# Patient Record
Sex: Female | Born: 1980 | Race: Black or African American | Hispanic: No | Smoking: Never smoker
Health system: Southern US, Community
[De-identification: ages and names within clinical notes are randomized; demographics above are authoritative.]

## PROBLEM LIST (undated history)

## (undated) ENCOUNTER — Inpatient Hospital Stay (HOSPITAL_COMMUNITY): Payer: Self-pay

## (undated) DIAGNOSIS — T4145XA Adverse effect of unspecified anesthetic, initial encounter: Secondary | ICD-10-CM

## (undated) DIAGNOSIS — T8859XA Other complications of anesthesia, initial encounter: Secondary | ICD-10-CM

## (undated) DIAGNOSIS — IMO0002 Reserved for concepts with insufficient information to code with codable children: Secondary | ICD-10-CM

## (undated) DIAGNOSIS — M1990 Unspecified osteoarthritis, unspecified site: Secondary | ICD-10-CM

## (undated) DIAGNOSIS — E282 Polycystic ovarian syndrome: Secondary | ICD-10-CM

## (undated) DIAGNOSIS — K219 Gastro-esophageal reflux disease without esophagitis: Secondary | ICD-10-CM

## (undated) DIAGNOSIS — L309 Dermatitis, unspecified: Secondary | ICD-10-CM

## (undated) DIAGNOSIS — I10 Essential (primary) hypertension: Secondary | ICD-10-CM

## (undated) DIAGNOSIS — D649 Anemia, unspecified: Secondary | ICD-10-CM

## (undated) DIAGNOSIS — R87619 Unspecified abnormal cytological findings in specimens from cervix uteri: Secondary | ICD-10-CM

## (undated) DIAGNOSIS — L80 Vitiligo: Secondary | ICD-10-CM

## (undated) HISTORY — PX: ADENOIDECTOMY: SUR15

## (undated) HISTORY — DX: Other complications of anesthesia, initial encounter: T88.59XA

## (undated) HISTORY — DX: Vitiligo: L80

## (undated) HISTORY — DX: Unspecified abnormal cytological findings in specimens from cervix uteri: R87.619

## (undated) HISTORY — DX: Gastro-esophageal reflux disease without esophagitis: K21.9

## (undated) HISTORY — DX: Adverse effect of unspecified anesthetic, initial encounter: T41.45XA

## (undated) HISTORY — PX: GASTRIC BYPASS: SHX52

## (undated) HISTORY — DX: Reserved for concepts with insufficient information to code with codable children: IMO0002

## (undated) HISTORY — PX: ABDOMINAL HYSTERECTOMY: SHX81

## (undated) HISTORY — PX: DILATION AND CURETTAGE OF UTERUS: SHX78

---
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
## 2005-09-15 ENCOUNTER — Emergency Department: Payer: Self-pay | Admitting: Internal Medicine

## 2006-02-01 ENCOUNTER — Emergency Department: Payer: Self-pay | Admitting: Emergency Medicine

## 2009-01-14 ENCOUNTER — Emergency Department: Payer: Self-pay | Admitting: Emergency Medicine

## 2012-01-19 ENCOUNTER — Emergency Department (HOSPITAL_COMMUNITY)
Admission: EM | Admit: 2012-01-19 | Discharge: 2012-01-19 | Disposition: A | Discharge status: Home / Self Care | Payer: Self-pay | Attending: Emergency Medicine | Admitting: Emergency Medicine

## 2012-01-19 ENCOUNTER — Encounter (HOSPITAL_COMMUNITY): Payer: Self-pay | Admitting: *Deleted

## 2012-01-19 DIAGNOSIS — R1012 Left upper quadrant pain: Secondary | ICD-10-CM | POA: Insufficient documentation

## 2012-01-19 DIAGNOSIS — K219 Gastro-esophageal reflux disease without esophagitis: Secondary | ICD-10-CM | POA: Insufficient documentation

## 2012-01-19 DIAGNOSIS — R1013 Epigastric pain: Secondary | ICD-10-CM | POA: Insufficient documentation

## 2012-01-19 DIAGNOSIS — R112 Nausea with vomiting, unspecified: Secondary | ICD-10-CM | POA: Insufficient documentation

## 2012-01-19 DIAGNOSIS — R109 Unspecified abdominal pain: Secondary | ICD-10-CM

## 2012-01-19 LAB — URINALYSIS, ROUTINE W REFLEX MICROSCOPIC
Glucose, UA: NEGATIVE mg/dL
Hgb urine dipstick: NEGATIVE
Leukocytes, UA: NEGATIVE
Specific Gravity, Urine: 1.016 (ref 1.005–1.030)
pH: 6 (ref 5.0–8.0)

## 2012-01-19 MED ORDER — SUCRALFATE 1 G PO TABS
1.0000 g | ORAL_TABLET | ORAL | Status: AC
  Administered 2012-01-19: 1 g via ORAL
  Filled 2012-01-19: qty 1

## 2012-01-19 MED ORDER — FAMOTIDINE 40 MG PO TABS: 40.0000 mg | ORAL_TABLET | Freq: Once | ORAL | Status: DC

## 2012-01-19 MED ORDER — FAMOTIDINE 20 MG PO TABS
40.0000 mg | ORAL_TABLET | Freq: Once | ORAL | Status: AC
  Administered 2012-01-19: 40 mg via ORAL
  Filled 2012-01-19: qty 2

## 2012-01-19 MED ORDER — GI COCKTAIL ~~LOC~~
30.0000 mL | Freq: Once | ORAL | Status: AC
  Administered 2012-01-19: 30 mL via ORAL
  Filled 2012-01-19: qty 30

## 2012-01-19 MED ORDER — SUCRALFATE 1 G PO TABS: 1.0000 g | ORAL_TABLET | ORAL | Status: DC

## 2012-01-19 NOTE — Discharge Instructions (Signed)
Diet for GERD or PUD  Nutrition therapy can help ease the discomfort of gastroesophageal reflux disease (GERD) and peptic ulcer disease (PUD).   HOME CARE INSTRUCTIONS    Eat your meals slowly, in a relaxed setting.   Eat 5 to 6 small meals per day.   If a food causes distress, stop eating it for a period of time.  FOODS TO AVOID   Coffee, regular or decaffeinated.   Cola beverages, regular or low calorie.   Tea, regular or decaffeinated.   Pepper.   Cocoa.   High fat foods, including meats.   Butter, margarine, hydrogenated oil (trans fats).   Peppermint or spearmint (if you have GERD).   Fruits and vegetables if not tolerated.   Alcohol.   Nicotine (smoking or chewing). This is one of the most potent stimulants to acid production in the gastrointestinal tract.   Any food that seems to aggravate your condition.  If you have questions regarding your diet, ask your caregiver or a registered dietitian.  TIPS   Lying flat may make symptoms worse. Keep the head of your bed raised 6 to 9 inches (15 to 23 cm) by using a foam wedge or blocks under the legs of the bed.   Do not lay down until 3 hours after eating a meal.   Daily physical activity may help reduce symptoms.  MAKE SURE YOU:    Understand these instructions.   Will watch your condition.   Will get help right away if you are not doing well or get worse.  Document Released: 10/11/2005 Document Revised: 09/30/2011 Document Reviewed: 08/27/2011  ExitCare Patient Information 2012 [employer].    Gastroesophageal Reflux Disease, Adult  Gastroesophageal reflux disease (GERD) happens when acid from your stomach flows up into the esophagus. When acid comes in contact with the esophagus, the acid causes soreness (inflammation) in the esophagus. Over time, GERD may create small holes (ulcers) in the lining of the esophagus.  CAUSES    Increased body weight. This puts pressure on the stomach, making acid rise from the stomach into the  esophagus.   Smoking. This increases acid production in the stomach.   Drinking alcohol. This causes decreased pressure in the lower esophageal sphincter (valve or ring of muscle between the esophagus and stomach), allowing acid from the stomach into the esophagus.   Late evening meals and a full stomach. This increases pressure and acid production in the stomach.   A malformed lower esophageal sphincter.  Sometimes, no cause is found.  SYMPTOMS    Burning pain in the lower part of the mid-chest behind the breastbone and in the mid-stomach area. This may occur twice a week or more often.   Trouble swallowing.   Sore throat.   Dry cough.   Asthma-like symptoms including chest tightness, shortness of breath, or wheezing.  DIAGNOSIS   Your caregiver may be able to diagnose GERD based on your symptoms. In some cases, X-rays and other tests may be done to check for complications or to check the condition of your stomach and esophagus.  TREATMENT   Your caregiver may recommend over-the-counter or prescription medicines to help decrease acid production. Ask your caregiver before starting or adding any new medicines.   HOME CARE INSTRUCTIONS    Change the factors that you can control. Ask your caregiver for guidance concerning weight loss, quitting smoking, and alcohol consumption.   Avoid foods and drinks that make your symptoms worse, such as:   Caffeine or   alcoholic drinks.   Chocolate.   Peppermint or mint flavorings.   Garlic and onions.   Spicy foods.   Citrus fruits, such as oranges, lemons, or limes.   Tomato-based foods such as sauce, chili, salsa, and pizza.   Fried and fatty foods.   Avoid lying down for the 3 hours prior to your bedtime or prior to taking a nap.   Eat small, frequent meals instead of large meals.   Wear loose-fitting clothing. Do not wear anything tight around your waist that causes pressure on your stomach.   Raise the head of your bed 6 to 8 inches with wood blocks to  help you sleep. Extra pillows will not help.   Only take over-the-counter or prescription medicines for pain, discomfort, or fever as directed by your caregiver.   Do not take aspirin, ibuprofen, or other nonsteroidal anti-inflammatory drugs (NSAIDs).  SEEK IMMEDIATE MEDICAL CARE IF:    You have pain in your arms, neck, jaw, teeth, or back.   Your pain increases or changes in intensity or duration.   You develop nausea, vomiting, or sweating (diaphoresis).   You develop shortness of breath, or you faint.   Your vomit is green, yellow, black, or looks like coffee grounds or blood.   Your stool is red, bloody, or black.  These symptoms could be signs of other problems, such as heart disease, gastric bleeding, or esophageal bleeding.  MAKE SURE YOU:    Understand these instructions.   Will watch your condition.   Will get help right away if you are not doing well or get worse.  Document Released: 07/21/2005 Document Revised: 09/30/2011 Document Reviewed: 04/30/2011  Exit[employer] Patient Information 2012 [employer].

## 2012-01-19 NOTE — ED Notes (Signed)
Pt is here with mid upper abd pain that started one month ago and then had diarrhea last week.  tp sts then last night had pain with nausea.  LMP- 5 days ago.

## 2012-01-19 NOTE — ED Provider Notes (Signed)
History     CSN: [id number]  Arrival date & time 01/19/12  1205   First MD Initiated Contact with Patient 01/19/12 1537      Chief Complaint  Patient presents with  . Abdominal Pain    (Consider location/radiation/quality/duration/timing/severity/associated sxs/prior treatment) Patient is a 31 y.o. female presenting with abdominal pain. The history is provided by the patient.  Abdominal Pain The primary symptoms of the illness include abdominal pain.   with recurrent epigastric pain radiating to her left upper quadrant. Pain is better with food. No associated fever or chills. Some nausea without vomiting. No vaginal bleeding or discharge. No urinary symptoms. No medications used prior to arrival.  History reviewed. No pertinent past medical history.  Past Surgical History  Procedure Date  . Tonsillectomy     No family history on file.  History  Substance Use Topics  . Smoking status: Never Smoker   . Smokeless tobacco: Not on file  . Alcohol Use: No    OB History    Grav Para Term Preterm Abortions TAB SAB Ect Mult Living                  Review of Systems  Gastrointestinal: Positive for abdominal pain.  All other systems reviewed and are negative.    Allergies  Review of patient's allergies indicates no known allergies.  Home Medications   Current Outpatient Rx  Name Route Sig Dispense Refill  . BIOTIN PO Oral Take 1 tablet by mouth daily.    . MSM PO Oral Take 15 mLs by mouth daily. Mix 1 tablespoon with water.    Marland Kitchen OVER THE COUNTER MEDICATION Oral Take 2 tablets by mouth daily. Vegetarian Multi Vitamin.      BP 144/94  Pulse 78  Temp(Src) 98.1 F (36.7 C) (Oral)  Resp 16  SpO2 98%  Physical Exam  Nursing note and vitals reviewed. Constitutional: She is oriented to person, place, and time. She appears well-developed and well-nourished.  Non-toxic appearance. No distress.  HENT:  Head: Normocephalic and atraumatic.  Eyes: Conjunctivae, EOM and  lids are normal. Pupils are equal, round, and reactive to light.  Neck: Normal range of motion. Neck supple. No tracheal deviation present. No mass present.  Cardiovascular: Normal rate, regular rhythm and normal heart sounds.  Exam reveals no gallop.   No murmur heard. Pulmonary/Chest: Effort normal and breath sounds normal. No stridor. No respiratory distress. She has no decreased breath sounds. She has no wheezes. She has no rhonchi. She has no rales.  Abdominal: Soft. Normal appearance and bowel sounds are normal. She exhibits no distension. There is tenderness in the epigastric area. There is no rigidity, no rebound, no guarding and no CVA tenderness.  Musculoskeletal: Normal range of motion. She exhibits no edema and no tenderness.  Neurological: She is alert and oriented to person, place, and time. She has normal strength. No cranial nerve deficit or sensory deficit. GCS eye subscore is 4. GCS verbal subscore is 5. GCS motor subscore is 6.  Skin: Skin is warm and dry. No abrasion and no rash noted.  Psychiatric: She has a normal mood and affect. Her speech is normal and behavior is normal.    ED Course  Procedures (including critical care time)   Labs Reviewed  URINALYSIS, ROUTINE W REFLEX MICROSCOPIC   No results found.   No diagnosis found.    MDM  Patient given GI cocktail now and she feels better. Patient to be placed on a PPI  and Carafate        Toy Baker, MD 01/19/12 1815

## 2012-01-20 LAB — POCT PREGNANCY, URINE: Preg Test, Ur: NEGATIVE

## 2012-02-11 ENCOUNTER — Ambulatory Visit (INDEPENDENT_AMBULATORY_CARE_PROVIDER_SITE_OTHER): Payer: Self-pay | Admitting: Obstetrics and Gynecology

## 2012-02-11 ENCOUNTER — Encounter: Payer: Self-pay | Admitting: Obstetrics and Gynecology

## 2012-02-11 VITALS — BP 148/97 | HR 56 | Temp 98.5°F | Ht 68.5 in | Wt 317.1 lb

## 2012-02-11 DIAGNOSIS — N979 Female infertility, unspecified: Secondary | ICD-10-CM

## 2012-02-11 DIAGNOSIS — N915 Oligomenorrhea, unspecified: Secondary | ICD-10-CM

## 2012-02-11 DIAGNOSIS — I10 Essential (primary) hypertension: Secondary | ICD-10-CM | POA: Insufficient documentation

## 2012-02-11 DIAGNOSIS — E669 Obesity, unspecified: Secondary | ICD-10-CM

## 2012-02-11 MED ORDER — METFORMIN HCL 500 MG PO TABS: 500.0000 mg | ORAL_TABLET | ORAL | Status: DC

## 2012-02-11 NOTE — Patient Instructions (Signed)
Polycystic Ovarian Syndrome Polycystic ovarian syndrome is a condition with a number of problems. One problem is with the ovaries. The ovaries are organs located in the female pelvis, on each side of the uterus. Usually, during the menstrual cycle, an egg is released from 1 ovary every month. This is called ovulation. When the egg is fertilized, it goes into the womb (uterus), which allows for the growth of a baby. The egg travels from the ovary through the fallopian tube to the uterus. The ovaries also make the hormones estrogen and progesterone. These hormones help the development of a woman's breasts, body shape, and body hair. They also regulate the menstrual cycle and pregnancy. Sometimes, cysts form in the ovaries. A cyst is a fluid-filled sac. On the ovary, different types of cysts can form. The most common type of ovarian cyst is called a functional or ovulation cyst. It is normal, and often forms during the normal menstrual cycle. Each month, a woman's ovaries grow tiny cysts that hold the eggs. When an egg is fully grown, the sac breaks open. This releases the egg. Then, the sac which released the egg from the ovary dissolves. In one type of functional cyst, called a follicle cyst, the sac does not break open to release the egg. It may actually continue to grow. This type of cyst usually disappears within 1 to 3 months.  One type of cyst problem with the ovaries is called Polycystic Ovarian Syndrome (PCOS). In this condition, many follicle cysts form, but do not rupture and produce an egg. This health problem can affect the following:  Menstrual cycle.   Heart.   Obesity.   Cancer of the uterus.   Fertility.   Blood vessels.   Hair growth (face and body) or baldness.   Hormones.   Appearance.   High blood pressure.   Stroke.   Insulin production.   Inflammation of the liver.   Elevated blood cholesterol and triglycerides.  CAUSES   No one knows the exact cause of PCOS.    Women with PCOS often have a mother or sister with PCOS. There is not yet enough proof to say this is inherited.   Many women with PCOS have a weight problem.   Researchers are looking at the relationship between PCOS and the body's ability to make insulin. Insulin is a hormone that regulates the change of sugar, starches, and other food into energy for the body's use, or for storage. Some women with PCOS make too much insulin. It is possible that the ovaries react by making too many female hormones, called androgens. This can lead to acne, excessive hair growth, weight gain, and ovulation problems.   Too much production of luteinizing hormone (LH) from the pituitary gland in the brain stimulates the ovary to produce too much female hormone (androgen).  SYMPTOMS   Infrequent or no menstrual periods, and/or irregular bleeding.   Inability to get pregnant (infertility), because of not ovulating.   Increased growth of hair on the face, chest, stomach, back, thumbs, thighs, or toes.   Acne, oily skin, or dandruff.   Pelvic pain.   Weight gain or obesity, usually carrying extra weight around the waist.   Type 2 diabetes (this is the diabetes that usually does not need insulin).   High cholesterol.   High blood pressure.   Female-pattern baldness or thinning hair.   Patches of thickened and dark brown or black skin on the neck, arms, breasts, or thighs.   Skin tags,   or tiny excess flaps of skin, in the armpits or neck area.   Sleep apnea (excessive snoring and breathing stops at times while asleep).   Deepening of the voice.   Gestational diabetes when pregnant.   Increased risk of miscarriage with pregnancy.  DIAGNOSIS  There is no single test to diagnose PCOS.   Your caregiver will:   Take a medical history.   Perform a pelvic exam.   Perform an ultrasound.   Check your female and female hormone levels.   Measure glucose or sugar levels in the blood.   Do other blood  tests.   If you are producing too many female hormones, your caregiver will make sure it is from PCOS. At the physical exam, your caregiver will want to evaluate the areas of increased hair growth. Try to allow natural hair growth for a few days before the visit.   During a pelvic exam, the ovaries may be enlarged or swollen by the increased number of small cysts. This can be seen more easily by vaginal ultrasound or screening, to examine the ovaries and lining of the uterus (endometrium) for cysts. The uterine lining may become thicker, if there has not been a regular period.  TREATMENT  Because there is no cure for PCOS, it needs to be managed to prevent problems. Treatments are based on your symptoms. Treatment is also based on whether you want to have a baby or whether you need contraception.  Treatment may include:  Progesterone hormone, to start a menstrual period.   Birth control pills, to make you have regular menstrual periods.   Medicines to make you ovulate, if you want to get pregnant.   Medicines to control your insulin.   Medicine to control your blood pressure.   Medicine and diet, to control your high cholesterol and triglycerides in your blood.   Surgery, making small holes in the ovary, to decrease the amount of female hormone production. This is done through a long, lighted tube (laparoscope), placed into the pelvis through a tiny incision in the lower abdomen.  Your caregiver will go over some of the choices with you. WOMEN WITH PCOS HAVE THESE CHARACTERISTICS:  High levels of female hormones called androgens.   An irregular or no menstrual cycle.   May have many small cysts in their ovaries.  PCOS is the most common hormonal reproductive problem in women of childbearing age. WHY DO WOMEN WITH PCOS HAVE TROUBLE WITH THEIR MENSTRUAL CYCLE? Each month, about 20 eggs start to mature in the ovaries. As one egg grows and matures, the follicle breaks open to release the egg,  so it can travel through the fallopian tube for fertilization. When the single egg leaves the follicle, ovulation takes place. In women with PCOS, the ovary does not make all of the hormones it needs for any of the eggs to fully mature. They may start to grow and accumulate fluid, but no one egg becomes large enough. Instead, some may remain as cysts. Since no egg matures or is released, ovulation does not occur and the hormone progesterone is not made. Without progesterone, a woman's menstrual cycle is irregular or absent. Also, the cysts produce female hormones, which continue to prevent ovulation.  Document Released: 02/04/2005 Document Revised: 09/30/2011 Document Reviewed: 08/29/2009 [employer] Patient Information 2012 [employer]. 

## 2012-02-11 NOTE — Progress Notes (Signed)
Brought "my prescription for good health " bp at [facility] 135/81

## 2012-02-11 NOTE — Progress Notes (Addendum)
Patient ID: Vanessa Braun, female     DOB: 1981-04-13, 31 y.o.   MRN: [id number] Vanessa Braun [id number]  Chief Complaint  Patient presents with  . Follow-up    possible PCOS seen at [facility]        SUBJECTIVE  HPI: This is the initial visit for patient she was seen 01/07/2012 at [facility] for annual GYN visit with Pap and advised to come here for evaluation for possible PCOS. States she had a fasting blood glucose of 87 at that visit. Menstrual history 13x 30x5-7; followed by menstrual irregularity described as heavy flow every 3-5 months for several years. She was on Depo-Provera for about a year ending in 2009. Was then amenorrheic for 5 months. Since then cycle intervals have been approximately 50 days. LMP and 02/01/2012 seven-day flow. And her husband have been attempting to get pregnant for the past 3 years. Has no other children and has had no semen analysis. She has not been diagnosed before as hypertensive however she has been told her blood pressures are "a little high." A t [facility] visit 01/19/2012  BP 144/94. She states she plucks some chin hairs but does not have a problem with hirsutism. He is interested in losing weight and plans to increase exercise.  Past Medical History  Diagnosis Date  . GERD (gastroesophageal reflux disease)   . Abnormal Pap smear    Past Surgical History  Procedure Date  . Tonsillectomy and adenoidectomy 1999   History   Social History  . Marital Status: Married    Spouse Name: N/A    Number of Children: N/A  . Years of Education: N/A   Occupational History  . Not on file.   Social History Main Topics  . Smoking status: Never Smoker   . Smokeless tobacco: Never Used  . Alcohol Use: No  . Drug Use: No  . Sexually Active: Yes    Birth Control/ Protection: None   Other Topics Concern  . Not on file   Social History Narrative  . No narrative on file   Current Outpatient Prescriptions on File Prior to Visit  Medication Sig Dispense Refill  .  BIOTIN PO Take 1 tablet by mouth daily.      . famotidine (PEPCID) 40 MG tablet Take 1 tablet (40 mg total) by mouth once.  30 tablet  0  . Methylsulfonylmethane (MSM PO) Take 15 mLs by mouth daily. Mix 1 tablespoon with water.      Marland Kitchen OVER THE COUNTER MEDICATION Take 2 tablets by mouth daily. Vegetarian Multi Vitamin.      . metFORMIN (GLUCOPHAGE) 500 MG tablet Take 1 tablet (500 mg total) by mouth 1 day or 1 dose.  30 tablet  3   No Known Allergies  ROS: Pertinent items in HPI  OBJECTIVE Blood pressure 148/97, pulse 56, temperature 98.5 F (36.9 C), temperature source Oral, height 5' 8.5" (1.74 m), weight 317 lb 1.6 oz (143.836 kg), last menstrual period 02/01/2012. GENERAL: Well-developed, well-nourished female in no acute distress.  ABDOMEN: Soft, nontender, truncal obesity, no hirsutism EXTREMITIES: Nontender, no edema BIMANUAL: cervix post L/C; uterus not able to outine due to body habitus, NT; no adnexal tenderness or masses appreciated    ASSESSMENT 1. Morbid Obesity 2. CHTN new dx 3. Oligomenorrhea 4. Primary Infertility  PLAN D/W Dr. Marice Potter -> will check TSH and total free testosterone today and prescribe Metformin to start at 500 mg 1/d and gradually increase to 1 gm bid Will refer to [facility] to  get PMD to manage HTN Discussed weight loss, diet, exercise Advised to have husband get semenanalysis F/U here in 2 months and keep menstrual calendar   ROI record [facility]    Vanessa Braun 02/11/2012 9:36 AM

## 2012-02-14 LAB — TESTOSTERONE, FREE, TOTAL, SHBG
Sex Hormone Binding: 31 nmol/L (ref 18–114)
Testosterone, Free: 8 pg/mL — ABNORMAL HIGH (ref 0.6–6.8)
Testosterone-% Free: 1.9 % (ref 0.4–2.4)
Testosterone: 42.84 ng/dL (ref 10–70)

## 2012-02-16 ENCOUNTER — Telehealth: Payer: Self-pay | Admitting: *Deleted

## 2012-02-16 NOTE — Telephone Encounter (Signed)
Pt called requesting lab results. Will forward to provider for review.

## 2012-02-21 ENCOUNTER — Telehealth: Payer: Self-pay

## 2012-02-21 NOTE — Telephone Encounter (Signed)
Pt called the front desk and call was transferred to me.  Pt wanted to know her results I informed her that her results does coincide with PCOS and to make sure she continues the metformin regimen as Deidre prescribed in prior visit.  Pt shared a concern for the Rx of clomid and I advised pt that the metformin should keep her hormones stable and that if she needed to make an infertility appt it would cost 250 up front.  But to stick with regimen and follow up in 2 months.  Pt stated understanding and had no further questions.

## 2012-02-25 NOTE — Telephone Encounter (Signed)
I called her re: results. Will take the Metformin and RTC 2 months

## 2012-03-21 ENCOUNTER — Other Ambulatory Visit: Payer: Self-pay | Admitting: Obstetrics and Gynecology

## 2012-03-24 ENCOUNTER — Other Ambulatory Visit: Payer: Self-pay | Admitting: *Deleted

## 2012-03-24 DIAGNOSIS — I10 Essential (primary) hypertension: Secondary | ICD-10-CM

## 2012-03-24 DIAGNOSIS — N915 Oligomenorrhea, unspecified: Secondary | ICD-10-CM

## 2012-03-24 NOTE — Telephone Encounter (Signed)
May refill metformin 6 refills

## 2012-03-24 NOTE — Telephone Encounter (Signed)
Patient came to  Clinic states needs refill on metformin because will run out of it over the weekend, pt now taking 2 tablets bid,   Per chart review seen by Leola Brazil , discussed with Dr. Debroah Loop and sent to him for review and refill if approved.will notify patient if not approved.  Patient voices understanding,

## 2012-03-25 MED ORDER — METFORMIN HCL 500 MG PO TABS: 500.0000 mg | ORAL_TABLET | Freq: Two times a day (BID) | ORAL | Status: DC

## 2012-03-31 ENCOUNTER — Encounter: Payer: Self-pay | Admitting: Obstetrics and Gynecology

## 2012-04-13 ENCOUNTER — Ambulatory Visit (INDEPENDENT_AMBULATORY_CARE_PROVIDER_SITE_OTHER): Payer: Self-pay | Admitting: Advanced Practice Midwife

## 2012-04-13 VITALS — BP 124/78 | HR 63 | Temp 97.5°F | Wt 313.4 lb

## 2012-04-13 DIAGNOSIS — N979 Female infertility, unspecified: Secondary | ICD-10-CM

## 2012-04-13 DIAGNOSIS — I10 Essential (primary) hypertension: Secondary | ICD-10-CM

## 2012-04-13 DIAGNOSIS — N915 Oligomenorrhea, unspecified: Secondary | ICD-10-CM

## 2012-04-13 MED ORDER — MEDROXYPROGESTERONE ACETATE 10 MG PO TABS: 10.0000 mg | ORAL_TABLET | Freq: Every day | ORAL | Status: DC

## 2012-04-13 MED ORDER — METFORMIN HCL 500 MG PO TABS: 1000.0000 mg | ORAL_TABLET | Freq: Two times a day (BID) | ORAL | Status: DC

## 2012-04-13 MED ORDER — CLOMIPHENE CITRATE 50 MG PO TABS: 50.0000 mg | ORAL_TABLET | Freq: Every day | ORAL | Status: AC

## 2012-04-13 NOTE — Patient Instructions (Addendum)
[facility] [phone]  Start charting your cycles to identify when or if you are ovulating.   Infertility WHAT IS INFERTILITY?  Infertility is usually defined as not being able to get pregnant after trying for one year of regular sexual intercourse without the use of contraceptives. Or not being able to carry a pregnancy to term and have a baby. The infertility rate in the [place] is around 10%. Pregnancy is the result of a chain of events. A woman must release an egg from one of her ovaries (ovulation). The egg must be fertilized by the female sperm. Then it travels through a fallopian tube into the uterus (womb), where it attaches to the wall of the uterus and grows. A man must have enough sperm, and the sperm must join with (fertilize) the egg along the way, at the proper time. The fertilized egg must then become attached to the inside of the uterus. While this may seem simple, many things can happen to prevent pregnancy from occurring.  WHOSE PROBLEM IS IT?  About 20% of infertility cases are due to problems with the man (female factors) and 65% are due to problems with the woman (female factors). Other cases are due to a combination of female and female factors or to unknown causes.  WHAT CAUSES INFERTILITY IN MEN?  Infertility in men is often caused by problems with making enough normal sperm or getting the sperm to reach the egg. Problems with sperm may exist from birth or develop later in life, due to illness or injury. Some men produce no sperm, or produce too few sperm (oligospermia). Other problems include:  Sexual dysfunction.   Hormonal or endocrine problems.   Age. Female fertility decreases with age, but not at as young an age as female fertility.   Infection.   Congenital problems. Birth defect, such as absence of the tubes that carry the sperm (vas deferens).   Genetic/chromosomal problems.   Antisperm antibody problems.   Retrograde ejaculation (sperm go into the  bladder).   Varicoceles, spematoceles, or tumors of the testicles.   Lifestyle can influence the number and quality of a man's sperm.   Alcohol and drugs can temporarily reduce sperm quality.   Environmental toxins, including pesticides and lead, may cause some cases of infertility in men.  WHAT CAUSES INFERTILITY IN WOMEN?   Problems with ovulation account for most infertility in women. Without ovulation, eggs are not available to be fertilized.   Signs of problems with ovulation include irregular menstrual periods or no periods at all.   Simple lifestyle factors, including stress, diet, or athletic training, can affect a woman's hormonal balance.   Age. Fertility begins to decrease in women in the early 62s and is worse after age 44.   Much less often, a hormonal imbalance from a serious medical problem, such as a pituitary gland tumor, thyroid or other chronic medical disease, can cause ovulation problems.   Pelvic infections.   Polycystic ovary syndrome (increase in female hormones, unable to ovulate).   Alcohol or illegal drugs.   Environmental toxins, radiation, pesticides, and certain chemicals.   Aging is an important factor in female infertility.   The ability of a woman's ovaries to produce eggs declines with age, especially after age 28. About one third of couples where the woman is over 35 will have problems with fertility.   By the time she reaches menopause when her monthly periods stop for good, a woman can no longer produce eggs or become  pregnant.   Other problems can also lead to infertility in women. If the fallopian tubes are blocked at one or both ends, the egg cannot travel through the tubes into the uterus. Scar tissue (adhesions) in the pelvis may cause blocked tubes. This may result from pelvic inflammatory disease, endometriosis, or surgery for an ectopic pregnancy (fertilized egg implanted outside the uterus) or any pelvic or abdominal surgery causing  adhesions.   Fibroid tumors or polyps of the uterus.   Congenital (birth defect) abnormalities of the uterus.   Infection of the cervix (cervicitis).   Cervical stenosis (narrowing).   Abnormal cervical mucus.   Polycystic ovary syndrome.   Having sexual intercourse too often (every other day or 4 to 5 times a week).   Obesity.   Anorexia.   Poor nutrition.   Over exercising, with loss of body fat.   DES. Your mother received diethylstilbesterol hormone when pregnant with you.  HOW IS INFERTILITY TESTED?  If you have been trying to have a baby without success, you may want to seek medical help. You should not wait for one year of trying before seeing a health care provider if:  You are over 35.   You have reason to believe that there may be a fertility problem.  A medical evaluation may determine the reasons for a couple's infertility. Usually this process begins with:  Physical exams.   Medical histories of both partners.   Sexual histories of both partners.  If there is no obvious problem, like improperly timed intercourse or absence of ovulation, tests may be needed.   For a man, testing usually begins with tests of his semen to look at:   The number of sperm.   The shape of sperm.   Movement of his sperm.   Taking a complete medical and surgical history.   Physical examination.   Check for infection of the female reproductive organs.  Sometimes hormone tests are done.   For a woman, the first step in testing is to find out if she is ovulating each month. There are several ways to do this. For example, she can keep track of changes in her morning body temperature and in the texture of her cervical mucus. Another tool is a home ovulation test kit, which can be bought at drug or grocery stores.   Checks of ovulation can also be done in the doctor's office, using blood tests for hormone levels or ultrasound tests of the ovaries. If the woman is ovulating, more  tests will need to be done. Some common female tests include:   Hysterosalpingogram: An x-ray of the fallopian tubes and uterus after they are injected with dye. It shows if the tubes are open and shows the shape of the uterus.   Laparoscopy: An exam of the tubes and other female organs for disease. A lighted tube called a laparoscope is used to see inside the abdomen.   Endometrial biopsy: Sample of uterus tissue taken on the first day of the menstrual period, to see if the tissue indicates you are ovulating.   Transvaginal ultrasound: Examines the female organs.   Hysteroscopy: Uses a lighted tube to examine the cervix and inside the uterus, to see if there are any abnormalities inside the uterus.  TREATMENT  Depending on the test results, different treatments can be suggested. The type of treatment depends on the cause. 85 to 90% of infertility cases are treated with drugs or surgery.   Various fertility drugs may be  used for women with ovulation problems. It is important to talk with your caregiver about the drug to be used. You should understand the drug's benefits and side effects. Depending on the type of fertility drug and the dosage of the drug used, multiple births (twins or multiples) can occur in some women.   If needed, surgery can be done to repair damage to a woman's ovaries, fallopian tubes, cervix, or uterus.   Surgery or medical treatment for endometriosis or polycystic ovary syndrome. Sometimes a man has an infertility problem that can be corrected with medicine or by surgery.   Intrauterine insemination (IUI) of sperm, timed with ovulation.   Change in lifestyle, if that is the cause (lose weight, increase exercise, and stop smoking, drinking excessively, or taking illegal drugs).   Other types of surgery:   Removing growths inside and on the uterus.   Removing scar tissue from inside of the uterus.   Fixing blocked tubes.   Removing scar tissue in the pelvis and  around the female organs.  WHAT IS ASSISTED REPRODUCTIVE TECHNOLOGY (ART)?  Assisted reproductive technology (ART) is another form of special methods used to help infertile couples. ART involves handling both the woman's eggs and the man's sperm. Success rates vary and depend on many factors. ART can be expensive and time-consuming. But ART has made it possible for many couples to have children that otherwise would not have been conceived. Some methods are listed below:  In vitro fertilization (IVF). IVF is often used when a woman's fallopian tubes are blocked or when a man has low sperm counts. A drug is used to stimulate the ovaries to produce multiple eggs. Once mature, the eggs are removed and placed in a culture dish with the man's sperm for fertilization. After about 40 hours, the eggs are examined to see if they have become fertilized by the sperm and are dividing into cells. These fertilized eggs (embryos) are then placed in the woman's uterus. This bypasses the fallopian tubes.   Gamete intrafallopian transfer (GIFT) is similar to IVF, but used when the woman has at least one normal fallopian tube. Three to five eggs are placed in the fallopian tube, along with the man's sperm, for fertilization inside the woman's body.   Zygote intrafallopian transfer (ZIFT), also called tubal embryo transfer, combines IVF and GIFT. The eggs retrieved from the woman's ovaries are fertilized in the lab and placed in the fallopian tubes rather than in the uterus.   ART procedures sometimes involve the use of donor eggs (eggs from another woman) or previously frozen embryos. Donor eggs may be used if a woman has impaired ovaries or has a genetic disease that could be passed on to her baby.   When performing ART, you are at higher risk for resulting in multiple pregnancies, twins, triplets or more.   Intracytoplasma sperm injection is a procedure that injects a single sperm into the egg to fertilize it.    Embryo transplant is a procedure that starts after growing an embryo in a special media (chemical solution) developed to keep the embryo alive for 2 to 5 days, and then transplanting it into the uterus.  In cases where a cause cannot be found and pregnancy does not occur, adoption may be a consideration. Document Released: 10/14/2003 Document Revised: 09/30/2011 Document Reviewed: 09/09/2009 [facility] Patient Information 2012 [place], [place].  [facility] [address], [place], [place] [address]   [phone]  We are pleased to  offer you the following services as part of evaluation and management of infertility.  Please note that we are specialized in general obstetrics and gynecology, and we are not infertility specialists who had to undergo additional years of training.  If you desire, we can give you an outright referral to an infertility specialist.  Prior to initiating any therapy for infertility, we require that you undergo a hysterosalpingogram (HSG) to evaluate your reproductive system and your female partner has to undergo a semen analysis.  These results need to be received by our clinic before we recommend any treatments.  The hysterosalpingogram and semen analysis can be performed at the following places; prices are approximate and are subject to change:  1) [facility] Semen Analysis: Not offered Hysterosalpingogram: Facility Fee $600  Reading Fee $230 This is the discounted self-pay price; needs to be paid up front.  2) [facility]   [phone]   [address] [facility] [address], [place] [address] Semen Analysis: [phone] Hysterosalpingogram: Performed at [facility] Facility Fee [phone]  Reading Fee $305     Self pay patients get 20% of the reading fee and 40% discount off the facility fee if bill is is paid up front  3) [facility] [phone]  [address]  [place], [place] [address] Semen Analysis:  $120 Hysterosalpingogram: Performed in their facility  Facility Fee $1200  Reading Fee [phone]  Self pay patients get 40% discount off the facility fee if bill is is paid up front  After these results are obtained; we will evaluate them and recommend appropriate subsequent therapy which may involving medications and/or surgery. There is a $200 fee for initial consultation; and $100 fee per visit for any subsequent visits.  Thank you for choosing [place] and our services.  Please call [phone] for any further questions or concerns regarding infertility management or other gynecologic issues.

## 2012-04-13 NOTE — Progress Notes (Signed)
Subjective:    Vanessa Braun is a 31 y.o. female who presents for follow-up of basic infertility evaluation and requesting initiation of Clomid. She has been taking Metformin for 2 months. Patient and partner have been attempting conception for 3 years. Marital status: married. Pregnancies with current partner: no.  Menstrual and Endocrine History LMP Patient's last menstrual period was 04/10/2012.   Obstetrical History Never pregnant  Gyn History: See previous note.  Infertility and Endocrine Studies Basal body temperature no  Endo with biopsy no  Hysterosalpingogram no  Post-coital test no  Laparoscopy no  Hormonal studies no  Semen analysis no  Other studies yes  Medications glucophage  Other therapies nane  Insemination no   Sexual History Frequency 2 or 3 times per week  Dyspareunia no   Contraception None   Family History Thyroid problems  no  Heart condition or high blood pressure  yes  Blood clot or stroke  no  Diabetes  no  Cancer  no  Birth defects/inherited diseases  no  Infectious diseases (mumps, TB, rubella)  no  Other medical problems  obesity   The following portions of the patient's history were reviewed and updated as appropriate: allergies, current medications, past family history, past medical history, past social history, past surgical history and problem list.  Review of Systems see previous note   Partners Hx unknown. Has not had semen analysis. Pt states he attributes the infertility entirely to her.     Objective:    Female Exam BP 124/78  Pulse 63  Temp 97.5 F (36.4 C) (Oral)  Wt 313 lb 6.4 oz (142.157 kg)  LMP 04/10/2012 Wt Readings from Last 1 Encounters:  04/13/12 313 lb 6.4 oz (142.157 kg)   BMI: There is no height on file to calculate BMI. No exam performed today, not needed.  Results for ALIANI, CACCAVALE (MRN [id number]) as of 04/13/2012 17:45  Ref. Range 02/11/2012 09:36  Sex Hormone Binding Latest Range: 18-114 nmol/L  31  Testosterone Latest Range: 10-70 ng/dL 78.29  Testosterone-% Freee. Latest Range: 0.4-2.4 % 1.9  Testosterone Free Latest Range: 0.6-6.8 pg/mL 8.0 (H)  TSH Latest Range: 0.350-4.500 uIU/mL 1.902    Assessment:    Primary infertility, Irregular periods, Obesity due to  .   Plan:  Consulted w/ Dr. Macon Large Rx Clomiphene and Provera Take days 5-10 of cycle. If no cycle following month, take UPT. If negative, take Provera x 10 days and take Clomid on days 5-10 of withdrawal bleed.  Instructions for basal body temp charting reviewed. All questions answered. Educational materials given to patient. Follow up in 3 months. w/ Dr. Macon Large Consider HSG if no pregnancy in next three cycles. Strongly recommended semen analysis. Price and reference list given.  [place], [place] 04/13/2012 5:47 PM

## 2012-04-14 ENCOUNTER — Encounter: Payer: Self-pay | Admitting: Advanced Practice Midwife

## 2012-05-15 ENCOUNTER — Telehealth: Payer: Self-pay | Admitting: *Deleted

## 2012-05-15 NOTE — Telephone Encounter (Signed)
Pt left message stating that she has questions about taking metformin, clomid and provera.

## 2012-05-16 NOTE — Telephone Encounter (Signed)
Returned call to pt and left message that I was calling to answer her medication questions. Please leave a new message if there is a particularly good time to call her back.

## 2012-05-17 NOTE — Telephone Encounter (Signed)
Spoke w/pt and discussed her meds. She is taking metformin as directed. She used clomid as directed after LMP 04/10/12. She has not had a period and has taken a home UPT twice which is negative. She was confused about what is the next step. I instructed pt to take provera as prescribed x10 days. When her period starts after provera is finished, she will again take clomid on days 5-9. If no period returns in 1 month, she should repeat home UPT. If the UPT is negative, she may repeat the cycle of provera and clomid. Pt voiced understanding.

## 2012-05-17 NOTE — Telephone Encounter (Signed)
Returned call to patient and left message that this is our second attempt calling her back about her medication questions.

## 2012-05-23 ENCOUNTER — Emergency Department (HOSPITAL_COMMUNITY)
Admission: EM | Admit: 2012-05-23 | Discharge: 2012-05-23 | Disposition: A | Discharge status: Home / Self Care | Payer: Self-pay | Attending: Emergency Medicine | Admitting: Emergency Medicine

## 2012-05-23 ENCOUNTER — Encounter (HOSPITAL_COMMUNITY): Payer: Self-pay

## 2012-05-23 ENCOUNTER — Emergency Department (INDEPENDENT_AMBULATORY_CARE_PROVIDER_SITE_OTHER)
Admission: EM | Admit: 2012-05-23 | Discharge: 2012-05-23 | Disposition: A | Discharge status: Home / Self Care | Payer: Self-pay | Source: Home / Self Care

## 2012-05-23 ENCOUNTER — Ambulatory Visit (HOSPITAL_COMMUNITY): Admit: 2012-05-23 | Payer: Self-pay

## 2012-05-23 DIAGNOSIS — K219 Gastro-esophageal reflux disease without esophagitis: Secondary | ICD-10-CM | POA: Insufficient documentation

## 2012-05-23 DIAGNOSIS — M79609 Pain in unspecified limb: Secondary | ICD-10-CM

## 2012-05-23 DIAGNOSIS — M79605 Pain in left leg: Secondary | ICD-10-CM

## 2012-05-23 HISTORY — DX: Polycystic ovarian syndrome: E28.2

## 2012-05-23 LAB — CBC WITH DIFFERENTIAL/PLATELET
Basophils Relative: 0 % (ref 0–1)
Eosinophils Absolute: 0.1 10*3/uL (ref 0.0–0.7)
Eosinophils Relative: 1 % (ref 0–5)
Lymphs Abs: 3.7 10*3/uL (ref 0.7–4.0)
MCH: 30 pg (ref 26.0–34.0)
MCHC: 33.5 g/dL (ref 30.0–36.0)
MCV: 89.4 fL (ref 78.0–100.0)
Neutrophils Relative %: 55 % (ref 43–77)
Platelets: 217 10*3/uL (ref 150–400)
RBC: 4.17 MIL/uL (ref 3.87–5.11)

## 2012-05-23 LAB — COMPREHENSIVE METABOLIC PANEL
ALT: 23 U/L (ref 0–35)
Albumin: 3.6 g/dL (ref 3.5–5.2)
BUN: 10 mg/dL (ref 6–23)
Calcium: 9.2 mg/dL (ref 8.4–10.5)
GFR calc Af Amer: >90 mL/min (ref >90)
Glucose, Bld: 97 mg/dL (ref 70–99)
Potassium: 4.4 mEq/L (ref 3.5–5.1)
Sodium: 138 mEq/L (ref 135–145)
Total Protein: 7.3 g/dL (ref 6.0–8.3)

## 2012-05-23 LAB — D-DIMER, QUANTITATIVE: D-Dimer, Quant: 0.45 ug/mL-FEU (ref 0.00–0.48)

## 2012-05-23 MED ORDER — CYCLOBENZAPRINE HCL 5 MG PO TABS: 5.0000 mg | ORAL_TABLET | Freq: Three times a day (TID) | ORAL | Status: DC | PRN

## 2012-05-23 MED ORDER — MELOXICAM 15 MG PO TABS: 15.0000 mg | ORAL_TABLET | Freq: Every day | ORAL | Status: DC

## 2012-05-23 NOTE — ED Notes (Signed)
Pt. States HA with blurred vision. Rosenbaum visual acuity done.

## 2012-05-23 NOTE — ED Provider Notes (Signed)
History     CSN: [id number]  Arrival date & time 05/23/12  1306   First MD Initiated Contact with Patient 05/23/12 1645      Chief Complaint  Patient presents with  . Leg Pain  . Blurred Vision    (Consider location/radiation/quality/duration/timing/severity/associated sxs/prior treatment) Patient is a 31 y.o. female presenting with leg pain. The history is provided by the patient.  Leg Pain  The incident occurred yesterday. The incident occurred at work. There was no injury mechanism. The pain is present in the left leg. The quality of the pain is described as aching. The pain is mild. The pain has been intermittent since onset. Pertinent negatives include no numbness, no inability to bear weight, no loss of motion, no muscle weakness, no loss of sensation and no tingling. She reports no foreign bodies present. The symptoms are aggravated by bearing weight. She has tried nothing for the symptoms. The treatment provided no relief.    Past Medical History  Diagnosis Date  . GERD (gastroesophageal reflux disease)   . Abnormal Pap smear   . PCOS (polycystic ovarian syndrome)     Past Surgical History  Procedure Date  . Tonsillectomy and adenoidectomy 1999  . Adenoidectomy     Family History  Problem Relation Age of Onset  . Diabetes Mother   . Hyperlipidemia Mother   . Hypertension Mother   . Diabetes Father   . Kidney disease Father   . Hypertension Father   . Gestational diabetes Sister   . Depression Sister     History  Substance Use Topics  . Smoking status: Never Smoker   . Smokeless tobacco: Never Used  . Alcohol Use: No    OB History    Grav Para Term Preterm Abortions TAB SAB Ect Mult Living   0 0 0 0 0 0 0 0 0 0       Review of Systems  Constitutional: Negative for fever, chills, diaphoresis and fatigue.  HENT: Negative for ear pain, congestion, sore throat, facial swelling, mouth sores, trouble swallowing, neck pain and neck stiffness.   Eyes:  Negative.   Respiratory: Negative for apnea, cough, chest tightness, shortness of breath and wheezing.   Cardiovascular: Negative for chest pain, palpitations and leg swelling.  Gastrointestinal: Negative for nausea, vomiting, abdominal pain, diarrhea and abdominal distention.  Genitourinary: Negative for hematuria, flank pain, vaginal discharge, difficulty urinating and menstrual problem.  Musculoskeletal: Negative for back pain, joint swelling and gait problem.  Skin: Negative for rash and wound.  Neurological: Negative for dizziness, tingling, tremors, seizures, syncope, facial asymmetry, numbness and headaches.  Psychiatric/Behavioral: Negative.   All other systems reviewed and are negative.    Allergies  Review of patient's allergies indicates no known allergies.  Home Medications   Current Outpatient Rx  Name Route Sig Dispense Refill  . FAMOTIDINE 40 MG PO TABS Oral Take 40 mg by mouth 2 (two) times daily as needed. indigestion    . MEDROXYPROGESTERONE ACETATE 10 MG PO TABS Oral Take 1 tablet (10 mg total) by mouth daily. 10 tablet 3  . METFORMIN HCL 500 MG PO TABS Oral Take 2 tablets (1,000 mg total) by mouth 2 (two) times daily with a meal. 120 tablet 6  . OVER THE COUNTER MEDICATION Oral Take 2 tablets by mouth daily. Vegetarian Multi Vitamin.      BP 119/73  Pulse 51  Temp 98.3 F (36.8 C) (Oral)  Resp 14  SpO2 100%  LMP 04/10/2012  Physical Exam  Nursing note and vitals reviewed. Constitutional: She is oriented to person, place, and time. She appears well-developed and well-nourished. No distress.  HENT:  Head: Normocephalic and atraumatic.  Right Ear: External ear normal.  Left Ear: External ear normal.  Nose: Nose normal.  Mouth/Throat: Oropharynx is clear and moist. No oropharyngeal exudate.  Eyes: Conjunctivae and EOM are normal. Pupils are equal, round, and reactive to light. Right eye exhibits no discharge. Left eye exhibits no discharge.  Neck: Normal  range of motion. Neck supple. No JVD present. No tracheal deviation present. No thyromegaly present.  Cardiovascular: Normal rate, regular rhythm, normal heart sounds and intact distal pulses.  Exam reveals no gallop and no friction rub.   No murmur heard. Pulmonary/Chest: Effort normal and breath sounds normal. No respiratory distress. She has no wheezes. She has no rales. She exhibits no tenderness.  Abdominal: Soft. Bowel sounds are normal. She exhibits no distension. There is no tenderness. There is no rebound and no guarding.  Musculoskeletal: Normal range of motion.       Left lower leg: She exhibits tenderness. She exhibits no bony tenderness, no swelling, no edema and no deformity.       Patient with mild pain with palpation over the lateral aspect of the left leg but no corresponding cords consistent with DVT no swelling compared to avoid  Lymphadenopathy:    She has no cervical adenopathy.  Neurological: She is alert and oriented to person, place, and time. No cranial nerve deficit. Coordination normal.  Skin: Skin is warm. No rash noted. She is not diaphoretic.  Psychiatric: She has a normal mood and affect. Her behavior is normal. Judgment and thought content normal.    ED Course  Procedures (including critical care time)  Labs Reviewed  COMPREHENSIVE METABOLIC PANEL - Abnormal; Notable for the following:    Total Bilirubin 0.2 (*)     GFR calc non Af Amer 81 (*)     All other components within normal limits  CBC WITH DIFFERENTIAL  D-DIMER, QUANTITATIVE  URINALYSIS, ROUTINE W REFLEX MICROSCOPIC   No results found.   1. Left leg pain       MDM  31 year old female patient with past medical history of PCOS presents with left leg pain. Patient says she started noticing pain in the lateral aspect of the left leg after standing for a long duration at work yesterday. Patient is able to bear weight there is no redness swelling difference in circumference from left leg her  right leg no palpable DVT use no tachycardia nausea vomiting chest pain. Patient's wells score for DVT 0. Patient seen by physician concern for possible DVT, d-dimer was ordered to evaluate for evidence in a patient with low risk. D-dimer was negative. Likely pain musculoskeletal in etiology as patient appears well is able to await. Patient encouraged followup with primary care physician for reevaluation  Results for orders placed during the hospital encounter of 05/23/12  CBC WITH DIFFERENTIAL      Component Value Range   WBC 9.6  4.0 - 10.5 K/uL   RBC 4.17  3.87 - 5.11 MIL/uL   Hemoglobin 12.5  12.0 - 15.0 g/dL   HCT 04.5  40.9 - 81.1 %   MCV 89.4  78.0 - 100.0 fL   MCH 30.0  26.0 - 34.0 pg   MCHC 33.5  30.0 - 36.0 g/dL   RDW 91.4  78.2 - 95.6 %   Platelets 217  150 - 400 K/uL   Neutrophils  Relative 55  43 - 77 %   Neutro Abs 5.3  1.7 - 7.7 K/uL   Lymphocytes Relative 39  12 - 46 %   Lymphs Abs 3.7  0.7 - 4.0 K/uL   Monocytes Relative 5  3 - 12 %   Monocytes Absolute 0.5  0.1 - 1.0 K/uL   Eosinophils Relative 1  0 - 5 %   Eosinophils Absolute 0.1  0.0 - 0.7 K/uL   Basophils Relative 0  0 - 1 %   Basophils Absolute 0.0  0.0 - 0.1 K/uL  COMPREHENSIVE METABOLIC PANEL      Component Value Range   Sodium 138  135 - 145 mEq/L   Potassium 4.4  3.5 - 5.1 mEq/L   Chloride 103  96 - 112 mEq/L   CO2 26  19 - 32 mEq/L   Glucose, Bld 97  70 - 99 mg/dL   BUN 10  6 - 23 mg/dL   Creatinine, Ser 5.40  0.50 - 1.10 mg/dL   Calcium 9.2  8.4 - 98.1 mg/dL   Total Protein 7.3  6.0 - 8.3 g/dL   Albumin 3.6  3.5 - 5.2 g/dL   AST 19  0 - 37 U/L   ALT 23  0 - 35 U/L   Alkaline Phosphatase 69  39 - 117 U/L   Total Bilirubin 0.2 (*) 0.3 - 1.2 mg/dL   GFR calc non Af Amer 81 (*) >90 mL/min   GFR calc Af Amer >90  >90 mL/min  D-DIMER, QUANTITATIVE      Component Value Range   D-Dimer, Quant 0.45  0.00 - 0.48 ug/mL-FEU    Case discussed with Dr. Blanche East, MD 05/23/12 2140

## 2012-05-23 NOTE — ED Provider Notes (Signed)
Medical screening examination/treatment/procedure(s) were performed by a resident physician and as supervising physician I was immediately available for consultation/collaboration.  Additionally, I saw the patient independently, verified the history, examined the patient and discussed the treatment plan with the resident.  The patient's leg looks normal, but I think DVT needs to be ruled out, so I agree with the plan to send her to the hospital for a bilateral venous Doppler.  Leslee Home, M.D.   Reuben Likes, MD 05/23/12 1256

## 2012-05-23 NOTE — ED Notes (Signed)
States has been on progesterone since last week to regulate periods and fertility regimen.  States she has been having headaches that occur several hours after taking the progesterone.  States since yesterday she has also had some intermittent dizziness and "fuzzy" vision. Rates headache 5/10 at present.  Also c/o pain in posterior aspect of lt lower calf that she has noted for 2 days.  States the pain is worse with prolonged standing.  Denies injury.

## 2012-05-23 NOTE — ED Notes (Signed)
Pt complains ofleg pain on left leg, sts knot on back of knee, blurred vision.

## 2012-05-23 NOTE — ED Provider Notes (Signed)
History     CSN: [id number]  Arrival date & time 05/23/12  1107   First MD Initiated Contact with Patient 05/23/12 1109      Chief Complaint  Patient presents with  . Leg Pain  . Dizziness    HPI  [facility].  Pain started 2 days ago while she was standing at work.  She is a CMA.  Describes pain as shooting, cramping that is worse with walking, better with rest.  Patient has tried applying heating pads to calf, but has not taken any OTC pain medications.  She denies any numbness/tingling or decreased sensation.  She is able to bear weight on it.  She denies any history of blood clots.  Denies any hx of cancer, immobility, and does not believe she is pregnant.   Past Medical History  Diagnosis Date  . GERD (gastroesophageal reflux disease)   . Abnormal Pap smear   . PCOS (polycystic ovarian syndrome)     Past Surgical History  Procedure Date  . Tonsillectomy and adenoidectomy 1999  . Adenoidectomy     Family History  Problem Relation Age of Onset  . Diabetes Mother   . Hyperlipidemia Mother   . Hypertension Mother   . Diabetes Father   . Kidney disease Father   . Hypertension Father   . Gestational diabetes Sister   . Depression Sister     History  Substance Use Topics  . Smoking status: Never Smoker   . Smokeless tobacco: Never Used  . Alcohol Use: No    OB History    Grav Para Term Preterm Abortions TAB SAB Ect Mult Living   0 0 0 0 0 0 0 0 0 0       Review of Systems  Per HPI  Allergies  Review of patient's allergies indicates no known allergies.  Home Medications   Current Outpatient Rx  Name Route Sig Dispense Refill  . MEDROXYPROGESTERONE ACETATE 10 MG PO TABS Oral Take 1 tablet (10 mg total) by mouth daily. 10 tablet 3  . METFORMIN HCL 500 MG PO TABS Oral Take 2 tablets (1,000 mg total) by mouth 2 (two) times daily with a meal. 120 tablet 6  . BIOTIN PO Oral Take 1 tablet by mouth daily.    Marland Kitchen  FAMOTIDINE 40 MG PO TABS Oral Take 1 tablet (40 mg total) by mouth once. 30 tablet 0  . MSM PO Oral Take 15 mLs by mouth daily. Mix 1 tablespoon with water.    Marland Kitchen OVER THE COUNTER MEDICATION Oral Take 2 tablets by mouth daily. Vegetarian Multi Vitamin.      BP 129/88  Pulse 62  Temp 98.5 F (36.9 C) (Oral)  Resp 20  SpO2 99%  LMP 04/10/2012  Physical Exam  Constitutional: She is oriented to person, place, and time. No distress.  Musculoskeletal: Normal range of motion. She exhibits tenderness. She exhibits no edema.       Distal pulses intact.  Neurological: She is alert and oriented to person, place, and time. No cranial nerve deficit. Coordination normal.  Skin: Skin is warm and dry. No rash noted. No erythema.       Left calf measures 48 cm, Right calf 49 cm. Negative calf tenderness on left, round knot palpated below calf.    ED Course  Procedures (including critical care time)  Labs Reviewed - No data to display No results found.   1. Left leg pain  MDM   Leg pain, Left: Will send patient to [facility] for lower extremity venous dopplers to rule out DVT.  Pain could be secondary to sprain or strain, will treat with Mobic and Flexeril as needed for pain.  Red flags reviewed per Discharge Instructions.       Barnabas Lister, MD 05/23/12 1229

## 2012-05-24 NOTE — ED Provider Notes (Signed)
I saw and evaluated the patient, reviewed the resident's note and I agree with the findings and plan.   Dione Booze, MD 05/24/12 [phone]

## 2012-07-07 ENCOUNTER — Emergency Department (HOSPITAL_COMMUNITY)
Admission: EM | Admit: 2012-07-07 | Discharge: 2012-07-07 | Disposition: A | Discharge status: Home / Self Care | Payer: BC Managed Care – PPO | Attending: Emergency Medicine | Admitting: Emergency Medicine

## 2012-07-07 ENCOUNTER — Encounter (HOSPITAL_COMMUNITY): Payer: Self-pay | Admitting: Emergency Medicine

## 2012-07-07 DIAGNOSIS — Z8249 Family history of ischemic heart disease and other diseases of the circulatory system: Secondary | ICD-10-CM | POA: Insufficient documentation

## 2012-07-07 DIAGNOSIS — Z841 Family history of disorders of kidney and ureter: Secondary | ICD-10-CM | POA: Insufficient documentation

## 2012-07-07 DIAGNOSIS — L0291 Cutaneous abscess, unspecified: Secondary | ICD-10-CM | POA: Insufficient documentation

## 2012-07-07 DIAGNOSIS — L300 Nummular dermatitis: Secondary | ICD-10-CM

## 2012-07-07 DIAGNOSIS — Z833 Family history of diabetes mellitus: Secondary | ICD-10-CM | POA: Insufficient documentation

## 2012-07-07 DIAGNOSIS — K219 Gastro-esophageal reflux disease without esophagitis: Secondary | ICD-10-CM | POA: Insufficient documentation

## 2012-07-07 DIAGNOSIS — Z8489 Family history of other specified conditions: Secondary | ICD-10-CM | POA: Insufficient documentation

## 2012-07-07 MED ORDER — TRIAMCINOLONE ACETONIDE 0.5 % EX CREA: TOPICAL_CREAM | Freq: Three times a day (TID) | CUTANEOUS | Status: AC

## 2012-07-07 MED ORDER — TRIAMCINOLONE ACETONIDE 0.5 % EX CREA
TOPICAL_CREAM | Freq: Once | CUTANEOUS | Status: AC
  Administered 2012-07-07: 01:00:00 via TOPICAL
  Filled 2012-07-07: qty 15

## 2012-07-07 MED ORDER — PENICILLIN V POTASSIUM 500 MG PO TABS: 500.0000 mg | ORAL_TABLET | Freq: Three times a day (TID) | ORAL | Status: AC

## 2012-07-07 MED ORDER — PENICILLIN V POTASSIUM 250 MG PO TABS
500.0000 mg | ORAL_TABLET | Freq: Once | ORAL | Status: AC
  Administered 2012-07-07: 500 mg via ORAL
  Filled 2012-07-07: qty 1

## 2012-07-07 NOTE — ED Provider Notes (Signed)
History     CSN: [id number]  Arrival date & time 07/07/12  0023   First MD Initiated Contact with Patient 07/07/12 0037      Chief Complaint  Patient presents with  . 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  She's been to dermatology twice offered her different topical medications without relief. No history of eczema as a child.  She has no skin lesions, or breakouts.  Anywhere else on her body  Patient is a 31 y.o. female presenting with rash. The history is provided by the patient.  Rash  This is a recurrent problem. The problem has not changed since onset.The problem is associated with an unknown factor. There has been no fever.    Past Medical History  Diagnosis Date  . GERD (gastroesophageal reflux disease)   . Abnormal Pap smear   . PCOS (polycystic ovarian syndrome)     Past Surgical History  Procedure Date  . Tonsillectomy and adenoidectomy 1999  . Adenoidectomy     Family History  Problem Relation Age of Onset  . Diabetes Mother   . Hyperlipidemia Mother   . Hypertension Mother   . Diabetes Father   . Kidney disease Father   . Hypertension Father   . Gestational diabetes Sister   . Depression Sister     History  Substance Use Topics  . Smoking status: Never Smoker   . Smokeless tobacco: Never Used  . Alcohol Use: No    OB History    Grav Para Term Preterm Abortions TAB SAB Ect Mult Living   0 0 0 0 0 0 0 0 0 0       Review of Systems  Constitutional: Negative for fever and chills.  Musculoskeletal: Negative for joint swelling.  Skin: Positive for rash. Negative for wound.  Neurological: Negative for dizziness and weakness.    Allergies  Review of patient's allergies  indicates no known allergies.  Home Medications   Current Outpatient Rx  Name Route Sig Dispense Refill  . VITAMIN C PO Oral Take 1 tablet by mouth daily.    Marland Kitchen CALCIUM PO Oral Take 1 tablet by mouth daily.    Marland Kitchen METFORMIN HCL 500 MG PO TABS Oral Take 500-1,000 mg by mouth 2 (two) times daily with a meal. Take 2 tablets every morning and take 1 tablet every evening    . VITAMIN E PO Oral Take 1 tablet by mouth daily.    Marland Kitchen PENICILLIN V POTASSIUM 500 MG PO TABS Oral Take 1 tablet (500 mg total) by mouth 3 (three) times daily. 30 tablet 0  . TRIAMCINOLONE ACETONIDE 0.5 % EX CREA Topical Apply topically 3 (three) times daily. Under cotton gloves 30 g 1    BP 138/78  Pulse 57  Temp 97.5 F (36.4 C) (Oral)  Resp 18  SpO2 96%  LMP 06/02/2012  Physical Exam  Constitutional: She is oriented to person, place, and time. She appears well-developed.  HENT:  Head: Normocephalic.  Neck: Normal range of motion.  Cardiovascular: Normal rate.   Pulmonary/Chest: Effort normal.  Abdominal: Soft.  Neurological: She is oriented to person, place, and time.  Skin: Rash noted.       Or rough, scaly use the patches in areas.  Stages of, surfaces between  fingers on the fingers themselves, and on the dorsal aspect of both hands up to the wrist     ED Course  Procedures (including critical care time)  Labs Reviewed - No data to display No results found.   1. Discoid eczema       MDM  IM; appointment, to her hands 3 times a day at night.  She is to wear cotton gloves.  She is also to wear cotton gloves underneath.  The gloves at work, to see if this is perhaps, a uric, and contact dermatitis as a result of the particular brand of gloves          Arman Filter, NP 07/07/12 0104

## 2012-07-07 NOTE — ED Notes (Signed)
PT reports rash all over hands that is weeping that she has had since May; seen two dermatologist and been dx with excema. Skin is raw and areas of sores; Pt had tape on areas; advised against that by RN.

## 2012-07-07 NOTE — ED Provider Notes (Signed)
Medical screening examination/treatment/procedure(s) were performed by non-physician practitioner and as supervising physician I was immediately available for consultation/collaboration.  Olivia Mackie, MD 07/07/12 [phone]

## 2012-08-31 ENCOUNTER — Emergency Department (HOSPITAL_COMMUNITY)
Admission: EM | Admit: 2012-08-31 | Discharge: 2012-08-31 | Disposition: A | Discharge status: Home / Self Care | Payer: BC Managed Care – PPO | Attending: Emergency Medicine | Admitting: Emergency Medicine

## 2012-08-31 ENCOUNTER — Encounter (HOSPITAL_COMMUNITY): Payer: Self-pay | Admitting: *Deleted

## 2012-08-31 DIAGNOSIS — L259 Unspecified contact dermatitis, unspecified cause: Secondary | ICD-10-CM | POA: Insufficient documentation

## 2012-08-31 DIAGNOSIS — L309 Dermatitis, unspecified: Secondary | ICD-10-CM

## 2012-08-31 DIAGNOSIS — Z79899 Other long term (current) drug therapy: Secondary | ICD-10-CM | POA: Insufficient documentation

## 2012-08-31 DIAGNOSIS — E282 Polycystic ovarian syndrome: Secondary | ICD-10-CM | POA: Insufficient documentation

## 2012-08-31 DIAGNOSIS — Z8719 Personal history of other diseases of the digestive system: Secondary | ICD-10-CM | POA: Insufficient documentation

## 2012-08-31 HISTORY — DX: Dermatitis, unspecified: L30.9

## 2012-08-31 MED ORDER — PREDNISONE 20 MG PO TABS
60.0000 mg | ORAL_TABLET | Freq: Once | ORAL | Status: AC
  Administered 2012-08-31: 60 mg via ORAL
  Filled 2012-08-31: qty 3

## 2012-08-31 MED ORDER — HYDROCODONE-ACETAMINOPHEN 5-325 MG PO TABS
2.0000 | ORAL_TABLET | Freq: Once | ORAL | Status: AC
  Administered 2012-08-31: 2 via ORAL
  Filled 2012-08-31: qty 2

## 2012-08-31 MED ORDER — OXYCODONE-ACETAMINOPHEN 5-325 MG PO TABS: 1.0000 | ORAL_TABLET | Freq: Four times a day (QID) | ORAL | Status: DC | PRN

## 2012-08-31 MED ORDER — PREDNISONE 50 MG PO TABS: 50.0000 mg | ORAL_TABLET | Freq: Every day | ORAL | Status: DC

## 2012-08-31 NOTE — ED Notes (Signed)
Pt to ED c/o hand irritation x 2 - 3 months.  Pt has been told it is eczema and had been tx with pcn which worked, but she had a reaction, cefalexin which did work, but is no longer working.  Pt took 2 rounds of oral steroids which improved symptoms, but the MD did not want to tx her anymore. Hands red and broken open.

## 2012-08-31 NOTE — ED Notes (Signed)
Pt dc to home.  Pt ambulatory to exit.

## 2012-08-31 NOTE — ED Provider Notes (Signed)
History     CSN: [id number]  Arrival date & time 08/31/12  0003   First MD Initiated Contact with Patient 08/31/12 0050      Chief Complaint  Patient presents with  . Hand Problem    (Consider location/radiation/quality/duration/timing/severity/associated sxs/prior treatment) HPI.... rash and inflammation and hands for 2-3 months. Patient has been seen by a local dermatologist. She has been put on antibiotics, oral prednisone, and steroid ointment. Symptoms continued to persist. Severity is moderate to severe. Pain is sharp. No other associated symptoms  Past Medical History  Diagnosis Date  . GERD (gastroesophageal reflux disease)   . Abnormal Pap smear   . PCOS (polycystic ovarian syndrome)   . Eczema     Past Surgical History  Procedure Date  . Tonsillectomy and adenoidectomy 1999  . Adenoidectomy     Family History  Problem Relation Age of Onset  . Diabetes Mother   . Hyperlipidemia Mother   . Hypertension Mother   . Diabetes Father   . Kidney disease Father   . Hypertension Father   . Gestational diabetes Sister   . Depression Sister     History  Substance Use Topics  . Smoking status: Never Smoker   . Smokeless tobacco: Never Used  . Alcohol Use: No    OB History    Grav Para Term Preterm Abortions TAB SAB Ect Mult Living   0 0 0 0 0 0 0 0 0 0       Review of Systems  All other systems reviewed and are negative.    Allergies  Penicillins  Home Medications   Current Outpatient Rx  Name  Route  Sig  Dispense  Refill  . VITAMIN C PO   Oral   Take 1 tablet by mouth daily.         Marland Kitchen CALCIUM PO   Oral   Take 1 tablet by mouth daily.         Marland Kitchen METFORMIN HCL 500 MG PO TABS   Oral   Take 500-1,000 mg by mouth 2 (two) times daily with a meal. Take 2 tablets every morning and take 1 tablet every evening         . OXYCODONE-ACETAMINOPHEN 5-325 MG PO TABS   Oral   Take 1-2 tablets by mouth every 6 (six) hours as needed for pain.   20  tablet   0   . PREDNISONE 50 MG PO TABS   Oral   Take 1 tablet (50 mg total) by mouth daily.   7 tablet   1   . TRIAMCINOLONE ACETONIDE 0.5 % EX CREA   Topical   Apply topically 3 (three) times daily. Under cotton gloves   30 g   1   . VITAMIN E PO   Oral   Take 1 tablet by mouth daily.           BP 135/85  Pulse 77  Temp 97.9 F (36.6 C) (Oral)  Resp 18  Ht 5\' 9"  (1.753 m)  Wt 287 lb (130.182 kg)  BMI 42.38 kg/m2  SpO2 100%  Physical Exam  Constitutional: She is oriented to person, place, and time. She appears well-developed and well-nourished.       Obese  HENT:  Head: Normocephalic.  Musculoskeletal: Normal range of motion.  Neurological: She is alert and oriented to person, place, and time.  Skin:       Bilateral hands: Erythematous, scaling, excoriated, with fissures  Psychiatric: She has a normal  mood and affect.    ED Course  Procedures (including critical care time)  Labs Reviewed - No data to display No results found.   1. Eczema       MDM   Prednisone 50 mg daily for 7 days with one refill.  Patient has dermatology relationship.  Percocet #20.  Follow up with dermatologist.  Continue steroid cream.  Use hypoallergenic gloves        Donnetta Hutching, MD 08/31/12 [phone]

## 2012-12-06 ENCOUNTER — Encounter: Payer: BC Managed Care – PPO | Admitting: Advanced Practice Midwife

## 2014-03-28 ENCOUNTER — Other Ambulatory Visit: Payer: Self-pay | Admitting: Obstetrics & Gynecology

## 2014-04-01 ENCOUNTER — Encounter (HOSPITAL_COMMUNITY): Payer: BC Managed Care – PPO | Admitting: Anesthesiology

## 2014-04-01 ENCOUNTER — Encounter (HOSPITAL_COMMUNITY)
Admission: RE | Disposition: A | Discharge status: Home / Self Care | Payer: Self-pay | Source: Ambulatory Visit | Attending: Obstetrics & Gynecology

## 2014-04-01 ENCOUNTER — Encounter (HOSPITAL_COMMUNITY): Payer: Self-pay | Admitting: Anesthesiology

## 2014-04-01 ENCOUNTER — Ambulatory Visit (HOSPITAL_COMMUNITY)
Admission: RE | Admit: 2014-04-01 | Discharge: 2014-04-01 | Disposition: A | Discharge status: Home / Self Care | Payer: BC Managed Care – PPO | Source: Ambulatory Visit | Attending: Obstetrics & Gynecology | Admitting: Obstetrics & Gynecology

## 2014-04-01 ENCOUNTER — Ambulatory Visit (HOSPITAL_COMMUNITY): Payer: BC Managed Care – PPO | Admitting: Anesthesiology

## 2014-04-01 DIAGNOSIS — N915 Oligomenorrhea, unspecified: Secondary | ICD-10-CM

## 2014-04-01 DIAGNOSIS — O039 Complete or unspecified spontaneous abortion without complication: Secondary | ICD-10-CM | POA: Insufficient documentation

## 2014-04-01 DIAGNOSIS — Z6841 Body Mass Index (BMI) 40.0 and over, adult: Secondary | ICD-10-CM | POA: Insufficient documentation

## 2014-04-01 DIAGNOSIS — E669 Obesity, unspecified: Secondary | ICD-10-CM

## 2014-04-01 DIAGNOSIS — K219 Gastro-esophageal reflux disease without esophagitis: Secondary | ICD-10-CM | POA: Insufficient documentation

## 2014-04-01 DIAGNOSIS — Z332 Encounter for elective termination of pregnancy: Principal | ICD-10-CM

## 2014-04-01 DIAGNOSIS — N979 Female infertility, unspecified: Secondary | ICD-10-CM

## 2014-04-01 DIAGNOSIS — Z640 Problems related to unwanted pregnancy: Secondary | ICD-10-CM

## 2014-04-01 HISTORY — PX: DILATION AND EVACUATION: SHX1459

## 2014-04-01 LAB — CBC
HEMATOCRIT: 40.6 % (ref 36.0–46.0)
HEMOGLOBIN: 13.7 g/dL (ref 12.0–15.0)
MCH: 30.4 pg (ref 26.0–34.0)
MCHC: 33.7 g/dL (ref 30.0–36.0)
MCV: 90 fL (ref 78.0–100.0)
Platelets: 182 10*3/uL (ref 150–400)
RBC: 4.51 MIL/uL (ref 3.87–5.11)
RDW: 12.5 % (ref 11.5–15.5)
WBC: 9 10*3/uL (ref 4.0–10.5)

## 2014-04-01 SURGERY — DILATION AND EVACUATION, UTERUS
Anesthesia: Monitor Anesthesia Care | Site: Vagina

## 2014-04-01 MED ORDER — CHLOROPROCAINE HCL 1 % IJ SOLN
INTRAMUSCULAR | Status: AC
Start: 2014-04-01 — End: 2014-04-01
  Filled 2014-04-01: qty 30

## 2014-04-01 MED ORDER — ONDANSETRON HCL 4 MG/2ML IJ SOLN
INTRAMUSCULAR | Status: DC | PRN
  Administered 2014-04-01: 4 mg via INTRAVENOUS

## 2014-04-01 MED ORDER — PROPOFOL 10 MG/ML IV EMUL
INTRAVENOUS | Status: AC
  Filled 2014-04-01: qty 20

## 2014-04-01 MED ORDER — ONDANSETRON HCL 4 MG/2ML IJ SOLN
INTRAMUSCULAR | Status: AC
  Filled 2014-04-01: qty 2

## 2014-04-01 MED ORDER — METOCLOPRAMIDE HCL 5 MG/ML IJ SOLN: 10.0000 mg | Freq: Once | INTRAMUSCULAR | Status: DC | PRN

## 2014-04-01 MED ORDER — KETOROLAC TROMETHAMINE 30 MG/ML IJ SOLN
INTRAMUSCULAR | Status: DC | PRN
  Administered 2014-04-01: 30 mg via INTRAVENOUS

## 2014-04-01 MED ORDER — CHLOROPROCAINE HCL 1 % IJ SOLN
INTRAMUSCULAR | Status: DC | PRN
  Administered 2014-04-01: 10 mL

## 2014-04-01 MED ORDER — MIDAZOLAM HCL 2 MG/2ML IJ SOLN
INTRAMUSCULAR | Status: AC
  Filled 2014-04-01: qty 2

## 2014-04-01 MED ORDER — LIDOCAINE HCL (CARDIAC) 20 MG/ML IV SOLN
INTRAVENOUS | Status: DC | PRN
  Administered 2014-04-01: 80 mg via INTRAVENOUS

## 2014-04-01 MED ORDER — MORPHINE SULFATE 4 MG/ML IJ SOLN: 1.0000 mg | INTRAMUSCULAR | Status: DC | PRN

## 2014-04-01 MED ORDER — FENTANYL CITRATE 0.05 MG/ML IJ SOLN: 25.0000 ug | INTRAMUSCULAR | Status: DC | PRN

## 2014-04-01 MED ORDER — IBUPROFEN 200 MG PO TABS: 600.0000 mg | ORAL_TABLET | Freq: Three times a day (TID) | ORAL | Status: DC | PRN

## 2014-04-01 MED ORDER — LIDOCAINE HCL (CARDIAC) 20 MG/ML IV SOLN
INTRAVENOUS | Status: AC
  Filled 2014-04-01: qty 5

## 2014-04-01 MED ORDER — MIDAZOLAM HCL 2 MG/2ML IJ SOLN
INTRAMUSCULAR | Status: DC | PRN
  Administered 2014-04-01: 1 mg via INTRAVENOUS

## 2014-04-01 MED ORDER — FENTANYL CITRATE 0.05 MG/ML IJ SOLN
INTRAMUSCULAR | Status: DC | PRN
  Administered 2014-04-01 (×2): 50 ug via INTRAVENOUS

## 2014-04-01 MED ORDER — FENTANYL CITRATE 0.05 MG/ML IJ SOLN
INTRAMUSCULAR | Status: AC
  Filled 2014-04-01: qty 2

## 2014-04-01 MED ORDER — PROPOFOL 10 MG/ML IV BOLUS
INTRAVENOUS | Status: DC | PRN
  Administered 2014-04-01: 180 mg via INTRAVENOUS

## 2014-04-01 MED ORDER — CEFAZOLIN SODIUM-DEXTROSE 2-3 GM-% IV SOLR
INTRAVENOUS | Status: AC
  Administered 2014-04-01: 3 g via INTRAVENOUS
  Filled 2014-04-01: qty 50

## 2014-04-01 MED ORDER — CEFAZOLIN SODIUM-DEXTROSE 2-3 GM-% IV SOLR: 2.0000 g | Freq: Once | INTRAVENOUS | Status: DC

## 2014-04-01 MED ORDER — KETOROLAC TROMETHAMINE 30 MG/ML IJ SOLN
INTRAMUSCULAR | Status: AC
  Filled 2014-04-01: qty 1

## 2014-04-01 MED ORDER — LACTATED RINGERS IV SOLN
INTRAVENOUS | Status: DC
  Administered 2014-04-01: 13:00:00 via INTRAVENOUS

## 2014-04-01 SURGICAL SUPPLY — 20 items
CATH ROBINSON RED A/P 16FR (CATHETERS) ×3 IMPLANT
CLOTH BEACON ORANGE TIMEOUT ST (SAFETY) ×3 IMPLANT
DECANTER SPIKE VIAL GLASS SM (MISCELLANEOUS) ×3 IMPLANT
GLOVE BIO SURGEON STRL SZ7 (GLOVE) ×3 IMPLANT
GLOVE BIOGEL PI IND STRL 7.0 (GLOVE) ×1 IMPLANT
GLOVE BIOGEL PI INDICATOR 7.0 (GLOVE) ×2
GOWN STRL REUS W/TWL LRG LVL3 (GOWN DISPOSABLE) ×6 IMPLANT
KIT BERKELEY 1ST TRIMESTER 3/8 (MISCELLANEOUS) ×3 IMPLANT
NEEDLE SPNL 22GX3.5 QUINCKE BK (NEEDLE) ×3 IMPLANT
NS IRRIG 1000ML POUR BTL (IV SOLUTION) ×3 IMPLANT
PACK VAGINAL MINOR WOMEN LF (CUSTOM PROCEDURE TRAY) ×3 IMPLANT
PAD OB MATERNITY 4.3X12.25 (PERSONAL CARE ITEMS) ×3 IMPLANT
PAD PREP 24X48 CUFFED NSTRL (MISCELLANEOUS) ×3 IMPLANT
SET BERKELEY SUCTION TUBING (SUCTIONS) ×3 IMPLANT
SYR CONTROL 10ML LL (SYRINGE) ×3 IMPLANT
TOWEL OR 17X24 6PK STRL BLUE (TOWEL DISPOSABLE) ×6 IMPLANT
VACURETTE 10 RIGID CVD (CANNULA) IMPLANT
VACURETTE 7MM CVD STRL WRAP (CANNULA) IMPLANT
VACURETTE 8 RIGID CVD (CANNULA) ×3 IMPLANT
VACURETTE 9 RIGID CVD (CANNULA) IMPLANT

## 2014-04-01 NOTE — H&P (Signed)
Vanessa Braun is an 33 y.o. female G2P0010, 10 wks unplanned, undesired pregnancy. Desires pregnancy termination.  Pt was not using BCpills regularly and started it randomly and not after menses. She had office sonogram that noted live single IUP. 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 She used Methotrexate off and on and is not sure if she did in early pregnancy. She desires pregnancy termination with surgery regardless.   No LMP recorded. Patient is not currently having periods (Reason: Needs Pregnancy Test).    Past Medical History  Diagnosis Date  . GERD (gastroesophageal reflux disease)   . Abnormal Pap smear   . PCOS (polycystic ovarian syndrome)   . Eczema     Past Surgical History  Procedure Laterality Date  . Tonsillectomy and adenoidectomy  1999  . Adenoidectomy      Family History  Problem Relation Age of Onset  . Diabetes Mother   . Hyperlipidemia Mother   . Hypertension Mother   . Diabetes Father   . Kidney disease Father   . Hypertension Father   . Gestational diabetes Sister   . Depression Sister     Social History:  reports that she has never smoked. She has never used smokeless tobacco. She reports that she does not drink alcohol or use illicit drugs.  Allergies:  Allergies  Allergen Reactions  . Penicillins Itching    Prescriptions prior to admission  Medication Sig Dispense Refill  . Clobetasol Prop Emollient Base 0.05 % emollient cream Apply 1 application topically 2 (two) times daily.      . diphenhydrAMINE (BENADRYL) 25 mg capsule Take 25 mg by mouth every 6 (six) hours as needed.        ROS  neg  Blood pressure 138/92, pulse 98, temperature 98.6 F (37 C), temperature source Oral, resp. rate 18, height 5\' 9"  (1.753 m), weight 307 lb (139.254 kg), SpO2 100.00%. Physical Exam A&O x 3, no acute distress. Pleasant HEENT neg, no thyromegaly Lungs CTA bilat CV RRR, S1S2 normal Abdo soft, non  tender, non acute Extr no edema/ tenderness Pelvic Uterus 10 wk, soft, no adnexal masses, cervix normal   Results for orders placed during the hospital encounter of 04/01/14 (from the past 24 hour(s))  CBC     Status: None   Collection Time    04/01/14 12:51 PM      Result Value Ref Range   WBC 9.0  4.0 - 10.5 K/uL   RBC 4.51  3.87 - 5.11 MIL/uL   Hemoglobin 13.7  12.0 - 15.0 g/dL   HCT 56.8  12.7 - 51.7 %   MCV 90.0  78.0 - 100.0 fL   MCH 30.4  26.0 - 34.0 pg   MCHC 33.7  30.0 - 36.0 g/dL   RDW 00.1  74.9 - 44.9 %   Platelets 182  150 - 400 K/uL    No results found.  Assessment/Plan: 33 yo, G2P0010 at 10 wks here for pregnancy termination. She was given the option to continue pregnancy and adoption, declined all.  Risks/complications of surgery reviewed incl infection, bleeding, damage to internal organs including bladder, bowels, ureters, blood vessels, other risks from anesthesia, VTE and delayed complications of any surgery, complications in future surgery reviewed.   Robley Fries 04/01/2014, 1:28 PM

## 2014-04-01 NOTE — Transfer of Care (Signed)
Immediate Anesthesia Transfer of Care Note  Patient: Vanessa Braun  Procedure(s) Performed: Procedure(s): DILATATION AND EVACUATION (N/A)  Patient Location: PACU  Anesthesia Type:General  Level of Consciousness: awake, sedated and patient cooperative  Airway & Oxygen Therapy: Patient Spontanous Breathing and Patient connected to nasal cannula oxygen  Post-op Assessment: Report given to PACU RN and Post -op Vital signs reviewed and stable  Post vital signs: Reviewed and stable  Complications: No apparent anesthesia complications

## 2014-04-01 NOTE — Anesthesia Preprocedure Evaluation (Addendum)
Anesthesia Evaluation  Patient identified by MRN, date of birth, ID band Patient awake    Reviewed: Allergy & Precautions, H&P , Patient's Chart, lab work & pertinent test results, reviewed documented beta blocker date and time   Airway Mallampati: II TM Distance: >3 FB Neck ROM: full    Dental no notable dental hx.    Pulmonary  breath sounds clear to auscultation  Pulmonary exam normal       Cardiovascular Rhythm:regular Rate:Normal     Neuro/Psych    GI/Hepatic   Endo/Other  Morbid obesity  Renal/GU      Musculoskeletal   Abdominal   Peds  Hematology   Anesthesia Other Findings   Reproductive/Obstetrics                          Anesthesia Physical Anesthesia Plan  ASA: III  Anesthesia Plan: MAC   Post-op Pain Management:    Induction: Intravenous  Airway Management Planned: LMA, Mask and Natural Airway  Additional Equipment:   Intra-op Plan:   Post-operative Plan:   Informed Consent: I have reviewed the patients History and Physical, chart, labs and discussed the procedure including the risks, benefits and alternatives for the proposed anesthesia with the patient or authorized representative who has indicated his/her understanding and acceptance.   Dental Advisory Given  Plan Discussed with: CRNA and Surgeon  Anesthesia Plan Comments: (Discussed sedation and potential to need to place airway or ETT if warranted by clinical changes intra-operatively. We will start procedure as MAC.)        Anesthesia Quick Evaluation

## 2014-04-01 NOTE — Op Note (Signed)
Preoperative diagnosis: 10 wks unplanned unwanted pregnancy Postop diagnosis: as above.  Procedure: Suction D&E Anesthesia General, LMA Surgeon: Shea Evans, MD Assistant: none  IV fluids: 600 cc LR Estimated blood loss : 50 cc  Urine output:  straight catheter preop 100 cc Complications none Condition stable Disposition PACU  Specimen: products of conception sent to pathology.  Procedure  Indication: 33 yo female with unplanned, unwanted pregnancy who desired pregnancy termination. Decision was made to proceed with suction D&E. Risks/ complications of surgery including infection, bleeding, damage to internal organs including uterine perforation, Asherman syndrome were reviewed. She voiced understanding and gave informed written consent.  Patient was brought to the operating room with IV running. She received preop 2 gm Ancef. She underwent general endotracheal anesthesia without complications. She was given dorsolithotomy position. Parts were prepped and draped in standard fashion. Bladder was catheterized once.  Bimanual exam revealed uterus to be anteverted and 11-12 week size.  Speculum was placed and cervix was grasped with single-tooth tenaculum. Cervical os was dilated to 27  [place] dilator. A # 8 suction curette was introduced in the uterine cavity and suction evacuation of products of conception was done in multiple step wise fashion. Gentle sharp curettage was performed. Suction cannula was reintroduced.  The bleeding was controlled well and uterus was firm on exam.  At this point the procedure was complete.  All counts are correct x2. Specimen products of conception. No complications. Patient was made supine dorsal anesthesia and brought to the recovery room in stable condition.  Patient was given Methergine 0.2 mg IM in PACU for prophylaxis for bleeding.   Patient will be discharged home today. Dc meds Ibuprofen prn pain.  Dc follow up in 2 weeks in office. Warning signs of  infection and excessive bleeding reviewed.  V.Rufina Kimery, MD.

## 2014-04-01 NOTE — Discharge Instructions (Signed)
Dilation and Curettage or Vacuum Curettage Dilation and curettage (D&C) and vacuum curettage are minor procedures. A D&C involves stretching (dilation) the cervix and scraping (curettage) the inside lining of the womb (uterus). During a D&C, tissue is gently scraped from the inside lining of the uterus. During a vacuum curettage, the lining and tissue in the uterus are removed with the use of gentle suction.  Curettage may be performed to either diagnose or treat a problem. As a diagnostic procedure, curettage is performed to examine tissues from the uterus. A diagnostic curettage may be performed for the following symptoms:   Irregular bleeding in the uterus.   Bleeding with the development of clots.   Spotting between menstrual periods.   Prolonged menstrual periods.   Bleeding after menopause.   No menstrual period (amenorrhea).   A change in size and shape of the uterus.  As a treatment procedure, curettage may be performed for the following reasons:   Removal of an IUD (intrauterine device).   Removal of retained placenta after giving birth. Retained placenta can cause an infection or bleeding severe enough to require transfusions.   Abortion.   Miscarriage.   Removal of polyps inside the uterus.   Removal of uncommon types of noncancerous lumps (fibroids).  LET YOUR HEALTH CARE PROVIDER KNOW ABOUT:   Any allergies you have.   All medicines you are taking, including vitamins, herbs, eye drops, creams, and over-the-counter medicines.   Previous problems you or members of your family have had with the use of anesthetics.   Any blood disorders you have.   Previous surgeries you have had.   Medical conditions you have. RISKS AND COMPLICATIONS  Generally, this is a safe procedure. However, as with any procedure, complications can occur. Possible complications include:  Excessive bleeding.   Infection of the uterus.   Damage to the cervix.    Development of scar tissue (adhesions) inside the uterus, later causing abnormal amounts of menstrual bleeding.   Complications from the general anesthetic, if a general anesthetic is used.   Putting a hole (perforation) in the uterus. This is rare.  BEFORE THE PROCEDURE   Eat and drink before the procedure only as directed by your health care provider.   Arrange for someone to take you home.  PROCEDURE  This procedure usually takes about 15 30 minutes.  You will be given one of the following:  A medicine that numbs the area in and around the cervix (local anesthetic).   A medicine to make you sleep through the procedure (general anesthetic).  You will lie on your back with your legs in stirrups.   A warm metal or plastic instrument (speculum) will be placed in your vagina to keep it open and to allow the health care provider to see the cervix.  There are two ways in which your cervix can be softened and dilated. These include:   Taking a medicine.   Having thin rods (laminaria) inserted into your cervix.   A curved tool (curette) will be used to scrape cells from the inside lining of the uterus. In some cases, gentle suction is applied with the curette. The curette will then be removed.  AFTER THE PROCEDURE   You will rest in the recovery area until you are stable and are ready to go home.   You may feel sick to your stomach (nauseous) or throw up (vomit) if you were given a general anesthetic.   You may have a sore throat if a   tube was placed in your throat during general anesthesia.   You may have light cramping and bleeding. This may last for 2 days to 2 weeks after the procedure.   Your uterus needs to make a new lining after the procedure. This may make your next period late. Document Released: 10/11/2005 Document Revised: 06/13/2013 Document Reviewed: 05/10/2013 Exit[employer] Patient Information 2014 [employer].  

## 2014-04-02 ENCOUNTER — Encounter (HOSPITAL_COMMUNITY): Payer: Self-pay | Admitting: Obstetrics & Gynecology

## 2014-04-02 NOTE — Anesthesia Postprocedure Evaluation (Signed)
  Anesthesia Post-op Note  Patient: Vanessa Braun  Procedure(s) Performed: Procedure(s): DILATATION AND EVACUATION (N/A)  Patient Location: PACU  Anesthesia Type:General  Level of Consciousness: awake, alert  and oriented  Airway and Oxygen Therapy: Patient Spontanous Breathing  Post-op Pain: none  Post-op Assessment: Post-op Vital signs reviewed, Patient's Cardiovascular Status Stable, Respiratory Function Stable, Patent Airway, No signs of Nausea or vomiting and Pain level controlled  Post-op Vital Signs: Reviewed and stable  Last Vitals:  Filed Vitals:   04/01/14 1515  BP: 123/67  Pulse: 53  Temp:   Resp: 2    Complications: No apparent anesthesia complications

## 2014-07-04 DIAGNOSIS — Z9884 Bariatric surgery status: Secondary | ICD-10-CM | POA: Insufficient documentation

## 2014-08-16 DIAGNOSIS — L309 Dermatitis, unspecified: Secondary | ICD-10-CM | POA: Insufficient documentation

## 2015-01-12 ENCOUNTER — Emergency Department (HOSPITAL_COMMUNITY): Payer: BLUE CROSS/BLUE SHIELD

## 2015-01-12 ENCOUNTER — Emergency Department (HOSPITAL_COMMUNITY)
Admission: EM | Admit: 2015-01-12 | Discharge: 2015-01-12 | Disposition: A | Discharge status: Home / Self Care | Payer: BLUE CROSS/BLUE SHIELD | Attending: Emergency Medicine | Admitting: Emergency Medicine

## 2015-01-12 ENCOUNTER — Encounter (HOSPITAL_COMMUNITY): Payer: Self-pay | Admitting: *Deleted

## 2015-01-12 DIAGNOSIS — Z8639 Personal history of other endocrine, nutritional and metabolic disease: Secondary | ICD-10-CM | POA: Diagnosis not present

## 2015-01-12 DIAGNOSIS — Z79899 Other long term (current) drug therapy: Secondary | ICD-10-CM | POA: Diagnosis not present

## 2015-01-12 DIAGNOSIS — R1013 Epigastric pain: Secondary | ICD-10-CM | POA: Diagnosis present

## 2015-01-12 DIAGNOSIS — Z872 Personal history of diseases of the skin and subcutaneous tissue: Secondary | ICD-10-CM | POA: Diagnosis not present

## 2015-01-12 DIAGNOSIS — Z793 Long term (current) use of hormonal contraceptives: Secondary | ICD-10-CM | POA: Diagnosis not present

## 2015-01-12 DIAGNOSIS — Z3202 Encounter for pregnancy test, result negative: Secondary | ICD-10-CM | POA: Diagnosis not present

## 2015-01-12 DIAGNOSIS — K219 Gastro-esophageal reflux disease without esophagitis: Secondary | ICD-10-CM | POA: Insufficient documentation

## 2015-01-12 DIAGNOSIS — K529 Noninfective gastroenteritis and colitis, unspecified: Secondary | ICD-10-CM | POA: Insufficient documentation

## 2015-01-12 DIAGNOSIS — Z7952 Long term (current) use of systemic steroids: Secondary | ICD-10-CM | POA: Insufficient documentation

## 2015-01-12 DIAGNOSIS — Z880 Allergy status to penicillin: Secondary | ICD-10-CM | POA: Insufficient documentation

## 2015-01-12 LAB — URINALYSIS, ROUTINE W REFLEX MICROSCOPIC
Bilirubin Urine: NEGATIVE
Glucose, UA: NEGATIVE mg/dL
Hgb urine dipstick: NEGATIVE
Ketones, ur: NEGATIVE mg/dL
Leukocytes, UA: NEGATIVE
Nitrite: NEGATIVE
PROTEIN: NEGATIVE mg/dL
SPECIFIC GRAVITY, URINE: 1.023 (ref 1.005–1.030)
UROBILINOGEN UA: 1 mg/dL (ref 0.0–1.0)
pH: 6.5 (ref 5.0–8.0)

## 2015-01-12 LAB — COMPREHENSIVE METABOLIC PANEL
ALT: 24 U/L (ref 0–35)
AST: 19 U/L (ref 0–37)
Albumin: 3.7 g/dL (ref 3.5–5.2)
Alkaline Phosphatase: 53 U/L (ref 39–117)
Anion gap: 7 (ref 5–15)
BUN: 12 mg/dL (ref 6–23)
CO2: 29 mmol/L (ref 19–32)
CREATININE: 1.01 mg/dL (ref 0.50–1.10)
Calcium: 9.1 mg/dL (ref 8.4–10.5)
Chloride: 104 mmol/L (ref 96–112)
GFR calc Af Amer: 84 mL/min — ABNORMAL LOW (ref >90)
GFR calc non Af Amer: 73 mL/min — ABNORMAL LOW (ref >90)
GLUCOSE: 104 mg/dL — AB (ref 70–99)
Potassium: 3.9 mmol/L (ref 3.5–5.1)
Sodium: 140 mmol/L (ref 135–145)
TOTAL PROTEIN: 7.1 g/dL (ref 6.0–8.3)
Total Bilirubin: 0.8 mg/dL (ref 0.3–1.2)

## 2015-01-12 LAB — CBC WITH DIFFERENTIAL/PLATELET
BASOS ABS: 0 10*3/uL (ref 0.0–0.1)
Basophils Relative: 0 % (ref 0–1)
Eosinophils Absolute: 0.1 10*3/uL (ref 0.0–0.7)
Eosinophils Relative: 1 % (ref 0–5)
HEMATOCRIT: 42.8 % (ref 36.0–46.0)
Hemoglobin: 13.9 g/dL (ref 12.0–15.0)
LYMPHS PCT: 31 % (ref 12–46)
Lymphs Abs: 3.5 10*3/uL (ref 0.7–4.0)
MCH: 30.6 pg (ref 26.0–34.0)
MCHC: 32.5 g/dL (ref 30.0–36.0)
MCV: 94.3 fL (ref 78.0–100.0)
MONO ABS: 0.9 10*3/uL (ref 0.1–1.0)
MONOS PCT: 8 % (ref 3–12)
NEUTROS ABS: 7 10*3/uL (ref 1.7–7.7)
Neutrophils Relative %: 60 % (ref 43–77)
PLATELETS: 195 10*3/uL (ref 150–400)
RBC: 4.54 MIL/uL (ref 3.87–5.11)
RDW: 13.8 % (ref 11.5–15.5)
WBC: 11.6 10*3/uL — ABNORMAL HIGH (ref 4.0–10.5)

## 2015-01-12 LAB — POC URINE PREG, ED: PREG TEST UR: NEGATIVE

## 2015-01-12 LAB — LIPASE, BLOOD: Lipase: 33 U/L (ref 11–59)

## 2015-01-12 MED ORDER — ONDANSETRON HCL 4 MG/2ML IJ SOLN
4.0000 mg | Freq: Once | INTRAMUSCULAR | Status: AC
  Administered 2015-01-12: 4 mg via INTRAVENOUS
  Filled 2015-01-12: qty 2

## 2015-01-12 MED ORDER — GI COCKTAIL ~~LOC~~
30.0000 mL | Freq: Once | ORAL | Status: AC
  Administered 2015-01-12: 30 mL via ORAL
  Filled 2015-01-12: qty 30

## 2015-01-12 MED ORDER — IOHEXOL 300 MG/ML  SOLN
100.0000 mL | Freq: Once | INTRAMUSCULAR | Status: AC | PRN
Start: 2015-01-12 — End: 2015-01-12
  Administered 2015-01-12: 100 mL via INTRAVENOUS

## 2015-01-12 MED ORDER — IOHEXOL 300 MG/ML  SOLN
25.0000 mL | Freq: Once | INTRAMUSCULAR | Status: AC | PRN
  Administered 2015-01-12: 25 mL via ORAL

## 2015-01-12 MED ORDER — HYDROMORPHONE HCL 1 MG/ML IJ SOLN
1.0000 mg | Freq: Once | INTRAMUSCULAR | Status: AC
  Administered 2015-01-12: 1 mg via INTRAVENOUS
  Filled 2015-01-12: qty 1

## 2015-01-12 MED ORDER — HYDROCODONE-ACETAMINOPHEN 5-325 MG PO TABS: 1.0000 | ORAL_TABLET | Freq: Four times a day (QID) | ORAL | Status: DC | PRN

## 2015-01-12 MED ORDER — SODIUM CHLORIDE 0.9 % IV BOLUS (SEPSIS)
1000.0000 mL | Freq: Once | INTRAVENOUS | Status: AC
  Administered 2015-01-12: 1000 mL via INTRAVENOUS

## 2015-01-12 NOTE — Discharge Instructions (Signed)
Viral Gastroenteritis Viral gastroenteritis is also called stomach flu. This illness is caused by a certain type of germ (virus). It can cause sudden watery poop (diarrhea) and throwing up (vomiting). This can cause you to lose body fluids (dehydration). This illness usually lasts for 3 to 8 days. It usually goes away on its own. HOME CARE   Drink enough fluids to keep your pee (urine) clear or pale yellow. Drink small amounts of fluids often.  Ask your doctor how to replace body fluid losses (rehydration).  Avoid:  Foods high in sugar.  Alcohol.  Bubbly (carbonated) drinks.  Tobacco.  Juice.  Caffeine drinks.  Very hot or cold fluids.  Fatty, greasy foods.  Eating too much at one time.  Dairy products until 24 to 48 hours after your watery poop stops.  You may eat foods with active cultures (probiotics). They can be found in some yogurts and supplements.  Wash your hands well to avoid spreading the illness.  Only take medicines as told by your doctor. Do not give aspirin to children. Do not take medicines for watery poop (antidiarrheals).  Ask your doctor if you should keep taking your regular medicines.  Keep all doctor visits as told. GET HELP RIGHT AWAY IF:   You cannot keep fluids down.  You do not pee at least once every 6 to 8 hours.  You are short of breath.  You see blood in your poop or throw up. This may look like coffee grounds.  You have belly (abdominal) pain that gets worse or is just in one small spot (localized).  You keep throwing up or having watery poop.  You have a fever.  The patient is a child younger than 3 months, and he or she has a fever.  The patient is a child older than 3 months, and he or she has a fever and problems that do not go away.  The patient is a child older than 3 months, and he or she has a fever and problems that suddenly get worse.  The patient is a baby, and he or she has no tears when crying. MAKE SURE YOU:     Understand these instructions.  Will watch your condition.  Will get help right away if you are not doing well or get worse. Document Released: 03/29/2008 Document Revised: 01/03/2012 Document Reviewed: 07/28/2011 ExitCare Patient Information 2015 [employer]. This information is not intended to replace advice given to you by your health care provider. Make sure you discuss any questions you have with your health care provider.  

## 2015-01-12 NOTE — ED Provider Notes (Signed)
CSN: [id number]     Arrival date & time 01/12/15  1212 History   None    Chief Complaint  Patient presents with  . Abdominal Pain     (Consider location/radiation/quality/duration/timing/severity/associated sxs/prior Treatment) Patient is a 34 y.o. female presenting with abdominal pain. The history is provided by the patient.  Abdominal Pain Pain location:  Epigastric Pain quality: sharp and stabbing   Pain radiates to:  Does not radiate Pain severity:  Severe Onset quality:  Sudden Duration:  2 days Timing:  Constant Progression:  Worsening Chronicity:  New Relieved by:  None tried Worsened by:  Palpation and movement Ineffective treatments:  None tried Associated symptoms: constipation and nausea   Associated symptoms: no diarrhea, no dysuria, no fever, no flatus and no vomiting   Risk factors: multiple surgeries   Risk factors: not pregnant     Past Medical History  Diagnosis Date  . GERD (gastroesophageal reflux disease)   . Abnormal Pap smear   . PCOS (polycystic ovarian syndrome)   . Eczema    Past Surgical History  Procedure Laterality Date  . Tonsillectomy and adenoidectomy  1999  . Adenoidectomy    . Dilation and evacuation N/A 04/01/2014    Procedure: DILATATION AND EVACUATION;  Surgeon: Robley FriesVaishali R Mody, MD;  Location: [facility];  Service: Gynecology;  Laterality: N/A;  . Gastric bypass     Family History  Problem Relation Age of Onset  . Diabetes Mother   . Hyperlipidemia Mother   . Hypertension Mother   . Diabetes Father   . Kidney disease Father   . Hypertension Father   . Gestational diabetes Sister   . Depression Sister    History  Substance Use Topics  . Smoking status: Never Smoker   . Smokeless tobacco: Never Used  . Alcohol Use: Yes     Comment: occ   OB History    Gravida Para Term Preterm AB TAB SAB Ectopic Multiple Living   0 0 0 0 0 0 0 0 0 0      Review of Systems  Constitutional: Negative for fever.  Gastrointestinal: Positive  for nausea, abdominal pain and constipation. Negative for vomiting, diarrhea and flatus.  Genitourinary: Negative for dysuria.  All other systems reviewed and are negative.     Allergies  Penicillins  Home Medications   Prior to Admission medications   Medication Sig Start Date End Date Taking? Authorizing Provider  diphenhydrAMINE (BENADRYL) 25 mg capsule Take 25 mg by mouth every 6 (six) hours as needed for allergies or sleep.    Yes Historical Provider, MD  ibuprofen (ADVIL) 200 MG tablet Take 3 tablets (600 mg total) by mouth every 8 (eight) hours as needed for moderate pain. 04/01/14  Yes Shea EvansVaishali Mody, MD  MICROGESTIN FE 1.5/30 1.5-30 MG-MCG tablet Take 1 tablet by mouth daily.  12/27/14  Yes Historical Provider, MD  ranitidine (ZANTAC) 150 MG tablet Take 150 mg by mouth 2 (two) times daily. 11/30/14  Yes Historical Provider, MD  ursodiol (ACTIGALL) 300 MG capsule Take 300 mg by mouth 2 (two) times daily. 12/03/14  Yes Historical Provider, MD  calcipotriene (DOVONOX) 0.005 % ointment  12/26/14   Historical Provider, MD  clobetasol ointment (TEMOVATE) 0.05 %  11/09/14   Historical Provider, MD  HYDROcodone-acetaminophen (NORCO/VICODIN) 5-325 MG per tablet Take 1 tablet by mouth every 6 (six) hours as needed. 01/12/15   Dorna LeitzAlex Vartan Kerins, MD  predniSONE (DELTASONE) 10 MG tablet Take 10 mg by mouth 2 (two) times daily.  01/02/15   Historical Provider, MD   BP 148/90 mmHg  Pulse 50  Temp(Src) 98.1 F (36.7 C) (Oral)  Resp 18  Ht  (1.753 m)  Wt 214 lb 4.8 oz (97.206 kg)  BMI 31.63 kg/m2  SpO2 100% Physical Exam  Constitutional: She appears well-developed and well-nourished. She appears distressed (in obvious pain).  HENT:  Head: Normocephalic and atraumatic.  Mouth/Throat: Oropharynx is clear and moist.  Eyes: EOM are normal. Pupils are equal, round, and reactive to light.  Neck: Normal range of motion. Neck supple.  Cardiovascular: Normal rate, regular rhythm and normal heart sounds.  Exam  reveals no gallop and no friction rub.   No murmur heard. Pulmonary/Chest: Effort normal and breath sounds normal. No respiratory distress. She has no wheezes. She has no rales.  Abdominal: Soft. She exhibits no distension. There is tenderness (Primarily epigastric). There is guarding (Localized, epigastric). There is no rebound.  Musculoskeletal: Normal range of motion. She exhibits no edema.  Lymphadenopathy:    She has no cervical adenopathy.  Skin: Skin is warm and dry. No rash noted. She is not diaphoretic.  Psychiatric: She has a normal mood and affect. Her behavior is normal. Judgment and thought content normal.  Nursing note and vitals reviewed.   ED Course  Procedures (including critical care time) Labs Review Labs Reviewed  CBC WITH DIFFERENTIAL/PLATELET - Abnormal; Notable for the following:    WBC 11.6 (*)    All other components within normal limits  COMPREHENSIVE METABOLIC PANEL - Abnormal; Notable for the following:    Glucose, Bld 104 (*)    GFR calc non Af Amer 73 (*)    GFR calc Af Amer 84 (*)    All other components within normal limits  LIPASE, BLOOD  URINALYSIS, ROUTINE W REFLEX MICROSCOPIC  POC URINE PREG, ED    Imaging Review Ct Abdomen Pelvis W Contrast  01/12/2015   CLINICAL DATA:  34 year old female with abdominal and pelvic pain and nausea for 1 day. History of gastric bypass.  EXAM: CT ABDOMEN AND PELVIS WITH CONTRAST  TECHNIQUE: Multidetector CT imaging of the abdomen and pelvis was performed using the standard protocol following bolus administration of intravenous contrast.  CONTRAST:  OMNIPAQUE IOHEXOL 300 MG/ML  SOLN  COMPARISON:  None.  FINDINGS: Lower chest:  Unremarkable  Hepatobiliary: The liver and gallbladder are unremarkable. There is no evidence of biliary dilatation.  Pancreas: Unremarkable  Spleen: Unremarkable  Adrenals/Urinary Tract: The kidneys, adrenal glands bladder are unremarkable.  Stomach/Bowel: Gastric bypass changes are noted.  There is equivocal wall thickening of several small bowel loops within the abdomen and pelvis - an enteritis is not excluded. There is no evidence of bowel obstruction or pneumoperitoneum. Diffuse mesenteric edema is identified. The appendix is unremarkable.  Vascular/Lymphatic: Prominent mesenteric lymph nodes are identified. There is no evidence of abdominal aortic aneurysm.  Reproductive: The uterus and adnexal regions are unremarkable.  Other: Small amount of ascites within the abdomen and pelvis noted. Subcutaneous edema is identified.  Musculoskeletal: No acute or suspicious bony abnormalities are noted.  IMPRESSION: Equivocal wall thickening of small bowel loops within the abdomen and pelvis which may represent an enteritis. Small amount of ascites within the abdomen pelvis and diffuse mesenteric edema.  Gastric bypass changes. No evidence of pneumoperitoneum, abscess or bowel obstruction.   Electronically Signed   By: Harmon Pier M.D.   On: 01/12/2015 17:03     EKG Interpretation None      MDM  Final diagnoses:  Enteritis    8-year-old female with past medical history of hypertension and gastric bypass surgery last year presents with epigastric abdominal pain with nausea and no bowel movements today. She is not passing gas either. Denies fevers.  She has exquisite tenderness in the epigastrium but no rebound or guarding. Particular pregnancy negative and laboratory workup obtained from triage significant for leukocytosis. Concern for competition given previous gastric bypass surgery with acute onset of pain yesterday. CT scan abdomen and pelvis with contrast ordered and pain meds and fluids given.  CT scan reassuring reflective of only enteritis. Blood pressure improved with pain control. Repeat abdominal exam much improved and continues to be without acute abdomen. Will DC with supportive care, fluids, rest, pain medications and PCP follow-up. Also urged her to contact Dr. Barnetta Chapel for  follow-up of her bariatric surgery as needed.  Dorna Leitz, MD 01/12/15 1800  Richardean Canal, MD 01/12/15 [phone]

## 2015-01-12 NOTE — ED Notes (Signed)
Patient transported to CT 

## 2015-01-12 NOTE — ED Notes (Signed)
Let CT know that patient has finished the contrast

## 2015-01-12 NOTE — ED Notes (Signed)
Pt stable, pain decreased, wheelchair to door provided, called family for pickup.

## 2015-01-12 NOTE — ED Notes (Signed)
Pt reports hx of gastric bypass 6 months ago and now has mid abd pain. Denies n/v/d.

## 2015-01-12 NOTE — ED Notes (Signed)
POC Urine Pregnancy, ED. Results as Negative. ED-Lab

## 2016-01-06 ENCOUNTER — Encounter (HOSPITAL_COMMUNITY): Payer: Self-pay

## 2016-01-06 ENCOUNTER — Emergency Department (HOSPITAL_COMMUNITY)
Admission: EM | Admit: 2016-01-06 | Discharge: 2016-01-06 | Disposition: A | Discharge status: Home / Self Care | Payer: BLUE CROSS/BLUE SHIELD | Attending: Emergency Medicine | Admitting: Emergency Medicine

## 2016-01-06 DIAGNOSIS — R112 Nausea with vomiting, unspecified: Secondary | ICD-10-CM | POA: Diagnosis not present

## 2016-01-06 DIAGNOSIS — R109 Unspecified abdominal pain: Secondary | ICD-10-CM | POA: Insufficient documentation

## 2016-01-06 LAB — COMPREHENSIVE METABOLIC PANEL
ALBUMIN: 4.3 g/dL (ref 3.5–5.0)
ALT: 24 U/L (ref 14–54)
AST: 27 U/L (ref 15–41)
Alkaline Phosphatase: 74 U/L (ref 38–126)
Anion gap: 11 (ref 5–15)
BILIRUBIN TOTAL: 0.4 mg/dL (ref 0.3–1.2)
BUN: 17 mg/dL (ref 6–20)
CO2: 25 mmol/L (ref 22–32)
CREATININE: 0.93 mg/dL (ref 0.44–1.00)
Calcium: 9.5 mg/dL (ref 8.9–10.3)
Chloride: 106 mmol/L (ref 101–111)
GFR calc Af Amer: >60 mL/min (ref >60)
Glucose, Bld: 119 mg/dL — ABNORMAL HIGH (ref 65–99)
Potassium: 4.2 mmol/L (ref 3.5–5.1)
Sodium: 142 mmol/L (ref 135–145)
TOTAL PROTEIN: 7.5 g/dL (ref 6.5–8.1)

## 2016-01-06 LAB — CBC
HCT: 41.5 % (ref 36.0–46.0)
Hemoglobin: 13.9 g/dL (ref 12.0–15.0)
MCH: 32 pg (ref 26.0–34.0)
MCHC: 33.5 g/dL (ref 30.0–36.0)
MCV: 95.4 fL (ref 78.0–100.0)
Platelets: 227 10*3/uL (ref 150–400)
RBC: 4.35 MIL/uL (ref 3.87–5.11)
RDW: 13.1 % (ref 11.5–15.5)
WBC: 9.7 10*3/uL (ref 4.0–10.5)

## 2016-01-06 LAB — I-STAT BETA HCG BLOOD, ED (MC, WL, AP ONLY): I-stat hCG, quantitative: <5 m[IU]/mL (ref <5)

## 2016-01-06 LAB — LIPASE, BLOOD: Lipase: 39 U/L (ref 11–51)

## 2016-01-06 MED ORDER — ONDANSETRON 4 MG PO TBDP
4.0000 mg | ORAL_TABLET | Freq: Once | ORAL | Status: AC
  Administered 2016-01-06: 4 mg via ORAL

## 2016-01-06 MED ORDER — ONDANSETRON 4 MG PO TBDP
ORAL_TABLET | ORAL | Status: AC
  Filled 2016-01-06: qty 1

## 2016-01-06 MED ORDER — FENTANYL CITRATE (PF) 100 MCG/2ML IJ SOLN
50.0000 ug | Freq: Once | INTRAMUSCULAR | Status: AC
  Administered 2016-01-06: 50 ug via NASAL

## 2016-01-06 MED ORDER — FENTANYL CITRATE (PF) 100 MCG/2ML IJ SOLN
INTRAMUSCULAR | Status: AC
  Filled 2016-01-06: qty 2

## 2016-01-06 NOTE — ED Notes (Signed)
PAIN MEDICATION ADMINISTERED IN TRIAGE AND ENCOURAGED NOT TO DRIVE

## 2016-01-06 NOTE — ED Notes (Signed)
NAUSEA MEDICATION (ODT ZOFRAN) GIVEN IN TRIAGE

## 2016-01-06 NOTE — ED Notes (Signed)
Pt here with c/o abdominal pain, nausea and vomiting today after eating a chicken sandwich for lunch. She has vomited x 2 during triage.

## 2016-05-24 ENCOUNTER — Encounter (HOSPITAL_COMMUNITY): Payer: Self-pay | Admitting: Emergency Medicine

## 2016-05-24 ENCOUNTER — Emergency Department (HOSPITAL_COMMUNITY): Payer: BLUE CROSS/BLUE SHIELD

## 2016-05-24 DIAGNOSIS — R0789 Other chest pain: Secondary | ICD-10-CM | POA: Diagnosis not present

## 2016-05-24 DIAGNOSIS — S7001XA Contusion of right hip, initial encounter: Secondary | ICD-10-CM | POA: Diagnosis not present

## 2016-05-24 DIAGNOSIS — Y999 Unspecified external cause status: Secondary | ICD-10-CM | POA: Diagnosis not present

## 2016-05-24 DIAGNOSIS — S0081XA Abrasion of other part of head, initial encounter: Secondary | ICD-10-CM | POA: Insufficient documentation

## 2016-05-24 DIAGNOSIS — Y939 Activity, unspecified: Secondary | ICD-10-CM | POA: Diagnosis not present

## 2016-05-24 DIAGNOSIS — Y92410 Unspecified street and highway as the place of occurrence of the external cause: Secondary | ICD-10-CM | POA: Diagnosis not present

## 2016-05-24 DIAGNOSIS — S8001XA Contusion of right knee, initial encounter: Secondary | ICD-10-CM | POA: Insufficient documentation

## 2016-05-24 DIAGNOSIS — I10 Essential (primary) hypertension: Secondary | ICD-10-CM | POA: Insufficient documentation
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
## 2016-05-25 ENCOUNTER — Emergency Department (HOSPITAL_COMMUNITY): Payer: BLUE CROSS/BLUE SHIELD

## 2016-05-25 ENCOUNTER — Emergency Department (HOSPITAL_COMMUNITY)
Admission: EM | Admit: 2016-05-25 | Discharge: 2016-05-25 | Disposition: A | Discharge status: Home / Self Care | Payer: BLUE CROSS/BLUE SHIELD | Attending: Emergency Medicine | Admitting: Emergency Medicine

## 2016-05-25 ENCOUNTER — Encounter (HOSPITAL_COMMUNITY): Payer: Self-pay | Admitting: Radiology

## 2016-05-25 DIAGNOSIS — R0789 Other chest pain: Secondary | ICD-10-CM

## 2016-05-25 DIAGNOSIS — T148XXA Other injury of unspecified body region, initial encounter: Secondary | ICD-10-CM

## 2016-05-25 DIAGNOSIS — S7001XA Contusion of right hip, initial encounter: Secondary | ICD-10-CM | POA: Diagnosis not present

## 2016-05-25 LAB — URINALYSIS, ROUTINE W REFLEX MICROSCOPIC
BILIRUBIN URINE: NEGATIVE
GLUCOSE, UA: NEGATIVE mg/dL
KETONES UR: NEGATIVE mg/dL
Leukocytes, UA: NEGATIVE
NITRITE: NEGATIVE
PH: 6.5 (ref 5.0–8.0)
Protein, ur: NEGATIVE mg/dL
Specific Gravity, Urine: 1.009 (ref 1.005–1.030)

## 2016-05-25 LAB — CBC WITH DIFFERENTIAL/PLATELET
Basophils Absolute: 0 10*3/uL (ref 0.0–0.1)
Basophils Relative: 0 %
EOS ABS: 0.1 10*3/uL (ref 0.0–0.7)
Eosinophils Relative: 2 %
HCT: 38.9 % (ref 36.0–46.0)
HEMOGLOBIN: 12.6 g/dL (ref 12.0–15.0)
LYMPHS ABS: 2.2 10*3/uL (ref 0.7–4.0)
LYMPHS PCT: 27 %
MCH: 31.1 pg (ref 26.0–34.0)
MCHC: 32.4 g/dL (ref 30.0–36.0)
MCV: 96 fL (ref 78.0–100.0)
Monocytes Absolute: 0.7 10*3/uL (ref 0.1–1.0)
Monocytes Relative: 9 %
NEUTROS PCT: 62 %
Neutro Abs: 5 10*3/uL (ref 1.7–7.7)
Platelets: 189 10*3/uL (ref 150–400)
RBC: 4.05 MIL/uL (ref 3.87–5.11)
RDW: 12.7 % (ref 11.5–15.5)
WBC: 8 10*3/uL (ref 4.0–10.5)

## 2016-05-25 LAB — COMPREHENSIVE METABOLIC PANEL
ALK PHOS: 61 U/L (ref 38–126)
ALT: 26 U/L (ref 14–54)
AST: 41 U/L (ref 15–41)
Albumin: 3.8 g/dL (ref 3.5–5.0)
Anion gap: 8 (ref 5–15)
BUN: 12 mg/dL (ref 6–20)
CALCIUM: 8.8 mg/dL — AB (ref 8.9–10.3)
CO2: 22 mmol/L (ref 22–32)
CREATININE: 0.85 mg/dL (ref 0.44–1.00)
Chloride: 109 mmol/L (ref 101–111)
GFR calc non Af Amer: >60 mL/min (ref >60)
GLUCOSE: 99 mg/dL (ref 65–99)
Potassium: 3.9 mmol/L (ref 3.5–5.1)
SODIUM: 139 mmol/L (ref 135–145)
Total Bilirubin: 0.4 mg/dL (ref 0.3–1.2)
Total Protein: 6.8 g/dL (ref 6.5–8.1)

## 2016-05-25 LAB — URINE MICROSCOPIC-ADD ON: Bacteria, UA: NONE SEEN

## 2016-05-25 LAB — POC URINE PREG, ED: Preg Test, Ur: NEGATIVE

## 2016-05-25 MED ORDER — TETANUS-DIPHTH-ACELL PERTUSSIS 5-2.5-18.5 LF-MCG/0.5 IM SUSP
0.5000 mL | Freq: Once | INTRAMUSCULAR | Status: AC
  Administered 2016-05-25: 0.5 mL via INTRAMUSCULAR
  Filled 2016-05-25: qty 0.5

## 2016-05-25 MED ORDER — KETOROLAC TROMETHAMINE 15 MG/ML IJ SOLN
15.0000 mg | Freq: Once | INTRAMUSCULAR | Status: AC
  Administered 2016-05-25: 15 mg via INTRAMUSCULAR
  Filled 2016-05-25: qty 1

## 2016-05-25 MED ORDER — CYCLOBENZAPRINE HCL 10 MG PO TABS: 10.0000 mg | ORAL_TABLET | Freq: Two times a day (BID) | ORAL | 0 refills | Status: DC | PRN

## 2016-05-25 MED ORDER — OXYCODONE-ACETAMINOPHEN 5-325 MG PO TABS
1.0000 | ORAL_TABLET | Freq: Once | ORAL | Status: AC
  Administered 2016-05-25: 1 via ORAL
  Filled 2016-05-25: qty 1

## 2016-05-25 MED ORDER — HYDROCODONE-ACETAMINOPHEN 5-325 MG PO TABS: 1.0000 | ORAL_TABLET | Freq: Four times a day (QID) | ORAL | 0 refills | Status: DC | PRN

## 2016-05-25 NOTE — ED Notes (Signed)
Patient transported to CT 
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
## 2016-05-25 NOTE — ED Notes (Signed)
Dr. Horton at the bedside.  

## 2016-05-25 NOTE — ED Notes (Signed)
Pt ambulated in hall. Pt stead gait while walking although appearing to be drowsy. Pt requesting more pain medicine.

## 2017-02-14 ENCOUNTER — Telehealth (HOSPITAL_COMMUNITY): Payer: Self-pay | Admitting: Emergency Medicine

## 2017-02-14 ENCOUNTER — Ambulatory Visit (HOSPITAL_COMMUNITY)
Admission: EM | Admit: 2017-02-14 | Discharge: 2017-02-14 | Disposition: A | Discharge status: Home / Self Care | Payer: 59 | Attending: Family Medicine | Admitting: Family Medicine

## 2017-02-14 ENCOUNTER — Encounter (HOSPITAL_COMMUNITY): Payer: Self-pay | Admitting: Emergency Medicine

## 2017-02-14 DIAGNOSIS — L309 Dermatitis, unspecified: Secondary | ICD-10-CM

## 2017-02-14 MED ORDER — METHYLPREDNISOLONE ACETATE 80 MG/ML IJ SUSP
80.0000 mg | Freq: Once | INTRAMUSCULAR | Status: AC
  Administered 2017-02-14: 80 mg via INTRAMUSCULAR

## 2017-02-14 MED ORDER — FLUOCINONIDE-E 0.05 % EX CREA: 1.0000 "application " | TOPICAL_CREAM | Freq: Two times a day (BID) | CUTANEOUS | 0 refills | Status: DC

## 2017-02-14 MED ORDER — METHYLPREDNISOLONE ACETATE 80 MG/ML IJ SUSP
INTRAMUSCULAR | Status: AC
  Filled 2017-02-14: qty 1

## 2017-02-14 MED ORDER — PREDNISONE 20 MG PO TABS: ORAL_TABLET | ORAL | 0 refills | Status: DC

## 2017-02-14 NOTE — ED Provider Notes (Signed)
[facility]    CSN: [id number] Arrival date & time: 02/14/17  1058     History   Chief Complaint Chief Complaint  Patient presents with  . Eczema    HPI Vanessa Braun is a 36 y.o. female.   Pt reports an eczema flare up on both her hands for three days. 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 She's had this problem before and now she feels unable to work because of the severity.      Past Medical History:  Diagnosis Date  . Abnormal Pap smear   . Eczema   . GERD (gastroesophageal reflux disease)   . PCOS (polycystic ovarian syndrome)     Patient Active Problem List   Diagnosis Date Noted  . Oligomenorrhea 02/11/2012  . Essential hypertension 02/11/2012  . Primary female infertility 02/11/2012  . Obesity 02/11/2012    Past Surgical History:  Procedure Laterality Date  . ADENOIDECTOMY    . DILATION AND EVACUATION N/A 04/01/2014   Procedure: DILATATION AND EVACUATION;  Surgeon: Robley Fries, MD;  Location: [facility];  Service: Gynecology;  Laterality: N/A;  . GASTRIC BYPASS    . TONSILLECTOMY AND ADENOIDECTOMY  1999    OB History    Gravida Para Term Preterm AB Living   0 0 0 0 0 0   SAB TAB Ectopic Multiple Live Births   0 0 0 0         Home Medications    Prior to Admission medications   Medication Sig Start Date End Date Taking? Authorizing Provider  fluocinonide-emollient (LIDEX-E) 0.05 % cream Apply 1 application topically 2 (two) times daily. 02/14/17   Elvina Sidle, MD  predniSONE (DELTASONE) 20 MG tablet Two daily with food 02/14/17   Elvina Sidle, MD    Family History Family History  Problem Relation Age of Onset  . Diabetes Mother   . Hyperlipidemia Mother   . Hypertension Mother   . Diabetes Father   . Kidney disease Father   . Hypertension Father   . Gestational diabetes Sister   . Depression Sister     Social History Social History  Substance Use Topics  . Smoking status: Never Smoker  .  Smokeless tobacco: Never Used  . Alcohol use Yes     Comment: occ     Allergies   Penicillins   Review of Systems Review of Systems  Skin: Positive for rash.  All other systems reviewed and are negative.    Physical Exam Triage Vital Signs ED Triage Vitals [02/14/17 1128]  Enc Vitals Group     BP 124/88     Pulse Rate (!) 55     Resp      Temp 98.1 F (36.7 C)     Temp Source Oral     SpO2 100 %     Weight      Height      Head Circumference      Peak Flow      Pain Score 7     Pain Loc      Pain Edu?      Excl. in GC?    No data found.   Updated Vital Signs BP 124/88 (BP Location: Left Arm)   Pulse (!) 55   Temp 98.1 F (36.7 C) (Oral)   LMP 01/28/2017 (Approximate)   SpO2 100%    Physical Exam  Constitutional: She is oriented to person, place, and time. She appears well-developed and well-nourished.  HENT:  Right Ear: External ear normal.  Left Ear: External ear normal.  Mouth/Throat: Oropharynx is clear and moist.  Eyes: Conjunctivae and EOM are normal. Pupils are equal, round, and reactive to light.  Neck: Normal range of motion. Neck supple.  Pulmonary/Chest: Effort normal.  Musculoskeletal: She exhibits tenderness. She exhibits no deformity.  Marked eczematous changes diffusely of both hands with exfoliation, fissuring, and diffuse erythema.  Neurological: She is alert and oriented to person, place, and time.  Skin: Skin is warm and dry.  Nursing note and vitals reviewed.    UC Treatments / Results  Labs (all labs ordered are listed, but only abnormal results are displayed) Labs Reviewed - No data to display  EKG  EKG Interpretation None       Radiology No results found.  Procedures Procedures (including critical care time)  Medications Ordered in UC Medications  methylPREDNISolone acetate (DEPO-MEDROL) injection 80 mg (not administered)     Initial Impression / Assessment and Plan / UC Course  I have reviewed the triage  vital signs and the nursing notes.  Pertinent labs & imaging results that were available during my care of the patient were reviewed by me and considered in my medical decision making (see chart for details).     Final Clinical Impressions(s) / UC Diagnoses   Final diagnoses:  Eczema of both hands    New Prescriptions New Prescriptions   FLUOCINONIDE-EMOLLIENT (LIDEX-E) 0.05 % CREAM    Apply 1 application topically 2 (two) times daily.   PREDNISONE (DELTASONE) 20 MG TABLET    Two daily with food     Elvina Sidle, MD 02/14/17 1141

## 2017-02-14 NOTE — ED Triage Notes (Signed)
Pt reports an eczema flare up on both her hands for three days.

## 2017-02-14 NOTE — Telephone Encounter (Signed)
The patient requested that her medications be re-sent to [facility] on [address]

## 2017-07-02 ENCOUNTER — Encounter (HOSPITAL_COMMUNITY): Payer: Self-pay | Admitting: *Deleted

## 2017-07-02 ENCOUNTER — Ambulatory Visit (HOSPITAL_COMMUNITY)
Admission: EM | Admit: 2017-07-02 | Discharge: 2017-07-02 | Disposition: A | Discharge status: Home / Self Care | Payer: 59 | Attending: Family Medicine | Admitting: Family Medicine

## 2017-07-02 DIAGNOSIS — L237 Allergic contact dermatitis due to plants, except food: Secondary | ICD-10-CM | POA: Diagnosis not present

## 2017-07-02 HISTORY — DX: Dermatitis, unspecified: L30.9

## 2017-07-02 MED ORDER — TRIAMCINOLONE ACETONIDE 0.1 % EX CREA: 1.0000 "application " | TOPICAL_CREAM | Freq: Two times a day (BID) | CUTANEOUS | 0 refills | Status: DC

## 2017-07-02 NOTE — ED Notes (Signed)
At bedside for Vanessa Rasmussendavid mabe, np exam of patient's rash at rectal area.

## 2017-07-02 NOTE — Discharge Instructions (Signed)
Use the cream to the affected area twice a day. May also use Tucks wipes after defecation. If stools are hard you still softener such as Colace.

## 2017-07-02 NOTE — ED Triage Notes (Signed)
States went to bathroom in woods 4 days ago and used a leaf to wipe; now has perianal rash and hemorrhoid.  Has applied Neosporin.

## 2017-07-02 NOTE — ED Provider Notes (Signed)
[facility]    CSN: [id number] Arrival date & time: 07/02/17  1456     History   Chief Complaint Chief Complaint  Patient presents with  . Rash    HPI Vanessa Braun is a 36 y.o. female.   36 year old female states that she was in the woods just a few days ago and defecated wiping with a leaf. She has since developed a perianal rash. The itching extends into the sphincter mucosa and making the patient thinks that she may have a hemorrhoid. No other complaints, no trauma, no bleeding.      Past Medical History:  Diagnosis Date  . Abnormal Pap smear   . Chronic dermatitis of hands   . Eczema   . GERD (gastroesophageal reflux disease)   . PCOS (polycystic ovarian syndrome)     Patient Active Problem List   Diagnosis Date Noted  . Oligomenorrhea 02/11/2012  . Essential hypertension 02/11/2012  . Primary female infertility 02/11/2012  . Obesity 02/11/2012    Past Surgical History:  Procedure Laterality Date  . ADENOIDECTOMY    . DILATION AND EVACUATION N/A 04/01/2014   Procedure: DILATATION AND EVACUATION;  Surgeon: Robley FriesVaishali R Mody, MD;  Location: [facility];  Service: Gynecology;  Laterality: N/A;  . GASTRIC BYPASS    . TONSILLECTOMY AND ADENOIDECTOMY  1999    OB History    Gravida Para Term Preterm AB Living   0 0 0 0 0 0   SAB TAB Ectopic Multiple Live Births   0 0 0 0         Home Medications    Prior to Admission medications   Medication Sig Start Date End Date Taking? Authorizing Provider  CALCITRIOL PO Take by mouth.   Yes [provider]  Cyanocobalamin (VITAMIN B-12 PO) Take by mouth.   Yes [provider]  DIPHENHYDRAMINE HCL PO Take by mouth.   Yes [provider]  HYDROXYZINE HCL PO Take by mouth.   Yes [provider]  fluocinonide-emollient (LIDEX-E) 0.05 % cream Apply 1 application topically 2 (two) times daily. 02/14/17   Elvina SidleLauenstein, Kurt, MD  predniSONE (DELTASONE) 20 MG tablet Two daily  with food 02/14/17   Elvina SidleLauenstein, Kurt, MD  triamcinolone cream (KENALOG) 0.1 % Apply 1 application topically 2 (two) times daily. 07/02/17   Hayden RasmussenMabe, Echo Propp, NP    Family History Family History  Problem Relation Age of Onset  . Diabetes Mother   . Hyperlipidemia Mother   . Hypertension Mother   . Diabetes Father   . Kidney disease Father   . Hypertension Father   . Gestational diabetes Sister   . Depression Sister     Social History Social History  Substance Use Topics  . Smoking status: Never Smoker  . Smokeless tobacco: Never Used  . Alcohol use Yes     Comment: occasionally     Allergies   Other and Penicillins   Review of Systems Review of Systems  Constitutional: Negative.   Respiratory: Negative.   Gastrointestinal: Negative.   Genitourinary: Negative for decreased urine volume, difficulty urinating, flank pain, frequency, hematuria, pelvic pain, vaginal bleeding and vaginal discharge.  Skin: Negative.   All other systems reviewed and are negative.    Physical Exam Triage Vital Signs ED Triage Vitals  Enc Vitals Group     BP 07/02/17 1554 113/67     Pulse Rate 07/02/17 1554 (!) 54     Resp 07/02/17 1554 16     Temp 07/02/17  1554 98.3 F (36.8 C)     Temp Source 07/02/17 1554 Oral     SpO2 07/02/17 1554 100 %     Weight --      Height --      Head Circumference --      Peak Flow --      Pain Score 07/02/17 1555 7     Pain Loc --      Pain Edu? --      Excl. in GC? --    No data found.   Updated Vital Signs BP 113/67   Pulse (!) 54   Temp 98.3 F (36.8 C) (Oral)   Resp 16   LMP 06/02/2017 (Exact Date)   SpO2 100%   Visual Acuity Right Eye Distance:   Left Eye Distance:   Bilateral Distance:    Right Eye Near:   Left Eye Near:    Bilateral Near:     Physical Exam  Constitutional: She is oriented to person, place, and time. She appears well-developed and well-nourished. No distress.  Eyes: EOM are normal.  Neck: Normal range of motion.  Neck supple.  Cardiovascular: Normal rate.   Pulmonary/Chest: Effort normal. No respiratory distress.  Genitourinary:  Genitourinary Comments: Vanessa Pace, RN present during exam. There is a shiny area of pigmentation surrounding the perianal area including a portion of the buttocks. No evidence of external lesions or hemorrhoids. No bleeding. Just a few small papules as well are present.  Musculoskeletal: She exhibits no edema.  Neurological: She is alert and oriented to person, place, and time. She exhibits normal muscle tone.  Skin: Skin is warm and dry.  Psychiatric: She has a normal mood and affect.  Nursing note and vitals reviewed.    UC Treatments / Results  Labs (all labs ordered are listed, but only abnormal results are displayed) Labs Reviewed - No data to display  EKG  EKG Interpretation None       Radiology No results found.  Procedures Procedures (including critical care time)  Medications Ordered in UC Medications - No data to display   Initial Impression / Assessment and Plan / UC Course  I have reviewed the triage vital signs and the nursing notes.  Pertinent labs & imaging results that were available during my care of the patient were reviewed by me and considered in my medical decision making (see chart for details). Use the cream to the affected area twice a day. May also use Tucks wipes after defecation. If stools are hard you still softener such as Colace.       Final Clinical Impressions(s) / UC Diagnoses   Final diagnoses:  Allergic contact dermatitis due to plants, except food    New Prescriptions New Prescriptions   TRIAMCINOLONE CREAM (KENALOG) 0.1 %    Apply 1 application topically 2 (two) times daily.     Controlled Substance Prescriptions [place] Controlled Substance Registry consulted? Not Applicable   Hayden Rasmussen, NP 07/02/17 1623

## 2017-11-27 ENCOUNTER — Encounter (HOSPITAL_COMMUNITY): Payer: Self-pay | Admitting: *Deleted

## 2017-11-27 ENCOUNTER — Inpatient Hospital Stay (HOSPITAL_COMMUNITY)
Admission: AD | Admit: 2017-11-27 | Discharge: 2017-11-27 | Disposition: A | Discharge status: Home / Self Care | Payer: Medicaid Other | Source: Ambulatory Visit | Attending: Obstetrics & Gynecology | Admitting: Obstetrics & Gynecology

## 2017-11-27 ENCOUNTER — Inpatient Hospital Stay (HOSPITAL_COMMUNITY): Payer: Medicaid Other

## 2017-11-27 DIAGNOSIS — Z880 Allergy status to penicillin: Secondary | ICD-10-CM | POA: Insufficient documentation

## 2017-11-27 DIAGNOSIS — O209 Hemorrhage in early pregnancy, unspecified: Secondary | ICD-10-CM | POA: Insufficient documentation

## 2017-11-27 DIAGNOSIS — Z3A09 9 weeks gestation of pregnancy: Secondary | ICD-10-CM | POA: Diagnosis not present

## 2017-11-27 LAB — URINALYSIS, ROUTINE W REFLEX MICROSCOPIC
BACTERIA UA: NONE SEEN
BILIRUBIN URINE: NEGATIVE
GLUCOSE, UA: NEGATIVE mg/dL
KETONES UR: NEGATIVE mg/dL
LEUKOCYTES UA: NEGATIVE
NITRITE: NEGATIVE
PROTEIN: NEGATIVE mg/dL
Specific Gravity, Urine: 1.008 (ref 1.005–1.030)
Squamous Epithelial / LPF: NONE SEEN
pH: 6 (ref 5.0–8.0)

## 2017-11-27 LAB — CBC
HEMATOCRIT: 34.2 % — AB (ref 36.0–46.0)
Hemoglobin: 11 g/dL — ABNORMAL LOW (ref 12.0–15.0)
MCH: 31 pg (ref 26.0–34.0)
MCHC: 32.2 g/dL (ref 30.0–36.0)
MCV: 96.3 fL (ref 78.0–100.0)
PLATELETS: 230 10*3/uL (ref 150–400)
RBC: 3.55 MIL/uL — AB (ref 3.87–5.11)
RDW: 12.8 % (ref 11.5–15.5)
WBC: 7.5 10*3/uL (ref 4.0–10.5)

## 2017-11-27 LAB — WET PREP, GENITAL
Clue Cells Wet Prep HPF POC: NONE SEEN
SPERM: NONE SEEN
TRICH WET PREP: NONE SEEN
YEAST WET PREP: NONE SEEN

## 2017-11-27 LAB — HCG, QUANTITATIVE, PREGNANCY: hCG, Beta Chain, Quant, S: 17806 m[IU]/mL — ABNORMAL HIGH (ref <5)

## 2017-11-27 LAB — POCT PREGNANCY, URINE: Preg Test, Ur: POSITIVE — AB

## 2017-11-27 LAB — ABO/RH: ABO/RH(D): AB POS

## 2017-11-27 NOTE — Discharge Instructions (Signed)
Expect ultrasound to call you and schedule an ultrasound appointment. Take Tylenol 325 mg 2 tablets by mouth every 4 hours if needed for pain. Drink at least 8 8-oz glasses of water every day. Return if you have heaving vaginal bleeding or severe lower abdominal pain.

## 2017-11-27 NOTE — MAU Provider Note (Signed)
History     CSN: [id number]  Arrival date and time: 11/27/17 4098   First Provider Initiated Contact with Patient 11/27/17 1842      Chief Complaint  Patient presents with  . Vaginal Bleeding   HPI Vanessa Braun 37 y.o. [redacted]w[redacted]d  Comes to MAU today with vaginal bleeding and has passed one small clot at home.  The bleeding was in her underwear.  Also having some lower abdominal cramping that she has not had in this pregnancy.  Previously had a miscarriage at approx. 10 weeks.  [facility] and has an appointment to begin care there later this month.  OB History    Gravida Para Term Preterm AB Living   3 0 0 0 2 0   SAB TAB Ectopic Multiple Live Births   1 0 0 0        Past Medical History:  Diagnosis Date  . Abnormal Pap smear   . Chronic dermatitis of hands   . Eczema   . GERD (gastroesophageal reflux disease)   . PCOS (polycystic ovarian syndrome)     Past Surgical History:  Procedure Laterality Date  . ADENOIDECTOMY    . DILATION AND EVACUATION N/A 04/01/2014   Procedure: DILATATION AND EVACUATION;  Surgeon: Robley Fries, MD;  Location: [facility];  Service: Gynecology;  Laterality: N/A;  . GASTRIC BYPASS    . TONSILLECTOMY AND ADENOIDECTOMY  1999    Family History  Problem Relation Age of Onset  . Diabetes Mother   . Hyperlipidemia Mother   . Hypertension Mother   . Diabetes Father   . Kidney disease Father   . Hypertension Father   . Gestational diabetes Sister   . Depression Sister     Social History   Tobacco Use  . Smoking status: Never Smoker  . Smokeless tobacco: Never Used  Substance Use Topics  . Alcohol use: No    Frequency: Never    Comment: occasionally  . Drug use: No    Allergies:  Allergies  Allergen Reactions  . Other     "anything antibacterial"; some creams for dermatitis  . Penicillins Itching and Rash    Medications Prior to Admission  Medication Sig Dispense Refill Last Dose   . CALCITRIOL PO Take by mouth.     . Cyanocobalamin (VITAMIN B-12 PO) Take by mouth.     Marland Kitchen DIPHENHYDRAMINE HCL PO Take by mouth.     . fluocinonide-emollient (LIDEX-E) 0.05 % cream Apply 1 application topically 2 (two) times daily. 60 g 0   . HYDROXYZINE HCL PO Take by mouth.     . predniSONE (DELTASONE) 20 MG tablet Two daily with food 10 tablet 0   . triamcinolone cream (KENALOG) 0.1 % Apply 1 application topically 2 (two) times daily. 30 g 0     Review of Systems  Constitutional: Negative for fever.  Gastrointestinal: Positive for abdominal pain. Negative for diarrhea, nausea and vomiting.  Genitourinary: Positive for vaginal bleeding. Negative for dysuria and vaginal discharge.   Physical Exam   Blood pressure 121/72, pulse 60, temperature 98.2 F (36.8 C), temperature source Oral, resp. rate 18, height 5\' 9"  (1.753 m), weight 246 lb (111.6 kg), last menstrual period 09/24/2017.  Physical Exam  Nursing note and vitals reviewed. Constitutional: She is oriented to person, place, and time. She appears well-developed and well-nourished.  HENT:  Head: Normocephalic.  Eyes: EOM are normal.  Neck: Neck supple.  GI: Soft.  There is no tenderness. There is no rebound and no guarding.  Genitourinary:  Genitourinary Comments: Speculum exam: Vagina - Small amount of bloody discharge, no odor Cervix - No contact bleeding Bimanual exam: Cervix closed Uterus non tender, unable to size due to habitus Adnexa non tender, no masses bilaterally GC/Chlam, wet prep done Chaperone present for exam.   Musculoskeletal: Normal range of motion.  Neurological: She is alert and oriented to person, place, and time.  Skin: Skin is warm and dry.  Psychiatric: She has a normal mood and affect.    MAU Course  Procedures  MDM Results for orders placed or performed during the hospital encounter of 11/27/17 (from the past 24 hour(s))  Urinalysis, Routine w reflex microscopic     Status: Abnormal    Collection Time: 11/27/17  6:15 PM  Result Value Ref Range   Color, Urine YELLOW YELLOW   APPearance CLEAR CLEAR   Specific Gravity, Urine 1.008 1.005 - 1.030   pH 6.0 5.0 - 8.0   Glucose, UA NEGATIVE NEGATIVE mg/dL   Hgb urine dipstick MODERATE (A) NEGATIVE   Bilirubin Urine NEGATIVE NEGATIVE   Ketones, ur NEGATIVE NEGATIVE mg/dL   Protein, ur NEGATIVE NEGATIVE mg/dL   Nitrite NEGATIVE NEGATIVE   Leukocytes, UA NEGATIVE NEGATIVE   RBC / HPF 0-5 0 - 5 RBC/hpf   WBC, UA 0-5 0 - 5 WBC/hpf   Bacteria, UA NONE SEEN NONE SEEN   Squamous Epithelial / LPF NONE SEEN NONE SEEN   Mucus PRESENT   Pregnancy, urine POC     Status: Abnormal   Collection Time: 11/27/17  6:26 PM  Result Value Ref Range   Preg Test, Ur POSITIVE (A) NEGATIVE  Wet prep, genital     Status: Abnormal   Collection Time: 11/27/17  6:44 PM  Result Value Ref Range   Yeast Wet Prep HPF POC NONE SEEN NONE SEEN   Trich, Wet Prep NONE SEEN NONE SEEN   Clue Cells Wet Prep HPF POC NONE SEEN NONE SEEN   WBC, Wet Prep HPF POC FEW (A) NONE SEEN   Sperm NONE SEEN   CBC     Status: Abnormal   Collection Time: 11/27/17  6:53 PM  Result Value Ref Range   WBC 7.5 4.0 - 10.5 K/uL   RBC 3.55 (L) 3.87 - 5.11 MIL/uL   Hemoglobin 11.0 (L) 12.0 - 15.0 g/dL   HCT 16.1 (L) 09.6 - 04.5 %   MCV 96.3 78.0 - 100.0 fL   MCH 31.0 26.0 - 34.0 pg   MCHC 32.2 30.0 - 36.0 g/dL   RDW 40.9 81.1 - 91.4 %   Platelets 230 150 - 400 K/uL  hCG, quantitative, pregnancy     Status: Abnormal   Collection Time: 11/27/17  6:53 PM  Result Value Ref Range   hCG, Beta Chain, Quant, S 17,806 (H) <5 mIU/mL  ABO/Rh     Status: None (Preliminary result)   Collection Time: 11/27/17  6:53 PM  Result Value Ref Range   ABO/RH(D)      AB POS Performed at [facility], [address], [place], [place] [address]     CLINICAL DATA:  Pregnant patient with bleeding/spotting.  EXAM: OBSTETRIC <14 WK [place] AND TRANSVAGINAL OB US  TECHNIQUE: Both  transabdominal and transvaginal ultrasound examinations were performed for complete evaluation of the gestation as well as the maternal uterus, adnexal regions, and pelvic cul-de-sac. Transvaginal technique was performed to assess early pregnancy.  COMPARISON:  None.  FINDINGS: Intrauterine gestational sac: Single  Yolk sac:  Visualized.  Embryo:  Visualized.  Cardiac Activity: Not Visualized.  Heart Rate:   bpm  MSD:   mm    w     d  CRL:  6.2 mm   6 w   3 d                  [facility]  Subchorionic hemorrhage: There is a rounded region of mild decreased subchorionic echogenicity which results in a convexity of the gestational sac.  Maternal uterus/adnexae: Normal.  Corpus luteum cyst on the right.  IMPRESSION: 1. An intrauterine pregnancy is identified. A yolk sac and fetus are seen. The crown-rump length is 6.2 mm. No cardiac activity is identified within the fetus. These findings are suspicious but not diagnostic of non viability. Recommend an ultrasound in 7-10 days for further characterization. 2. Rounded region of decreased echogenicity in the subchorionic region could represent an adjacent fibroid, an unusual subchorionic hemorrhage, or a contraction. Recommend attention on follow-up.  Blood type AB positive  Discussed with client and her partner the usual development of a pregnancy.  Currently the baby has developed but it is unknown if this will be a healthy pregnancy or a pregnancy that does not progress.  Will plan to repeat ultrasound in one week as she is currently having vaginal bleeding.  It could be the bleeding is from a possible subchorionic hemorrhage.  Assessment and Plan  Bleeding in pregnancy, first trimester IUP at 4865w3d but without a FHT at present  Plan Will repeat ultrasound in 7 days - will order as an outpatient and will be seen in the clinic afterwards for results. Pelvic Rest - no sex, no tampons, no  douching Return if you have heavy bleeding or severe lower abdominal pain. Take Tylenol 325 mg 2 tablets by mouth every 4 hours if needed for pain. Drink at least 8 8-oz glasses of water every day.    Currie Pariserri L Burleson 11/27/2017, 6:53 PM

## 2017-11-27 NOTE — MAU Note (Signed)
Pt reports she went to the [facility] and she had blood in her underwear and when she wiped. reports some mild cramping on her right side earlier but not now.

## 2017-11-28 LAB — GC/CHLAMYDIA PROBE AMP (~~LOC~~) NOT AT ARMC
CHLAMYDIA, DNA PROBE: NEGATIVE
NEISSERIA GONORRHEA: NEGATIVE

## 2017-11-28 LAB — RPR: RPR: NONREACTIVE

## 2017-11-28 LAB — HIV ANTIBODY (ROUTINE TESTING W REFLEX): HIV Screen 4th Generation wRfx: NONREACTIVE

## 2017-12-02 ENCOUNTER — Ambulatory Visit (HOSPITAL_COMMUNITY)
Admission: RE | Admit: 2017-12-02 | Discharge: 2017-12-02 | Disposition: A | Discharge status: Home / Self Care | Payer: Medicaid Other | Source: Ambulatory Visit | Attending: Nurse Practitioner | Admitting: Nurse Practitioner

## 2017-12-02 ENCOUNTER — Ambulatory Visit: Payer: Self-pay | Admitting: *Deleted

## 2017-12-02 DIAGNOSIS — O209 Hemorrhage in early pregnancy, unspecified: Secondary | ICD-10-CM

## 2017-12-02 DIAGNOSIS — O3680X0 Pregnancy with inconclusive fetal viability, not applicable or unspecified: Secondary | ICD-10-CM

## 2017-12-02 DIAGNOSIS — Z712 Person consulting for explanation of examination or test findings: Secondary | ICD-10-CM

## 2017-12-02 NOTE — Progress Notes (Signed)
US results reviewed by Sharen CounterLisa Leftwich-Kirby, CNM along with consult w/Dr. Vergie LivingPickens.  Pt and significant other informed of results showing strong possibility of failed pregnancy/miscarriage. Because it has been only 5 days since last scan, recommended plan of care is to have repeat US in 1 week for final confirmation of pregnancy status. Pt agreed to plan of care. US appt scheduled on 2/15 @ 1300. Pt reports that she is having light/moderate vaginal bleeding and denies pain. Pt advised to return to hospital for heavy bleeding or abdominal/pelvic pain. She voiced understanding.

## 2017-12-09 ENCOUNTER — Ambulatory Visit (HOSPITAL_COMMUNITY)
Admission: RE | Admit: 2017-12-09 | Discharge: 2017-12-09 | Disposition: A | Discharge status: Home / Self Care | Payer: Medicaid Other | Source: Ambulatory Visit | Attending: Advanced Practice Midwife | Admitting: Advanced Practice Midwife

## 2017-12-09 ENCOUNTER — Ambulatory Visit (INDEPENDENT_AMBULATORY_CARE_PROVIDER_SITE_OTHER): Payer: Medicaid Other | Admitting: Student

## 2017-12-09 DIAGNOSIS — O021 Missed abortion: Secondary | ICD-10-CM

## 2017-12-09 DIAGNOSIS — O3680X0 Pregnancy with inconclusive fetal viability, not applicable or unspecified: Secondary | ICD-10-CM

## 2017-12-09 MED ORDER — MISOPROSTOL 200 MCG PO TABS: 800.0000 ug | ORAL_TABLET | Freq: Once | ORAL | 1 refills | Status: DC

## 2017-12-09 MED ORDER — ONDANSETRON HCL 4 MG PO TABS: 4.0000 mg | ORAL_TABLET | Freq: Three times a day (TID) | ORAL | 0 refills | Status: DC | PRN

## 2017-12-09 MED ORDER — OXYCODONE-ACETAMINOPHEN 5-325 MG PO TABS: 1.0000 | ORAL_TABLET | ORAL | 0 refills | Status: DC | PRN

## 2017-12-09 NOTE — Patient Instructions (Addendum)
-follow up US in two weeks -Take Medication for nausea and vomiting and pain -If she develops heavy bleeding (more than 2 pads in an hour); come to MAU.    FACTS YOU SHOULD KNOW  WHAT IS AN EARLY PREGNANCY FAILURE? Once the egg is fertilized with the sperm and begins to develop, it attaches to the lining of the uterus. This early pregnancy tissue may not develop into an embryo (the beginning stage of a baby). Sometimes an embryo does develop but does not continue to grow. These problems can be seen on ultrasound.   MANAGEMNT OF EARLY PREGNANCY FAILURE: About 4 out of 100 (0.25%) women will have a pregnancy loss in her lifetime.  One in five pregnancies is found to be an early pregnancy failure.  There are 3 ways to care for an early pregnancy failure:   (1) Surgery, (2) Medicine, (3) Waiting for you to pass the pregnancy on your own. The decision as to how to proceed after being diagnosed with and early pregnancy failure is an individual one.  The decision can be made only after appropriate counseling.  You need to weigh the pros and cons of the 3 choices. Then you can make the choice that works for you. SURGERY (D&E) . Procedure over in 1 day . Requires being put to sleep . Bleeding may be light . Possible problems during surgery, including injury to womb(uterus) . Care provider has more control Medicine (CYTOTEC) . The complete procedure may take days to weeks . No Surgery . Bleeding may be heavy at times . There may be drug side effects . Patient has more control Waiting . You may choose to wait, in which case your own body may complete the passing of the abnormal early pregnancy on its own in about 2-4 weeks . Your bleeding may be heavy at times . There is a small possibility that you may need surgery if the bleeding is too much or not all of the pregnancy has passed. CYTOTEC MANAGEMENT Prostaglandins (cytotec) are the most widely used drug for this purpose. They cause the uterus  to cramp and contract. You will place the medicine yourself inside your vagina in the privacy of your home. Empting of the uterus should occur within 3 days but the process may continue for several weeks. The bleeding may seem heavy at times. POSSIBLE SIDE EFFECTS FROM CYTOTEC . Nausea   Vomiting . Diarrhea Fever . Chills  Hot Flashes Side effects  from the process of the early pregnancy failure include: . Cramping  Bleeding . Headaches  Dizziness RISKS: This is a low risk procedure. Less than 1 in 100 women has a complication. An incomplete passage of the early pregnancy may occur. Also, Hemorrhage (heavy bleeding) could happen.  Rarely the pregnancy will not be passed completely. Excessively heavy bleeding may occur.  Your doctor may need to perform surgery to empty the uterus (D&E). Afterwards: Everybody will feel differently after the early pregnancy completion. You may have soreness or cramps for a day or two. You may have soreness or cramps for day or two.  You may have light bleeding for up to 2 weeks. You may be as active as you feel like being. If you have any of the following problems you may call Maternity Admissions Unit at [phone]. . If you have pain that does not get better  with pain medication . Bleeding that soaks through 2 thick full-sized sanitary pads in an hour . Cramps that last longer than  2 days . Foul smelling discharge . Fever above 100.4 degrees F Even if you do not have any of these symptoms, you should have a follow-up exam to make sure you are healing properly. This appointment will be made for you before you leave the hospital. Your next normal period will start again in 4-6 week after the loss. You can get pregnant soon after the loss, so use birth control right away. Finally: Make sure all your questions are answered before during and after any procedure. Follow up with medical care and family planning methods.

## 2017-12-09 NOTE — Progress Notes (Signed)
Patient ID: Vanessa HeadlandBeverly Burnett Markiewicz, female   DOB: [birth year], 37 y.o.   MRN: [id number]  HPI Vanessa Braun is a 37 y.o. female here for follow-up after her US today following a failed pregnancy. US today shows definitive failed pregnancy with POC.  HPI Patient still with some staining; dark brown and dark red. No pain today. Denies fever, chills, N/V or foul-smelling discharge.  Past Medical History:  Diagnosis Date  . Abnormal Pap smear   . Chronic dermatitis of hands   . Eczema   . GERD (gastroesophageal reflux disease)   . PCOS (polycystic ovarian syndrome)     Past Surgical History:  Procedure Laterality Date  . ADENOIDECTOMY    . DILATION AND EVACUATION N/A 04/01/2014   Procedure: DILATATION AND EVACUATION;  Surgeon: Robley FriesVaishali R Mody, MD;  Location: [facility];  Service: Gynecology;  Laterality: N/A;  . GASTRIC BYPASS    . TONSILLECTOMY AND ADENOIDECTOMY  1999    Family History  Problem Relation Age of Onset  . Diabetes Mother   . Hyperlipidemia Mother   . Hypertension Mother   . Diabetes Father   . Kidney disease Father   . Hypertension Father   . Gestational diabetes Sister   . Depression Sister     Social History Social History   Tobacco Use  . Smoking status: Never Smoker  . Smokeless tobacco: Never Used  Substance Use Topics  . Alcohol use: No    Frequency: Never    Comment: occasionally  . Drug use: No    Allergies  Allergen Reactions  . Other     "anything antibacterial"; some creams for dermatitis  . Penicillins Itching and Rash    Current Outpatient Medications  Medication Sig Dispense Refill  . CALCITRIOL PO Take by mouth.    . Cyanocobalamin (VITAMIN B-12 PO) Take by mouth.    Marland Kitchen. DIPHENHYDRAMINE HCL PO Take by mouth.    . fluocinonide-emollient (LIDEX-E) 0.05 % cream Apply 1 application topically 2 (two) times daily. 60 g 0  . HYDROXYZINE HCL PO Take by mouth.    . misoprostol (CYTOTEC) 200 MCG tablet Take 4 tablets (800 mcg total) by mouth  once for 1 dose. 4 tablet 1  . ondansetron (ZOFRAN) 4 MG tablet Take 1 tablet (4 mg total) by mouth every 8 (eight) hours as needed for nausea or vomiting. 20 tablet 0  . oxyCODONE-acetaminophen (PERCOCET/ROXICET) 5-325 MG tablet Take 1 tablet by mouth every 4 (four) hours as needed for severe pain. 4 tablet 0  . predniSONE (DELTASONE) 20 MG tablet Two daily with food 10 tablet 0  . triamcinolone cream (KENALOG) 0.1 % Apply 1 application topically 2 (two) times daily. 30 g 0   No current facility-administered medications for this visit.     Review of Systems Review of Systems  Constitutional: Negative.   HENT: Negative.   Eyes: Negative.   Respiratory: Negative.   Cardiovascular: Negative.   Genitourinary: Positive for vaginal discharge.    Last menstrual period 09/24/2017.  Physical Exam Physical Exam  Constitutional: She is oriented to person, place, and time. She appears well-developed.  HENT:  Head: Normocephalic.  Eyes: Pupils are equal, round, and reactive to light.  Neck: Normal range of motion.  Pulmonary/Chest: Effort normal.  Abdominal: Soft.  Musculoskeletal: Normal range of motion.  Neurological: She is alert and oriented to person, place, and time.  Skin: Skin is warm and dry.  Psychiatric: She has a normal mood and affect.    Data  Reviewed -[place] report reviewed, recent labs reviewed (CBC), Blood type  Assessment    -Miscarriage with retained products of conception.     Plan    - Discussed options with patient (D and C vs. cytotec vs. Expectant management). Patient has used cytotec before for miscarriage; she feels comfortable using it again and knows how to place it vaginally.  -CBC not repeated today as Hgb less than 12 days ago was 11.0 and patient is asymptomatic and has not had significant bleeding since then.  -Zofran and percocet ordered for pain.  -Consulted with Dr. Jolayne Panther, who agrees with plan of care.  -Patient to come back in a week for  repeat US and nurse visit to review [place] results, and then to see a provider within 1 to 2 weeks for a follow-up from an SAB  Charlesetta Garibaldi Kooistra 12/09/2017, 4:56 PM     Early Intrauterine Pregnancy Failure Protocol X  Documented intrauterine pregnancy failure less than or equal to [redacted] weeks   gestation  X  No serious current illness  X  Baseline Hgb greater than or equal to 10g/dl  X  Patient has easily accessible transportation to the hospital  X  Clear preference  X  Practitioner/physician deems patient reliable  X  Counseling by practitioner or physician  X  Patient education by RN  _  Consent form signed       Rho-Gam given by RN if indicated    Medication dispensed  X  Cytotec 800 mcg Intravaginally by patient at home       Intravaginally by NP in MAU       Rectally by patient at home       Rectally by RN in MAU   Reviewed with pt cytotec procedure.  Pt verbalizes that she lives close to the hospital and has transportation readily available.  Pt appears reliable and verbalizes understanding and agrees with plan of care

## 2017-12-09 NOTE — Progress Notes (Deleted)
Patient ID: Vanessa Braun, female   DOB: Mar 11, 1981, 37 y.o.   MRN: [id number]

## 2017-12-09 NOTE — Progress Notes (Signed)
Pt came in for US results a single intrauterine gestational sac which now contains an embryo without cardiac activity. The yolk sac is no longer visualized and the fetal pole is decreased in size from previous exam. Findings meet definitive criteria for failed Pregnancy. Discussed with Jake SamplesKatheryn Kooistra who saw pt and discussed plan of care.         Scheduled pt for follow up scan for possible retained products on 12/15/17 at 1pm.

## 2017-12-15 ENCOUNTER — Other Ambulatory Visit: Payer: Self-pay | Admitting: Student

## 2017-12-15 ENCOUNTER — Ambulatory Visit: Payer: Self-pay | Admitting: *Deleted

## 2017-12-15 ENCOUNTER — Ambulatory Visit (HOSPITAL_COMMUNITY)
Admission: RE | Admit: 2017-12-15 | Discharge: 2017-12-15 | Disposition: A | Discharge status: Home / Self Care | Payer: Medicaid Other | Source: Ambulatory Visit | Attending: Student | Admitting: Student

## 2017-12-15 ENCOUNTER — Ambulatory Visit: Payer: Self-pay

## 2017-12-15 DIAGNOSIS — O021 Missed abortion: Secondary | ICD-10-CM | POA: Insufficient documentation

## 2017-12-15 DIAGNOSIS — Z3A01 Less than 8 weeks gestation of pregnancy: Secondary | ICD-10-CM | POA: Insufficient documentation

## 2017-12-15 DIAGNOSIS — O039 Complete or unspecified spontaneous abortion without complication: Secondary | ICD-10-CM

## 2017-12-15 MED ORDER — MISOPROSTOL 200 MCG PO TABS: 200.0000 ug | ORAL_TABLET | Freq: Four times a day (QID) | ORAL | 0 refills | Status: DC

## 2017-12-15 NOTE — Progress Notes (Signed)
Here for us results. Reviewed with Luna KitchensKathryn Kooistra, CNM and informed patient us still shows gestational sac but no embryo meaning non viable pregnancy. She did take cytotec bucally and had a lot of bleeding for 2-3 days then it lessened and now is very light. She denies pain. Informed patient per Samara DeistKathryn plan is cbc today, repeat cytotec bucally, repeat us in one week and come to office for results of us and plan. Discussed to go to [facility] for severe pain or heavy bleeding  And that if she still has retained products after next us may need d&C.

## 2017-12-16 ENCOUNTER — Ambulatory Visit: Payer: Self-pay

## 2017-12-16 LAB — CBC
Hematocrit: 35.9 % (ref 34.0–46.6)
Hemoglobin: 11.7 g/dL (ref 11.1–15.9)
MCH: 30.5 pg (ref 26.6–33.0)
MCHC: 32.6 g/dL (ref 31.5–35.7)
MCV: 94 fL (ref 79–97)
PLATELETS: 197 10*3/uL (ref 150–379)
RBC: 3.84 x10E6/uL (ref 3.77–5.28)
RDW: 13.3 % (ref 12.3–15.4)
WBC: 6.6 10*3/uL (ref 3.4–10.8)

## 2017-12-16 NOTE — Progress Notes (Signed)
Reviewed patient's US and clinical course over the past week with Dr. Macon LargeAnyanwu, who thinks that patient could benefit from taking cytotec one more time. Will repeat dose, with strict instructions, and then have patient come back in 1 week for US and follow-up nurse visit afterwards.  Luna KitchensKathryn Kooistra CNM

## 2017-12-22 ENCOUNTER — Ambulatory Visit (HOSPITAL_COMMUNITY)
Admission: RE | Admit: 2017-12-22 | Discharge: 2017-12-22 | Disposition: A | Discharge status: Home / Self Care | Payer: Medicaid Other | Source: Ambulatory Visit | Attending: Student | Admitting: Student

## 2017-12-22 ENCOUNTER — Other Ambulatory Visit: Payer: Self-pay | Admitting: Obstetrics & Gynecology

## 2017-12-22 ENCOUNTER — Other Ambulatory Visit: Payer: Self-pay

## 2017-12-22 ENCOUNTER — Encounter (HOSPITAL_COMMUNITY): Payer: Self-pay | Admitting: Anesthesiology

## 2017-12-22 ENCOUNTER — Telehealth (HOSPITAL_COMMUNITY): Payer: Self-pay

## 2017-12-22 ENCOUNTER — Ambulatory Visit: Payer: Self-pay

## 2017-12-22 ENCOUNTER — Ambulatory Visit (INDEPENDENT_AMBULATORY_CARE_PROVIDER_SITE_OTHER): Payer: Medicaid Other | Admitting: Obstetrics & Gynecology

## 2017-12-22 DIAGNOSIS — O034 Incomplete spontaneous abortion without complication: Secondary | ICD-10-CM

## 2017-12-22 DIAGNOSIS — O039 Complete or unspecified spontaneous abortion without complication: Secondary | ICD-10-CM | POA: Insufficient documentation

## 2017-12-22 NOTE — Telephone Encounter (Signed)
Called patient to give her surgery date and time, no answer, left voicemail with basic pre op instruction advised her to call back if she had any question.

## 2017-12-22 NOTE — Telephone Encounter (Signed)
-----   Message from Adam PhenixJames G Arnold, MD sent at 12/22/2017 11:59 AM EST ----- Please schedule suction D&C for incomplete ab asap

## 2017-12-22 NOTE — Progress Notes (Signed)
Patient ID: Vanessa Braun, female   DOB: [birth year], 37 y.o.   MRN: [id number]  Cc: incomplete miscarriage  HPI Vanessa Braun is a 37 y.o. female.  [id number] Patient's last menstrual period was 09/24/2017. 3530w5d US was repeated today and POC is still present after second dose of cytotec last week. I offered to schedule D&C for surgical completion. No pain or bleeding noted HPI  Past Medical History:  Diagnosis Date  . Abnormal Pap smear   . Chronic dermatitis of hands   . Eczema   . GERD (gastroesophageal reflux disease)   . PCOS (polycystic ovarian syndrome)     Past Surgical History:  Procedure Laterality Date  . ADENOIDECTOMY    . DILATION AND EVACUATION N/A 04/01/2014   Procedure: DILATATION AND EVACUATION;  Surgeon: Robley FriesVaishali R Mody, MD;  Location: [facility];  Service: Gynecology;  Laterality: N/A;  . GASTRIC BYPASS    . TONSILLECTOMY AND ADENOIDECTOMY  1999    Family History  Problem Relation Age of Onset  . Diabetes Mother   . Hyperlipidemia Mother   . Hypertension Mother   . Diabetes Father   . Kidney disease Father   . Hypertension Father   . Gestational diabetes Sister   . Depression Sister     Social History Social History   Tobacco Use  . Smoking status: Never Smoker  . Smokeless tobacco: Never Used  Substance Use Topics  . Alcohol use: No    Frequency: Never    Comment: occasionally  . Drug use: No    Allergies  Allergen Reactions  . Other     "anything antibacterial"; some creams for dermatitis  . Penicillins Itching and Rash    Current Outpatient Medications  Medication Sig Dispense Refill  . CALCITRIOL PO Take by mouth.    . Cyanocobalamin (VITAMIN B-12 PO) Take by mouth.    Marland Kitchen. DIPHENHYDRAMINE HCL PO Take by mouth.    . fluocinonide-emollient (LIDEX-E) 0.05 % cream Apply 1 application topically 2 (two) times daily. 60 g 0  . HYDROXYZINE HCL PO Take by mouth.    . misoprostol (CYTOTEC) 200 MCG tablet Take 1 tablet (200 mcg total)  by mouth 4 (four) times daily. Let 4 tablets dissolve in mouth once. Place between cheek and gum for 30 min. 4 tablet 0  . ondansetron (ZOFRAN) 4 MG tablet Take 1 tablet (4 mg total) by mouth every 8 (eight) hours as needed for nausea or vomiting. 20 tablet 0  . oxyCODONE-acetaminophen (PERCOCET/ROXICET) 5-325 MG tablet Take 1 tablet by mouth every 4 (four) hours as needed for severe pain. 4 tablet 0  . predniSONE (DELTASONE) 20 MG tablet Two daily with food 10 tablet 0  . triamcinolone cream (KENALOG) 0.1 % Apply 1 application topically 2 (two) times daily. 30 g 0   No current facility-administered medications for this visit.     Review of Systems Review of Systems  Constitutional: Negative.   Gastrointestinal: Negative.   Genitourinary: Negative for pelvic pain, vaginal bleeding and vaginal discharge.    Last menstrual period 09/24/2017.  Physical Exam Physical Exam  Constitutional: She appears well-developed.  Cardiovascular: Normal rate.  Pulmonary/Chest: Effort normal.  Skin: Skin is warm and dry.  Psychiatric: She has a normal mood and affect. Her behavior is normal.  Vitals reviewed.   Data Reviewed  CLINICAL DATA:  History of nonviable pregnancy. Follow-up evaluation.  EXAM: TRANSVAGINAL OB ULTRASOUND  TECHNIQUE: Transvaginal ultrasound was performed for complete evaluation of the gestation as well  as the maternal uterus, adnexal regions, and pelvic cul-de-sac.  COMPARISON:  Pelvic ultrasound 12/15/2017  FINDINGS: Intrauterine gestational sac: Single  Yolk sac:  Not Visualized.  Embryo:  Not Visualized.  Cardiac Activity: Not Visualized.  Subchorionic hemorrhage:  Moderate  Maternal uterus/adnexae: The right and left ovaries are normal in appearance. No free fluid in the pelvis.  IMPRESSION: 1. There is continued presence of an intrauterine gestational sac however the embryo and yolk sac are no longer visualized. 2. Moderate subchorionic  hemorrhage.   Electronically Signed   By: Annia Belt M.D.   On: 12/22/2017 11:08 Assessment    Missed abortion, incomplete after cytotec    Plan    Will schedule D&C as outpatient. Patient desires surgical management.  The risks of surgery were discussed in detail with the patient including but not limited to: bleeding which may require transfusion or reoperation; infection which may require prolonged hospitalization or re-hospitalization and antibiotic therapy; injury to bowel, bladder, ureters and major vessels or other surrounding organs; need for additional procedures including laparotomy; thromboembolic phenomenon, incisional problems and other postoperative or anesthesia complications.  Patient was told that the likelihood that her condition and symptoms will be treated effectively with this surgical management was very high; the postoperative expectations were also discussed in detail. The patient also understands the alternative treatment options which were discussed in full. All questions were answered.  She was told that she will be contacted by our surgical scheduler regarding the time and date of her surgery; routine preoperative instructions of having nothing to eat or drink after midnight on the day prior to surgery and also coming to the hospital 1.5 hours prior to her time of surgery were also emphasized.  She was told she may be called for a preoperative appointment about a week prior to surgery and will be given further preoperative instructions at that visit. Printed patient education handouts about the procedure were given to the patient to review at home.         Scheryl Darter 12/22/2017, 12:03 PM

## 2017-12-22 NOTE — Patient Instructions (Signed)
Dilation and Curettage or Vacuum Curettage Dilation and curettage (D&C) and vacuum curettage are minor procedures. A D&C involves stretching (dilation) the cervix and scraping (curettage) the inside lining of the uterus (endometrium). During a D&C, tissue is gently scraped from the endometrium, starting from the top portion of the uterus down to the lowest part of the uterus (cervix). During a vacuum curettage, the lining and tissue in the uterus are removed with the use of gentle suction. Curettage may be performed to either diagnose or treat a problem. As a diagnostic procedure, curettage is performed to examine tissues from the uterus. A diagnostic curettage may be done if you have:  Irregular bleeding in the uterus.  Bleeding with the development of clots.  Spotting between menstrual periods.  Prolonged menstrual periods or other abnormal bleeding.  Bleeding after menopause.  No menstrual period (amenorrhea).  A change in size and shape of the uterus.  Abnormal endometrial cells discovered during a Pap test.  As a treatment procedure, curettage may be performed for the following reasons:  Removal of an IUD (intrauterine device).  Removal of retained placenta after giving birth.  Abortion.  Miscarriage.  Removal of endometrial polyps.  Removal of uncommon types of noncancerous lumps (fibroids).  Tell a health care provider about:  Any allergies you have, including allergies to prescribed medicine or latex.  All medicines you are taking, including vitamins, herbs, eye drops, creams, and over-the-counter medicines. This is especially important if you take any blood-thinning medicine. Bring a list of all of your medicines to your appointment.  Any problems you or family members have had with anesthetic medicines.  Any blood disorders you have.  Any surgeries you have had.  Your medical history and any medical conditions you have.  Whether you are pregnant or may be  pregnant.  Recent vaginal infections you have had.  Recent menstrual periods, bleeding problems you have had, and what form of birth control (contraception) you use. What are the risks? Generally, this is a safe procedure. However, problems may occur, including:  Infection.  Heavy vaginal bleeding.  Allergic reactions to medicines.  Damage to the cervix or other structures or organs.  Development of scar tissue (adhesions) inside the uterus, which can cause abnormal amounts of menstrual bleeding. This may make it harder to get pregnant in the future.  A hole (perforation) or puncture in the uterine wall. This is rare.  What happens before the procedure? Staying hydrated Follow instructions from your health care provider about hydration, which may include:  Up to 2 hours before the procedure - you may continue to drink clear liquids, such as water, clear fruit juice, black coffee, and plain tea.  Eating and drinking restrictions Follow instructions from your health care provider about eating and drinking, which may include:  8 hours before the procedure - stop eating heavy meals or foods such as meat, fried foods, or fatty foods.  6 hours before the procedure - stop eating light meals or foods, such as toast or cereal.  6 hours before the procedure - stop drinking milk or drinks that contain milk.  2 hours before the procedure - stop drinking clear liquids. If your health care provider told you to take your medicine(s) on the day of your procedure, take them with only a sip of water.  Medicines  Ask your health care provider about: ? Changing or stopping your regular medicines. This is especially important if you are taking diabetes medicines or blood thinners. ? Taking   medicines such as aspirin and ibuprofen. These medicines can thin your blood. Do not take these medicines before your procedure if your health care provider instructs you not to.  You may be given antibiotic  medicine to help prevent infection. General instructions  For 24 hours before your procedure, do not: ? Douche. ? Use tampons. ? Use medicines, creams, or suppositories in the vagina. ? Have sexual intercourse.  You may be given a pregnancy test on the day of the procedure.  Plan to have someone take you home from the hospital or clinic.  You may have a blood or urine sample taken.  If you will be going home right after the procedure, plan to have someone with you for 24 hours. What happens during the procedure?  To reduce your risk of infection: ? Your health care team will wash or sanitize their hands. ? Your skin will be washed with soap.  An IV tube will be inserted into one of your veins.  You will be given one of the following: ? A medicine that numbs the area in and around the cervix (local anesthetic). ? A medicine to make you fall asleep (general anesthetic).  You will lie down on your back, with your feet in foot rests (stirrups).  The size and position of your uterus will be checked.  A lubricated instrument (speculum or Sims retractor) will be inserted into the back side of your vagina. The speculum will be used to hold apart the walls of your vagina so your health care provider can see your cervix.  A tool (tenaculum) will be attached to the lip of the cervix to stabilize it.  Your cervix will be softened and dilated. This may be done by: ? Taking a medicine. ? Having tapered dilators or thin rods (laminaria) or gradual widening instruments (tapered dilators) inserted into your cervix.  A small, sharp, curved instrument (curette) will be used to scrape a small amount of tissue or cells from the endometrium or cervical canal. In some cases, gentle suction is applied with the curette. The curette will then be removed. The cells will be taken to a lab for testing. The procedure may vary among health care providers and hospitals. What happens after the  procedure?  You may have mild cramping, backache, pain, and light bleeding or spotting. You may pass small blood clots from your vagina.  You may have to wear compression stockings. These stockings help to prevent blood clots and reduce swelling in your legs.  Your blood pressure, heart rate, breathing rate, and blood oxygen level will be monitored until the medicines you were given have worn off. Summary  Dilation and curettage (D&C) involves stretching (dilation) the cervix and scraping (curettage) the inside lining of the uterus (endometrium).  After the procedure, you may have mild cramping, backache, pain, and light bleeding or spotting. You may pass small blood clots from your vagina.  Plan to have someone take you home from the hospital or clinic. This information is not intended to replace advice given to you by your health care provider. Make sure you discuss any questions you have with your health care provider. Document Released: 10/11/2005 Document Revised: 06/27/2016 Document Reviewed: 06/27/2016 Elsevier Interactive Patient Education  2018 [employer] Inc[employer]  

## 2017-12-23 ENCOUNTER — Telehealth: Payer: Self-pay | Admitting: General Practice

## 2017-12-23 ENCOUNTER — Encounter (HOSPITAL_COMMUNITY): Payer: Self-pay | Admitting: Anesthesiology

## 2017-12-23 ENCOUNTER — Ambulatory Visit (HOSPITAL_COMMUNITY)
Admission: RE | Admit: 2017-12-23 | Discharge: 2017-12-23 | Disposition: A | Discharge status: Home / Self Care | Payer: Medicaid Other | Source: Ambulatory Visit | Attending: Obstetrics & Gynecology | Admitting: Obstetrics & Gynecology

## 2017-12-23 ENCOUNTER — Ambulatory Visit (HOSPITAL_COMMUNITY): Payer: Medicaid Other | Admitting: Anesthesiology

## 2017-12-23 ENCOUNTER — Encounter (HOSPITAL_COMMUNITY)
Admission: RE | Disposition: A | Discharge status: Home / Self Care | Payer: Self-pay | Source: Ambulatory Visit | Attending: Obstetrics & Gynecology

## 2017-12-23 DIAGNOSIS — I10 Essential (primary) hypertension: Secondary | ICD-10-CM | POA: Insufficient documentation

## 2017-12-23 DIAGNOSIS — O034 Incomplete spontaneous abortion without complication: Secondary | ICD-10-CM

## 2017-12-23 DIAGNOSIS — Z79899 Other long term (current) drug therapy: Secondary | ICD-10-CM | POA: Diagnosis not present

## 2017-12-23 DIAGNOSIS — Z6836 Body mass index (BMI) 36.0-36.9, adult: Secondary | ICD-10-CM | POA: Insufficient documentation

## 2017-12-23 DIAGNOSIS — Z880 Allergy status to penicillin: Secondary | ICD-10-CM | POA: Insufficient documentation

## 2017-12-23 DIAGNOSIS — Z9884 Bariatric surgery status: Secondary | ICD-10-CM | POA: Diagnosis not present

## 2017-12-23 DIAGNOSIS — E282 Polycystic ovarian syndrome: Secondary | ICD-10-CM | POA: Diagnosis not present

## 2017-12-23 HISTORY — PX: DILATION AND EVACUATION: SHX1459

## 2017-12-23 LAB — TYPE AND SCREEN
ABO/RH(D): AB POS
Antibody Screen: NEGATIVE

## 2017-12-23 SURGERY — DILATION AND EVACUATION, UTERUS
Anesthesia: General

## 2017-12-23 MED ORDER — FENTANYL CITRATE (PF) 250 MCG/5ML IJ SOLN
INTRAMUSCULAR | Status: DC | PRN
  Administered 2017-12-23: 50 ug via INTRAVENOUS
  Administered 2017-12-23: 100 ug via INTRAVENOUS
  Administered 2017-12-23 (×2): 25 ug via INTRAVENOUS

## 2017-12-23 MED ORDER — LACTATED RINGERS IV SOLN
INTRAVENOUS | Status: DC
  Administered 2017-12-23: 125 mL/h via INTRAVENOUS
  Administered 2017-12-23: 12:00:00 via INTRAVENOUS

## 2017-12-23 MED ORDER — ONDANSETRON HCL 4 MG/2ML IJ SOLN
INTRAMUSCULAR | Status: AC
  Filled 2017-12-23: qty 2

## 2017-12-23 MED ORDER — PROPOFOL 10 MG/ML IV BOLUS
INTRAVENOUS | Status: DC | PRN
  Administered 2017-12-23: 200 mg via INTRAVENOUS

## 2017-12-23 MED ORDER — GLYCOPYRROLATE 0.2 MG/ML IJ SOLN
INTRAMUSCULAR | Status: DC | PRN
  Administered 2017-12-23: 0.1 mg via INTRAVENOUS

## 2017-12-23 MED ORDER — BUPIVACAINE HCL (PF) 0.5 % IJ SOLN
INTRAMUSCULAR | Status: AC
  Filled 2017-12-23: qty 30

## 2017-12-23 MED ORDER — LIDOCAINE HCL (CARDIAC) 20 MG/ML IV SOLN
INTRAVENOUS | Status: AC
  Filled 2017-12-23: qty 5

## 2017-12-23 MED ORDER — DOXYCYCLINE HYCLATE 100 MG PO CAPS: 100.0000 mg | ORAL_CAPSULE | Freq: Two times a day (BID) | ORAL | 0 refills | Status: DC

## 2017-12-23 MED ORDER — MIDAZOLAM HCL 2 MG/2ML IJ SOLN
INTRAMUSCULAR | Status: AC
  Filled 2017-12-23: qty 2

## 2017-12-23 MED ORDER — FENTANYL CITRATE (PF) 100 MCG/2ML IJ SOLN
INTRAMUSCULAR | Status: AC
  Filled 2017-12-23: qty 4

## 2017-12-23 MED ORDER — GLYCOPYRROLATE 0.2 MG/ML IJ SOLN
INTRAMUSCULAR | Status: AC
  Filled 2017-12-23: qty 1

## 2017-12-23 MED ORDER — DEXAMETHASONE SODIUM PHOSPHATE 4 MG/ML IJ SOLN
INTRAMUSCULAR | Status: DC | PRN
  Administered 2017-12-23: 10 mg via INTRAVENOUS

## 2017-12-23 MED ORDER — FENTANYL CITRATE (PF) 100 MCG/2ML IJ SOLN: 25.0000 ug | INTRAMUSCULAR | Status: DC | PRN

## 2017-12-23 MED ORDER — LACTATED RINGERS IV SOLN: INTRAVENOUS | Status: DC

## 2017-12-23 MED ORDER — SCOPOLAMINE 1 MG/3DAYS TD PT72
1.0000 | MEDICATED_PATCH | Freq: Once | TRANSDERMAL | Status: DC
  Administered 2017-12-23: 1.5 mg via TRANSDERMAL

## 2017-12-23 MED ORDER — DEXTROSE 5 % IV SOLN
200.0000 mg | INTRAVENOUS | Status: AC
  Administered 2017-12-23: 200 mg via INTRAVENOUS
  Filled 2017-12-23: qty 200

## 2017-12-23 MED ORDER — MIDAZOLAM HCL 2 MG/2ML IJ SOLN
INTRAMUSCULAR | Status: DC | PRN
  Administered 2017-12-23: 1 mg via INTRAVENOUS

## 2017-12-23 MED ORDER — PROMETHAZINE HCL 25 MG/ML IJ SOLN: 6.2500 mg | INTRAMUSCULAR | Status: DC | PRN

## 2017-12-23 MED ORDER — SCOPOLAMINE 1 MG/3DAYS TD PT72
MEDICATED_PATCH | TRANSDERMAL | Status: AC
  Administered 2017-12-23: 1.5 mg via TRANSDERMAL
  Filled 2017-12-23: qty 1

## 2017-12-23 MED ORDER — PROPOFOL 10 MG/ML IV BOLUS
INTRAVENOUS | Status: AC
  Filled 2017-12-23: qty 20

## 2017-12-23 MED ORDER — ONDANSETRON HCL 4 MG/2ML IJ SOLN
INTRAMUSCULAR | Status: DC | PRN
  Administered 2017-12-23: 4 mg via INTRAVENOUS

## 2017-12-23 MED ORDER — LIDOCAINE HCL (CARDIAC) 20 MG/ML IV SOLN
INTRAVENOUS | Status: DC | PRN
  Administered 2017-12-23: 100 mg via INTRAVENOUS

## 2017-12-23 MED ORDER — KETOROLAC TROMETHAMINE 30 MG/ML IJ SOLN
INTRAMUSCULAR | Status: DC | PRN
  Administered 2017-12-23: 30 mg via INTRAVENOUS

## 2017-12-23 MED ORDER — IBUPROFEN 600 MG PO TABS: 600.0000 mg | ORAL_TABLET | Freq: Four times a day (QID) | ORAL | 0 refills | Status: DC | PRN

## 2017-12-23 MED ORDER — DEXAMETHASONE SODIUM PHOSPHATE 10 MG/ML IJ SOLN
INTRAMUSCULAR | Status: AC
  Filled 2017-12-23: qty 1

## 2017-12-23 MED ORDER — BUPIVACAINE HCL (PF) 0.5 % IJ SOLN
INTRAMUSCULAR | Status: DC | PRN
  Administered 2017-12-23: 20 mL

## 2017-12-23 SURGICAL SUPPLY — 19 items
CATH ROBINSON RED A/P 16FR (CATHETERS) ×3 IMPLANT
DECANTER SPIKE VIAL GLASS SM (MISCELLANEOUS) ×3 IMPLANT
GLOVE BIOGEL PI IND STRL 7.0 (GLOVE) ×2 IMPLANT
GLOVE BIOGEL PI INDICATOR 7.0 (GLOVE) ×4
GLOVE ECLIPSE 7.0 STRL STRAW (GLOVE) ×3 IMPLANT
GOWN STRL REUS W/ TWL XL LVL3 (GOWN DISPOSABLE) ×1 IMPLANT
GOWN STRL REUS W/TWL LRG LVL3 (GOWN DISPOSABLE) ×3 IMPLANT
GOWN STRL REUS W/TWL XL LVL3 (GOWN DISPOSABLE) ×2
KIT BERKELEY 1ST TRIMESTER 3/8 (MISCELLANEOUS) ×3 IMPLANT
NS IRRIG 1000ML POUR BTL (IV SOLUTION) ×3 IMPLANT
PACK VAGINAL MINOR WOMEN LF (CUSTOM PROCEDURE TRAY) ×3 IMPLANT
PAD OB MATERNITY 4.3X12.25 (PERSONAL CARE ITEMS) ×3 IMPLANT
PAD PREP 24X48 CUFFED NSTRL (MISCELLANEOUS) ×3 IMPLANT
SET BERKELEY SUCTION TUBING (SUCTIONS) ×3 IMPLANT
TOWEL OR 17X24 6PK STRL BLUE (TOWEL DISPOSABLE) ×6 IMPLANT
VACURETTE 10 RIGID CVD (CANNULA) IMPLANT
VACURETTE 7MM CVD STRL WRAP (CANNULA) IMPLANT
VACURETTE 8 RIGID CVD (CANNULA) IMPLANT
VACURETTE 9 RIGID CVD (CANNULA) IMPLANT

## 2017-12-23 NOTE — Anesthesia Procedure Notes (Signed)
Procedure Name: LMA Insertion Date/Time: 12/23/2017 1:53 PM Performed by: Graciela HusbandsFussell, Blaise Grieshaber O, CRNA Pre-anesthesia Checklist: Patient identified, Patient being monitored, Emergency Drugs available, Timeout performed and Suction available Patient Re-evaluated:Patient Re-evaluated prior to induction Oxygen Delivery Method: Circle System Utilized Preoxygenation: Pre-oxygenation with 100% oxygen Induction Type: IV induction Ventilation: Mask ventilation without difficulty LMA: LMA inserted LMA Size: 4.0 Number of attempts: 1 Placement Confirmation: positive ETCO2 and breath sounds checked- equal and bilateral Tube secured with: Tape Dental Injury: Teeth and Oropharynx as per pre-operative assessment

## 2017-12-23 NOTE — Telephone Encounter (Signed)
Left message on VM in regards to appt.  Asked patient to give our office a call.

## 2017-12-23 NOTE — Discharge Instructions (Signed)
[facility] [address] [place], [place]  [id number] Phone:  [phone]   December 23, 2017  Patient: Vanessa Braun  Date of Birth: [birth year]  Date of Visit: 12/23/2017    To Whom It May Concern:  Armando ReichertBeverly Braun was seen and treated  on 12/23/2017.           If you have any questions or concerns, please don't hesitate to call.   Sincerely,       Treatment Team:  Attending Provider: Willodean RosenthalHarraway-Smith, Carolyn, MD                     Post Anesthesia Home Care Instructions  Activity: Get plenty of rest for the remainder of the day. A responsible individual must stay with you for 24 hours following the procedure.  For the next 24 hours, DO NOT: -Drive a car -Advertising copywriterperate machinery -Drink alcoholic beverages -Take any medication unless instructed by your physician -Make any legal decisions or sign important papers.  Meals: Start with liquid foods such as gelatin or soup. Progress to regular foods as tolerated. Avoid greasy, spicy, heavy foods. If nausea and/or vomiting occur, drink only clear liquids until the nausea and/or vomiting subsides. Call your physician if vomiting continues.  Special Instructions/Symptoms: Your throat may feel dry or sore from the anesthesia or the breathing tube placed in your throat during surgery. If this causes discomfort, gargle with warm salt water. The discomfort should disappear within 24 hours.  If you had a scopolamine patch placed behind your ear for the management of post- operative nausea and/or vomiting:  1. The medication in the patch is effective for 72 hours, after which it should be removed.  Wrap patch in a tissue and discard in the trash. Wash hands thoroughly with soap and water. 2. You may remove the patch earlier than 72 hours if you experience unpleasant side effects which may include dry mouth, dizziness or visual disturbances. 3. Avoid touching the patch. Wash your hands with soap  and water after contact with the patch.    Incomplete Miscarriage A miscarriage is the sudden loss of an unborn baby (fetus) before the 20th week of pregnancy. In an incomplete miscarriage, parts of the fetus or placenta (afterbirth) remain in the body. Having a miscarriage can be an emotional experience. Talk with your health care provider about any questions you may have about miscarrying, the grieving process, and your future pregnancy plans. What are the causes?  Problems with the fetal chromosomes that make it impossible for the baby to develop normally. Problems with the baby's genes or chromosomes are most often the result of errors that occur by chance as the embryo divides and grows. The problems are not inherited from the parents.  Infection of the cervix or uterus.  Hormone problems.  Problems with the cervix, such as having an incompetent cervix. This is when the tissue in the cervix is not strong enough to hold the pregnancy.  Problems with the uterus, such as an abnormally shaped uterus, uterine fibroids, or congenital abnormalities.  Certain medical conditions.  Smoking, drinking alcohol, or taking illegal drugs.  Trauma. What are the signs or symptoms?  Vaginal bleeding or spotting, with or without cramps or pain.  Pain or cramping in the abdomen or lower back.  Passing fluid, tissue, or blood clots from the vagina. How is this diagnosed? Your health care provider will perform a physical exam. You may also have an ultrasound to confirm the  miscarriage. Blood or urine tests may also be ordered. How is this treated?  Usually, a dilation and curettage (D&C) procedure is performed. During a D&C procedure, the cervix is widened (dilated) and any remaining fetal or placental tissue is gently removed from the uterus.  Antibiotic medicines are prescribed if there is an infection. Other medicines may be given to reduce the size of the uterus (contract) if there is a lot of  bleeding.  If you have Rh negative blood and your baby was Rh positive, you will need a Rho (D) immune globulin shot. This shot will protect any future baby from having Rh blood problems in future pregnancies.  You may be confined to bed rest. This means you should stay in bed and only get up to use the bathroom. Follow these instructions at home:  Rest as directed by your health care provider.  Restrict activity as directed by your health care provider. You may be allowed to continue light activity if curettage was not done but you require further treatment.  Keep track of the number of pads you use each day. Keep track of how soaked (saturated) they are. Record this information.  Do not  use tampons.  Do not douche or have sexual intercourse until approved by your health care provider.  Keep all follow-up appointments for reevaluation and continuing management.  Only take over-the-counter or prescription medicines for pain, fever, or discomfort as directed by your health care provider.  Take antibiotic medicine as directed by your health care provider. Make sure you finish it even if you start to feel better. Get help right away if:  You experience severe cramps in your stomach, back, or abdomen.  You have an unexplained temperature (make sure to record these temperatures).  You pass large clots or tissue (save these for your health care provider to inspect).  Your bleeding increases.  You become light-headed, weak, or have fainting episodes. This information is not intended to replace advice given to you by your health care provider. Make sure you discuss any questions you have with your health care provider. Document Released: 10/11/2005 Document Revised: 03/18/2016 Document Reviewed: 05/10/2013 Elsevier Interactive Patient Education  2017 [employer] Dilation and Curettage or Vacuum Curettage, Care After These instructions give you information about caring for yourself  after your procedure. Your doctor may also give you more specific instructions. Call your doctor if you have any problems or questions after your procedure. Follow these instructions at home: Activity  Do not drive or use heavy machinery while taking prescription pain medicine.  For 24 hours after your procedure, avoid driving.  Take short walks often, followed by rest periods. Ask your doctor what activities are safe for you. After one or two days, you may be able to return to your normal activities.  Do not lift anything that is heavier than 10 lb (4.5 kg) until your doctor approves.  For at least 2 weeks, or as long as told by your doctor: ? Do not douche. ? Do not use tampons. ? Do not have sex. General instructions  Take over-the-counter and prescription medicines only as told by your doctor. This is very important if you take blood thinning medicine.  Do not take baths, swim, or use a hot tub until your doctor approves. Take showers instead of baths.  Wear compression stockings as told by your doctor.  It is up to you to get the results of your procedure. Ask your doctor when your results will be  ready.  Keep all follow-up visits as told by your doctor. This is important. Contact a doctor if:  You have very bad cramps that get worse or do not get better with medicine.  You have very bad pain in your belly (abdomen).  You cannot drink fluids without throwing up (vomiting).  You get pain in a different part of the area between your belly and thighs (pelvis).  You have bad-smelling discharge from your vagina.  You have a rash. Get help right away if:  You are bleeding a lot from your vagina. A lot of bleeding means soaking more than one sanitary pad in an hour, for 2 hours in a row.  You have clumps of blood (blood clots) coming from your vagina.  You have a fever or chills.  Your belly feels very tender or hard.  You have chest pain.  You have trouble  breathing.  You cough up blood.  You feel dizzy.  You feel light-headed.  You pass out (faint).  You have pain in your neck or shoulder area. Summary  Take short walks often, followed by rest periods. Ask your doctor what activities are safe for you. After one or two days, you may be able to return to your normal activities.  Do not lift anything that is heavier than 10 lb (4.5 kg) until your doctor approves.  Do not take baths, swim, or use a hot tub until your doctor approves. Take showers instead of baths.  Contact your doctor if you have any symptoms of infection, like bad-smelling discharge from your vagina. This information is not intended to replace advice given to you by your health care provider. Make sure you discuss any questions you have with your health care provider. Document Released: 07/20/2008 Document Revised: 06/28/2016 Document Reviewed: 06/28/2016 Elsevier Interactive Patient Education  2017 [employer].

## 2017-12-23 NOTE — Transfer of Care (Signed)
Immediate Anesthesia Transfer of Care Note  Patient: Vanessa Braun  Procedure(s) Performed: DILATATION AND EVACUATION (N/A )  Patient Location: PACU  Anesthesia Type:General  Level of Consciousness: awake, alert  and oriented  Airway & Oxygen Therapy: Patient Spontanous Breathing and Patient connected to nasal cannula oxygen  Post-op Assessment: Report given to RN and Post -op Vital signs reviewed and stable  Post vital signs: Reviewed and stable  Last Vitals:  Vitals:   12/23/17 1209  BP: (!) 133/102  Pulse: 63  Resp: 16  Temp: 36.6 C  SpO2: 100%    Last Pain:  Vitals:   12/23/17 1209  TempSrc: Oral      Patients Stated Pain Goal: 5 (12/23/17 1209)  Complications: No apparent anesthesia complications

## 2017-12-23 NOTE — Op Note (Signed)
12/23/2017  2:25 PM  PATIENT:  Vanessa Braun  37 y.o. female  PRE-OPERATIVE DIAGNOSIS:  Incomplete AB  POST-OPERATIVE DIAGNOSIS:  Incomplete Abortion  PROCEDURE:  Procedure(s): DILATATION AND EVACUATION (N/A)  SURGEON:  Surgeon(s) and Role:    * Willodean RosenthalHarraway-Smith, Treyshon Buchanon, MD - Primary  ANESTHESIA:   general and paracervical block  EBL:  10 mL   BLOOD ADMINISTERED:none  DRAINS: none   LOCAL MEDICATIONS USED:  MARCAINE     SPECIMEN:  Source of Specimen:  products of conception  DISPOSITION OF SPECIMEN:  PATHOLOGY  COUNTS:  YES  TOURNIQUET:  * No tourniquets in log *  DICTATION: .Note written in EPIC  PLAN OF CARE: Discharge to home after PACU  PATIENT DISPOSITION:  PACU - hemodynamically stable.   Delay start of Pharmacological VTE agent (>24hrs) due to surgical blood loss or risk of bleeding: not applicable  Complications: none immediate   INDICATIONS: 37 y.o. G3P0020 with incomplete AB at 12 5/[redacted] week gestation, needing surgical completion.  Risks of surgery were discussed with the patient including but not limited to: bleeding which may require transfusion; infection which may require antibiotics; injury to uterus or surrounding organs; need for additional procedures including laparotomy or laparoscopy; possibility of intrauterine scarring which may impair future fertility; and other postoperative/anesthesia complications. Written informed consent was obtained.     PROCEDURE DETAILS:  The patient received intravenous Doxycycline while in the preoperative area.  She was then taken to the operating room where monitored intravenous sedation was administered and was found to be adequate.  After an adequate timeout was performed, she was placed in the dorsal lithotomy position and examined; then prepped and draped in the sterile manner.   Her bladder was catheterized for an unmeasured amount of clear, yellow urine. A vaginal speculum was then placed in the patient's  vagina and a single tooth tenaculum was applied to the anterior lip of the cervix.  A paracervical block using 20 ml of 0.5% Marcaine was administered. The cervix was gently dilated to accommodate a 7 mm suction curette that was gently advanced to the uterine fundus.  The suction device was then activated and curette slowly rotated to clear the uterus of products of conception.  A sharp curettage was then performed to confirm complete emptying of the uterus. There was minimal bleeding noted and the tenaculum removed with good hemostasis noted.   All instruments were removed from the patient's vagina. The patient tolerated the procedure well and was taken to the recovery area awake, and in stable condition.  The patient will be discharged to home as per PACU criteria.  Routine postoperative instructions given.  She was prescribed , Ibuprofen and Doxycycline.  She will follow up in the clinic in 2 weeks for postoperative evaluation.  Yiannis Tulloch L. Harraway-Smith, M.D., [facility]

## 2017-12-23 NOTE — H&P (Signed)
Preoperative History and Physical  Vanessa Braun is a 37 y.o. G3P0020 here for surgical management of incomplete abortion    Proposed surgery: dilation and evacuation  Past Medical History:  Diagnosis Date  . Abnormal Pap smear   . Chronic dermatitis of hands   . Eczema   . GERD (gastroesophageal reflux disease)    lost weight with bypass surgery, no problems since  . PCOS (polycystic ovarian syndrome)    Past Surgical History:  Procedure Laterality Date  . ADENOIDECTOMY    . DILATION AND CURETTAGE OF UTERUS    . DILATION AND EVACUATION N/A 04/01/2014   Procedure: DILATATION AND EVACUATION;  Surgeon: Robley Fries, MD;  Location: [facility];  Service: Gynecology;  Laterality: N/A;  . GASTRIC BYPASS    . TONSILLECTOMY AND ADENOIDECTOMY  1999   OB History    Gravida Para Term Preterm AB Living   3 0 0 0 2 0   SAB TAB Ectopic Multiple Live Births   1 0 0 0       Patient denies any cervical dysplasia or STIs. Medications Prior to Admission  Medication Sig Dispense Refill Last Dose  . CALCITRIOL PO Take by mouth.   Past Month at Unknown time  . Cyanocobalamin (VITAMIN B-12 PO) Take by mouth.   Past Month at Unknown time  . DIPHENHYDRAMINE HCL PO Take by mouth.   Past Month at Unknown time  . misoprostol (CYTOTEC) 200 MCG tablet Take 1 tablet (200 mcg total) by mouth 4 (four) times daily. Let 4 tablets dissolve in mouth once. Place between cheek and gum for 30 min. 4 tablet 0 Past Month at Unknown time  . ondansetron (ZOFRAN) 4 MG tablet Take 1 tablet (4 mg total) by mouth every 8 (eight) hours as needed for nausea or vomiting. 20 tablet 0   . oxyCODONE-acetaminophen (PERCOCET/ROXICET) 5-325 MG tablet Take 1 tablet by mouth every 4 (four) hours as needed for severe pain. 4 tablet 0 Past Month at Unknown time  . fluocinonide-emollient (LIDEX-E) 0.05 % cream Apply 1 application topically 2 (two) times daily. 60 g 0 More than a month at Unknown time  . HYDROXYZINE HCL PO Take by  mouth.   Unknown at Unknown time  . predniSONE (DELTASONE) 20 MG tablet Two daily with food 10 tablet 0 More than a month at Unknown time  . triamcinolone cream (KENALOG) 0.1 % Apply 1 application topically 2 (two) times daily. 30 g 0 More than a month at Unknown time    Allergies  Allergen Reactions  . Other     "anything antibacterial"; some creams for dermatitis  . Penicillins Itching and Rash   Social History:   reports that  has never smoked. she has never used smokeless tobacco. She reports that she drinks about 6.0 oz of alcohol per week. She reports that she does not use drugs. Family History  Problem Relation Age of Onset  . Diabetes Mother   . Hyperlipidemia Mother   . Hypertension Mother   . Diabetes Father   . Kidney disease Father   . Hypertension Father   . Gestational diabetes Sister   . Depression Sister     Review of Systems: Noncontributory  PHYSICAL EXAM: Height 5\' 9"  (1.753 m), weight 246 lb (111.6 kg), last menstrual period 09/24/2017. General appearance - alert, well appearing, and in no distress Chest - clear to auscultation, no wheezes, rales or rhonchi, symmetric air entry Heart - normal rate and regular rhythm Abdomen -  soft, nontender, nondistended, no masses or organomegaly Pelvic - examination not indicated Extremities - peripheral pulses normal, no pedal edema, no clubbing or cyanosis  Labs: Results for orders placed or performed in visit on 12/15/17 (from the past 336 hour(s))  CBC   Collection Time: 12/15/17  3:08 PM  Result Value Ref Range   WBC 6.6 3.4 - 10.8 x10E3/uL   RBC 3.84 3.77 - 5.28 x10E6/uL   Hemoglobin 11.7 11.1 - 15.9 g/dL   Hematocrit 16.1 09.6 - 46.6 %   MCV 94 79 - 97 fL   MCH 30.5 26.6 - 33.0 pg   MCHC 32.6 31.5 - 35.7 g/dL   RDW 04.5 40.9 - 81.1 %   Platelets 197 150 - 379 x10E3/uL    Imaging Studies: US Ob Comp Less 14 Wks  Result Date: 11/27/2017 CLINICAL DATA:  Pregnant patient with bleeding/spotting. EXAM:  OBSTETRIC <14 WK [place] AND TRANSVAGINAL OB US TECHNIQUE: Both transabdominal and transvaginal ultrasound examinations were performed for complete evaluation of the gestation as well as the maternal uterus, adnexal regions, and pelvic cul-de-sac. Transvaginal technique was performed to assess early pregnancy. COMPARISON:  None. FINDINGS: Intrauterine gestational sac: Single Yolk sac:  Visualized. Embryo:  Visualized. Cardiac Activity: Not Visualized. Heart Rate:   bpm MSD:   mm    w     d CRL:  6.2 mm   6 w   3 d                  [place] [facility]: July 20, 2018 Subchorionic hemorrhage: There is a rounded region of mild decreased subchorionic echogenicity which results in a convexity of the gestational sac. Maternal uterus/adnexae: Normal.  Corpus luteum cyst on the right. IMPRESSION: 1. An intrauterine pregnancy is identified. A yolk sac and fetus are seen. The crown-rump length is 6.2 mm. No cardiac activity is identified within the fetus. These findings are suspicious but not diagnostic of non viability. Recommend an ultrasound in 7-10 days for further characterization. 2. Rounded region of decreased echogenicity in the subchorionic region could represent an adjacent fibroid, an unusual subchorionic hemorrhage, or a contraction. Recommend attention on follow-up. Electronically Signed   By: Gerome Sam III M.D   On: 11/27/2017 20:01   US Ob Transvaginal  Result Date: 12/22/2017 CLINICAL DATA:  History of nonviable pregnancy. Follow-up evaluation. EXAM: TRANSVAGINAL OB ULTRASOUND TECHNIQUE: Transvaginal ultrasound was performed for complete evaluation of the gestation as well as the maternal uterus, adnexal regions, and pelvic cul-de-sac. COMPARISON:  Pelvic ultrasound 12/15/2017 FINDINGS: Intrauterine gestational sac: Single Yolk sac:  Not Visualized. Embryo:  Not Visualized. Cardiac Activity: Not Visualized. Subchorionic hemorrhage:  Moderate Maternal uterus/adnexae: The right and left ovaries are normal in  appearance. No free fluid in the pelvis. IMPRESSION: 1. There is continued presence of an intrauterine gestational sac however the embryo and yolk sac are no longer visualized. 2. Moderate subchorionic hemorrhage. Electronically Signed   By: Annia Belt M.D.   On: 12/22/2017 11:08   US Ob Transvaginal  Result Date: 12/15/2017 CLINICAL DATA:  Known missed abortion.  Status post Cytotec. EXAM: TRANSVAGINAL OB ULTRASOUND TECHNIQUE: Transvaginal ultrasound was performed for complete evaluation of the gestation as well as the maternal uterus, adnexal regions, and pelvic cul-de-sac. COMPARISON:  12/09/2017 FINDINGS: Intrauterine gestational sac: Single Yolk sac:  Visualized. Embryo:  Visualized. Cardiac Activity: Not Visualized. Heart Rate: Absent bpm CRL:   3.4 mm mm   5 w 6 d Subchorionic hemorrhage:  Moderate subchorionic hemorrhage. Maternal uterus/adnexae:  Right ovary: Normal Left ovary: Normal Other :None Free fluid:  None IMPRESSION: 1. Continued presence of intrauterine gestational sac containing an embryo and yolk sac. No cardiac activity identified within the embryo. 2. Moderate subchorionic hemorrhage. Electronically Signed   By: Signa Kellaylor  Stroud M.D.   On: 12/15/2017 13:46   [place] Ob Transvaginal  Result Date: 12/09/2017 CLINICAL DATA:  Assess viability. EXAM: TRANSVAGINAL OB ULTRASOUND TECHNIQUE: Transvaginal ultrasound was performed for complete evaluation of the gestation as well as the maternal uterus, adnexal regions, and pelvic cul-de-sac. COMPARISON:  12/02/2017 FINDINGS: Intrauterine gestational sac: Single Yolk sac:  Not Visualized. Embryo:  Visualized. Cardiac Activity: Not Visualized. CRL:   3.3 mm (previously 5 mm) 5 w 6 d Maternal uterus/adnexae: Subchorionic hemorrhage: Small Right ovary: Normal Left ovary: Normal Other :None Free fluid:  None IMPRESSION: 1. Again noted is a single intrauterine gestational sac which now contains an embryo without cardiac activity. The yolk sac is no longer  visualized and the fetal pole is decreased in size from previous exam. Findings meet definitive criteria for failed pregnancy. This follows SRU consensus guidelines: Diagnostic Criteria for Nonviable Pregnancy Early in the First Trimester. Macy Mis Engl J Med [phone];369:1443-51. Electronically Signed   By: Signa Kellaylor  Stroud M.D.   On: 12/09/2017 13:15   [place] Ob Transvaginal  Result Date: 12/02/2017 CLINICAL DATA:  Vaginal bleeding. First trimester pregnancy with inconclusive fetal viability. EXAM: TRANSVAGINAL OB ULTRASOUND TECHNIQUE: Transvaginal ultrasound was performed for complete evaluation of the gestation as well as the maternal uterus, adnexal regions, and pelvic cul-de-sac. COMPARISON:  None. FINDINGS: Intrauterine gestational sac: Single; irregular shape Yolk sac:  Visualized. Embryo:  Visualized. Cardiac Activity: Not Visualized. CRL:   5 mm   6 w 1 d                  US [facility]: 07/27/2018 Subchorionic hemorrhage:  Tiny subchorionic hemorrhage. Maternal uterus/adnexae: Normal appearance of both ovaries. No mass or abnormal free fluid identified. IMPRESSION: Findings meet definitive criteria for failed pregnancy. This follows SRU consensus guidelines: Diagnostic Criteria for Nonviable Pregnancy Early in the First Trimester. Macy Mis Engl J Med [phone];369:1443-51. Electronically Signed   By: Myles RosenthalJohn  Stahl M.D.   On: 12/02/2017 15:27   [place] Ob Transvaginal  Result Date: 11/27/2017 CLINICAL DATA:  Pregnant patient with bleeding/spotting. EXAM: OBSTETRIC <14 WK US AND TRANSVAGINAL OB US TECHNIQUE: Both transabdominal and transvaginal ultrasound examinations were performed for complete evaluation of the gestation as well as the maternal uterus, adnexal regions, and pelvic cul-de-sac. Transvaginal technique was performed to assess early pregnancy. COMPARISON:  None. FINDINGS: Intrauterine gestational sac: Single Yolk sac:  Visualized. Embryo:  Visualized. Cardiac Activity: Not Visualized. Heart Rate:   bpm MSD:   mm    w     d CRL:   6.2 mm   6 w   3 d                  [facility] Subchorionic hemorrhage: There is a rounded region of mild decreased subchorionic echogenicity which results in a convexity of the gestational sac. Maternal uterus/adnexae: Normal.  Corpus luteum cyst on the right. IMPRESSION: 1. An intrauterine pregnancy is identified. A yolk sac and fetus are seen. The crown-rump length is 6.2 mm. No cardiac activity is identified within the fetus. These findings are suspicious but not diagnostic of non viability. Recommend an ultrasound in 7-10 days for further characterization. 2. Rounded region of decreased echogenicity in the subchorionic region could represent  an adjacent fibroid, an unusual subchorionic hemorrhage, or a contraction. Recommend attention on follow-up. Electronically Signed   By: Gerome Sam III M.D   On: 11/27/2017 20:01    Assessment: Patient Active Problem List   Diagnosis Date Noted  . Eczema of both hands 08/16/2014  . S/P gastric bypass 07/04/2014  . Oligomenorrhea 02/11/2012  . Essential hypertension 02/11/2012  . Primary female infertility 02/11/2012  . Obesity 02/11/2012    Plan: Patient will undergo surgical management with dilation and evacuation.   The risks of surgery were discussed in detail with the patient including but not limited to: bleeding which may require transfusion or reoperation; infection which may require antibiotics; injury to surrounding organs which may involve bowel, bladder, ureters ; need for additional procedures including laparoscopy or laparotomy; thromboembolic phenomenon, surgical site problems and other postoperative/anesthesia complications. Likelihood of success in alleviating the patient's condition was discussed. Routine postoperative instructions will be reviewed with the patient and her family in detail after surgery.  The patient concurred with the proposed plan, giving informed written consent for the surgery.  Patient has been NPO  since last night she will remain NPO for procedure.  Anesthesia and OR aware.  Preoperative prophylactic antibiotics and SCDs ordered on call to the OR.  To OR when ready.  [place]alton Arch L. Erin Fulling, M.D., [facility] 12/23/2017 12:07 PM

## 2017-12-23 NOTE — Anesthesia Preprocedure Evaluation (Addendum)
Anesthesia Evaluation  Patient identified by MRN, date of birth, ID band Patient awake    Reviewed: Allergy & Precautions, H&P , NPO status , Patient's Chart, lab work & pertinent test results, reviewed documented beta blocker date and time   History of Anesthesia Complications Negative for: history of anesthetic complications  Airway Mallampati: II  TM Distance: >3 FB Neck ROM: full    Dental no notable dental hx. (+) Dental Advisory Given   Pulmonary neg pulmonary ROS,    Pulmonary exam normal breath sounds clear to auscultation       Cardiovascular hypertension,  Rhythm:regular Rate:Normal     Neuro/Psych negative neurological ROS  negative psych ROS   GI/Hepatic Neg liver ROS, GERD  ,  Endo/Other  Morbid obesity  Renal/GU negative Renal ROS     Musculoskeletal   Abdominal   Peds  Hematology   Anesthesia Other Findings   Reproductive/Obstetrics                            Anesthesia Physical  Anesthesia Plan  ASA: II  Anesthesia Plan: General   Post-op Pain Management:    Induction: Intravenous  PONV Risk Score and Plan: 3 and Ondansetron, Dexamethasone and Scopolamine patch - Pre-op  Airway Management Planned: LMA  Additional Equipment:   Intra-op Plan:   Post-operative Plan: Extubation in OR  Informed Consent: I have reviewed the patients History and Physical, chart, labs and discussed the procedure including the risks, benefits and alternatives for the proposed anesthesia with the patient or authorized representative who has indicated his/her understanding and acceptance.   Dental advisory given  Plan Discussed with: CRNA and Anesthesiologist  Anesthesia Plan Comments:        Anesthesia Quick Evaluation

## 2017-12-23 NOTE — Brief Op Note (Signed)
12/23/2017  2:25 PM  PATIENT:  Vanessa BasemanBeverly Burnett Braun  37 y.o. female  PRE-OPERATIVE DIAGNOSIS:  Incomplete AB  POST-OPERATIVE DIAGNOSIS:  Incomplete Abortion  PROCEDURE:  Procedure(s): DILATATION AND EVACUATION (N/A)  SURGEON:  Surgeon(s) and Role:    * Willodean RosenthalHarraway-Smith, Cyera Balboni, MD - Primary  ANESTHESIA:   general and paracervical block  EBL:  10 mL   BLOOD ADMINISTERED:none  DRAINS: none   LOCAL MEDICATIONS USED:  MARCAINE     SPECIMEN:  Source of Specimen:  products of conception  DISPOSITION OF SPECIMEN:  PATHOLOGY  COUNTS:  YES  TOURNIQUET:  * No tourniquets in log *  DICTATION: .Note written in EPIC  PLAN OF CARE: Discharge to home after PACU  PATIENT DISPOSITION:  PACU - hemodynamically stable.   Delay start of Pharmacological VTE agent (>24hrs) due to surgical blood loss or risk of bleeding: not applicable  Complications: none immediate  Ollin Hochmuth L. Harraway-Smith, M.D., [facility]AC[facility]

## 2017-12-23 NOTE — Anesthesia Postprocedure Evaluation (Signed)
Anesthesia Post Note  Patient: Vanessa Braun  Procedure(s) Performed: DILATATION AND EVACUATION (N/A )     Patient location during evaluation: PACU Anesthesia Type: General Level of consciousness: sedated Pain management: pain level controlled Vital Signs Assessment: post-procedure vital signs reviewed and stable Respiratory status: spontaneous breathing and respiratory function stable Cardiovascular status: stable Postop Assessment: no apparent nausea or vomiting Anesthetic complications: no    Last Vitals:  Vitals:   12/23/17 1500 12/23/17 1515  BP: 119/74 122/72  Pulse: (!) 42 (!) 43  Resp: 11 12  Temp:    SpO2: 100% 100%    Last Pain:  Vitals:   12/23/17 1209  TempSrc: Oral   Pain Goal: Patients Stated Pain Goal: 5 (12/23/17 1500)               Blanchie Zeleznik DANIEL

## 2017-12-24 ENCOUNTER — Encounter (HOSPITAL_COMMUNITY): Payer: Self-pay | Admitting: Obstetrics & Gynecology

## 2017-12-27 NOTE — Progress Notes (Signed)
Patient ID: Vanessa Braun, female   DOB: 09/19/1981, 37 y.o.   MRN: [id number]  Chief Complaint  Patient presents with  . Results  US done HPI Vanessa Braun is a 37 y.o. female.  [id number] Patient received 2 doses of cytotec for incomplete abortion and US shows retained POC HPI  Past Medical History:  Diagnosis Date  . Abnormal Pap smear   . Chronic dermatitis of hands   . Eczema   . GERD (gastroesophageal reflux disease)    lost weight with bypass surgery, no problems since  . PCOS (polycystic ovarian syndrome)     Past Surgical History:  Procedure Laterality Date  . ADENOIDECTOMY    . DILATION AND CURETTAGE OF UTERUS    . DILATION AND EVACUATION N/A 04/01/2014   Procedure: DILATATION AND EVACUATION;  Surgeon: Vanessa Fries, MD;  Location: [facility];  Service: Gynecology;  Laterality: N/A;  . DILATION AND EVACUATION N/A 12/23/2017   Procedure: DILATATION AND EVACUATION;  Surgeon: Vanessa Rosenthal, MD;  Location: [facility];  Service: Gynecology;  Laterality: N/A;  . GASTRIC BYPASS    . TONSILLECTOMY AND ADENOIDECTOMY  1999    Family History  Problem Relation Age of Onset  . Diabetes Mother   . Hyperlipidemia Mother   . Hypertension Mother   . Diabetes Father   . Kidney disease Father   . Hypertension Father   . Gestational diabetes Sister   . Depression Sister     Social History Social History   Tobacco Use  . Smoking status: Never Smoker  . Smokeless tobacco: Never Used  Substance Use Topics  . Alcohol use: Yes    Alcohol/week: 6.0 oz    Types: 10 Cans of beer per week    Frequency: Never    Comment: socially  . Drug use: No    Allergies  Allergen Reactions  . Other     "anything antibacterial"; some creams for dermatitis  . Penicillins Itching and Rash    Current Outpatient Medications  Medication Sig Dispense Refill  . CALCITRIOL PO Take by mouth.    . Cyanocobalamin (VITAMIN B-12 PO) Take by mouth.    Marland Kitchen DIPHENHYDRAMINE HCL PO Take  by mouth.    . doxycycline (VIBRAMYCIN) 100 MG capsule Take 1 capsule (100 mg total) by mouth 2 (two) times daily. 10 capsule 0  . fluocinonide-emollient (LIDEX-E) 0.05 % cream Apply 1 application topically 2 (two) times daily. 60 g 0  . HYDROXYZINE HCL PO Take by mouth.    Marland Kitchen ibuprofen (ADVIL,MOTRIN) 600 MG tablet Take 1 tablet (600 mg total) by mouth every 6 (six) hours as needed. 20 tablet 0  . oxyCODONE-acetaminophen (PERCOCET/ROXICET) 5-325 MG tablet Take 1 tablet by mouth every 4 (four) hours as needed for severe pain. 4 tablet 0  . predniSONE (DELTASONE) 20 MG tablet Two daily with food 10 tablet 0  . triamcinolone cream (KENALOG) 0.1 % Apply 1 application topically 2 (two) times daily. 30 g 0   No current facility-administered medications for this visit.     Review of Systems Review of Systems  Constitutional: Negative.   Genitourinary: Negative for pelvic pain.    Last menstrual period 09/24/2017.  Physical Exam Physical Exam  Constitutional: She appears well-developed. No distress.  Pulmonary/Chest: Effort normal.  Skin: Skin is warm and dry.  Psychiatric: She has a normal mood and affect. Her behavior is normal.  Vitals reviewed.   Data Reviewed CLINICAL DATA:  History of nonviable pregnancy. Follow-up evaluation.  EXAM: TRANSVAGINAL OB ULTRASOUND  TECHNIQUE: Transvaginal ultrasound was performed for complete evaluation of the gestation as well as the maternal uterus, adnexal regions, and pelvic cul-de-sac.  COMPARISON:  Pelvic ultrasound 12/15/2017  FINDINGS: Intrauterine gestational sac: Single  Yolk sac:  Not Visualized.  Embryo:  Not Visualized.  Cardiac Activity: Not Visualized.  Subchorionic hemorrhage:  Moderate  Maternal uterus/adnexae: The right and left ovaries are normal in appearance. No free fluid in the pelvis.  IMPRESSION: 1. There is continued presence of an intrauterine gestational sac however the embryo and yolk sac are  no longer visualized. 2. Moderate subchorionic hemorrhage.   Electronically Signed   By: Vanessa Beltrew  Braun M.D.   On: 12/22/2017 11:08  Assessment    Incomplete SAB    Plan    Offered suction D&C and the procedure was discussed and questions were answered. Patient desires surgical management  The risks of surgery were discussed in detail with the patient including but not limited to: bleeding which may require transfusion or reoperation; infection which may require prolonged hospitalization or re-hospitalization and antibiotic therapy; injury to bowel, bladder, ureters and major vessels or other surrounding organs; need for additional procedures including laparotomy; thromboembolic phenomenon, incisional problems and other postoperative or anesthesia complications.  Patient was told that the likelihood that her condition and symptoms will be treated effectively with this surgical management was very high; the postoperative expectations were also discussed in detail. The patient also understands the alternative treatment options which were discussed in full. All questions were answered.  She was told that she will be contacted by our surgical scheduler regarding the time and date of her surgery; routine preoperative instructions of having nothing to eat or drink after midnight on the day prior to surgery and also coming to the hospital 1.5 hours prior to her time of surgery were also emphasized.  She was told she may be called for a preoperative appointment about a week prior to surgery and will be given further preoperative instructions at that visit. Printed patient education handouts about the procedure were given to the patient to review at home.         Scheryl DarterJames Randa Braun 12/27/2017, 1:40 PM

## 2017-12-29 ENCOUNTER — Ambulatory Visit: Payer: Self-pay | Admitting: Student

## 2018-01-01 ENCOUNTER — Emergency Department (HOSPITAL_COMMUNITY): Payer: Medicaid Other

## 2018-01-01 ENCOUNTER — Emergency Department (HOSPITAL_COMMUNITY)
Admission: EM | Admit: 2018-01-01 | Discharge: 2018-01-02 | Disposition: A | Discharge status: Home / Self Care | Payer: Medicaid Other | Attending: Emergency Medicine | Admitting: Emergency Medicine

## 2018-01-01 ENCOUNTER — Encounter (HOSPITAL_COMMUNITY): Payer: Self-pay | Admitting: Emergency Medicine

## 2018-01-01 DIAGNOSIS — Y92009 Unspecified place in unspecified non-institutional (private) residence as the place of occurrence of the external cause: Secondary | ICD-10-CM | POA: Diagnosis not present

## 2018-01-01 DIAGNOSIS — I10 Essential (primary) hypertension: Secondary | ICD-10-CM | POA: Diagnosis not present

## 2018-01-01 DIAGNOSIS — Z79899 Other long term (current) drug therapy: Secondary | ICD-10-CM | POA: Insufficient documentation

## 2018-01-01 DIAGNOSIS — Y999 Unspecified external cause status: Secondary | ICD-10-CM | POA: Diagnosis not present

## 2018-01-01 DIAGNOSIS — S0033XA Contusion of nose, initial encounter: Secondary | ICD-10-CM | POA: Insufficient documentation

## 2018-01-01 DIAGNOSIS — S0081XA Abrasion of other part of head, initial encounter: Secondary | ICD-10-CM | POA: Insufficient documentation

## 2018-01-01 DIAGNOSIS — S0083XA Contusion of other part of head, initial encounter: Secondary | ICD-10-CM

## 2018-01-01 DIAGNOSIS — Y9389 Activity, other specified: Secondary | ICD-10-CM | POA: Insufficient documentation

## 2018-01-01 DIAGNOSIS — T148XXA Other injury of unspecified body region, initial encounter: Secondary | ICD-10-CM

## 2018-01-01 DIAGNOSIS — S0990XA Unspecified injury of head, initial encounter: Secondary | ICD-10-CM | POA: Diagnosis present
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
## 2018-01-02 MED ORDER — ACETAMINOPHEN 500 MG PO TABS
1000.0000 mg | ORAL_TABLET | Freq: Once | ORAL | Status: AC
  Administered 2018-01-02: 1000 mg via ORAL
  Filled 2018-01-02: qty 2

## 2018-01-02 NOTE — Discharge Instructions (Signed)
You can take Tylenol or Ibuprofen as directed for pain. You can alternate Tylenol and Ibuprofen every 4 hours. If you take Tylenol at 1pm, then you can take Ibuprofen at 5pm. Then you can take Tylenol again at 9pm.   You can apply bacitracin or neosporin to the abrasions.   Follow-up with your primary care doctor in the next 24-48 hours for further evaluation.  Return to the emergency department for any vision changes, difficulty walking, numbness/weakness of your arms or legs, chest pain, difficulty breathing or any other worsening or concerning symptoms.

## 2018-01-05 ENCOUNTER — Ambulatory Visit: Payer: Self-pay | Admitting: Obstetrics & Gynecology

## 2018-06-29 ENCOUNTER — Ambulatory Visit (INDEPENDENT_AMBULATORY_CARE_PROVIDER_SITE_OTHER): Payer: Medicaid Other | Admitting: General Practice

## 2018-06-29 ENCOUNTER — Encounter: Payer: Self-pay | Admitting: General Practice

## 2018-06-29 DIAGNOSIS — Z3201 Encounter for pregnancy test, result positive: Secondary | ICD-10-CM | POA: Diagnosis present

## 2018-06-29 DIAGNOSIS — O209 Hemorrhage in early pregnancy, unspecified: Secondary | ICD-10-CM

## 2018-06-29 LAB — POCT PREGNANCY, URINE: Preg Test, Ur: POSITIVE — AB

## 2018-06-29 LAB — HCG, QUANTITATIVE, PREGNANCY: hCG, Beta Chain, Quant, S: 122594 m[IU]/mL — ABNORMAL HIGH (ref <5)
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
## 2018-06-30 ENCOUNTER — Ambulatory Visit (INDEPENDENT_AMBULATORY_CARE_PROVIDER_SITE_OTHER): Payer: Self-pay | Admitting: *Deleted

## 2018-06-30 ENCOUNTER — Ambulatory Visit (HOSPITAL_COMMUNITY)
Admission: RE | Admit: 2018-06-30 | Discharge: 2018-06-30 | Disposition: A | Discharge status: Home / Self Care | Payer: Medicaid Other | Source: Ambulatory Visit | Attending: Obstetrics and Gynecology | Admitting: Obstetrics and Gynecology

## 2018-06-30 ENCOUNTER — Other Ambulatory Visit: Payer: Self-pay | Admitting: Obstetrics and Gynecology

## 2018-06-30 ENCOUNTER — Encounter: Payer: Self-pay | Admitting: Family Medicine

## 2018-06-30 DIAGNOSIS — Z3A01 Less than 8 weeks gestation of pregnancy: Secondary | ICD-10-CM | POA: Diagnosis not present

## 2018-06-30 DIAGNOSIS — O209 Hemorrhage in early pregnancy, unspecified: Secondary | ICD-10-CM

## 2018-06-30 DIAGNOSIS — O09521 Supervision of elderly multigravida, first trimester: Secondary | ICD-10-CM | POA: Diagnosis not present

## 2018-06-30 DIAGNOSIS — Z712 Person consulting for explanation of examination or test findings: Secondary | ICD-10-CM

## 2018-06-30 NOTE — Progress Notes (Signed)
[place] results from today reviewed by Dr. Debroah Loop.  Pt informed of results of viable pregnancy. Medication reconciliation completed. Pt advised to begin prenatal vitamins. She will schedule New Ob appt.

## 2018-07-02 ENCOUNTER — Encounter: Payer: Self-pay | Admitting: Obstetrics and Gynecology

## 2018-07-02 DIAGNOSIS — O09519 Supervision of elderly primigravida, unspecified trimester: Secondary | ICD-10-CM | POA: Insufficient documentation

## 2018-07-04 NOTE — Progress Notes (Signed)
I have reviewed the chart and agree with nursing staff's documentation of this patient's encounter.  Laurel Bing, MD 07/04/2018 9:05 AM

## 2018-07-15 ENCOUNTER — Encounter (HOSPITAL_COMMUNITY): Payer: Self-pay | Admitting: *Deleted

## 2018-07-15 ENCOUNTER — Inpatient Hospital Stay (HOSPITAL_COMMUNITY)
Admission: AD | Admit: 2018-07-15 | Discharge: 2018-07-15 | Disposition: A | Discharge status: Home / Self Care | Payer: Medicaid Other | Source: Ambulatory Visit | Attending: Obstetrics & Gynecology | Admitting: Obstetrics & Gynecology

## 2018-07-15 DIAGNOSIS — N898 Other specified noninflammatory disorders of vagina: Secondary | ICD-10-CM | POA: Diagnosis not present

## 2018-07-15 DIAGNOSIS — R300 Dysuria: Secondary | ICD-10-CM | POA: Diagnosis present

## 2018-07-15 DIAGNOSIS — Z3A09 9 weeks gestation of pregnancy: Secondary | ICD-10-CM | POA: Diagnosis not present

## 2018-07-15 DIAGNOSIS — A599 Trichomoniasis, unspecified: Secondary | ICD-10-CM

## 2018-07-15 DIAGNOSIS — O26891 Other specified pregnancy related conditions, first trimester: Secondary | ICD-10-CM | POA: Insufficient documentation

## 2018-07-15 DIAGNOSIS — R350 Frequency of micturition: Secondary | ICD-10-CM | POA: Insufficient documentation

## 2018-07-15 LAB — URINALYSIS, ROUTINE W REFLEX MICROSCOPIC
BILIRUBIN URINE: NEGATIVE
Glucose, UA: NEGATIVE mg/dL
HGB URINE DIPSTICK: NEGATIVE
Ketones, ur: NEGATIVE mg/dL
NITRITE: NEGATIVE
PROTEIN: NEGATIVE mg/dL
Specific Gravity, Urine: 1.01 (ref 1.005–1.030)
pH: 6 (ref 5.0–8.0)

## 2018-07-15 LAB — WET PREP, GENITAL
Sperm: NONE SEEN
YEAST WET PREP: NONE SEEN

## 2018-07-15 MED ORDER — METRONIDAZOLE 500 MG PO TABS: 500.0000 mg | ORAL_TABLET | Freq: Two times a day (BID) | ORAL | 0 refills | Status: DC

## 2018-07-15 NOTE — MAU Note (Signed)
Vanessa Braun is a 37 y.o. at 4150w6d here in MAU reporting: possible uti due to dysuria and increased frequency. Onset of complaint: ongoing for the past couple of days Pain score: denies. Only pain with urination Vitals:   07/15/18 1314  BP: 130/74  Pulse: 67  Resp: 18  Temp: 98.2 F (36.8 C)  SpO2: 100%     Lab orders placed from triage: ua

## 2018-07-15 NOTE — MAU Provider Note (Signed)
History   Vanessa Braun is a 37 year old female in today G1 at 9 weeks and 6 days with dysuria urinary frequency and vaginal discharge for over a week.  She denies any vaginal bleeding.  CSN: [id number]  Arrival date & time 07/15/18  1245   None     Chief Complaint  Patient presents with  . Urinary Tract Infection    HPI  Past Medical History:  Diagnosis Date  . Abnormal Pap smear   . Chronic dermatitis of hands   . Eczema   . GERD (gastroesophageal reflux disease)    lost weight with bypass surgery, no problems since  . PCOS (polycystic ovarian syndrome)     Past Surgical History:  Procedure Laterality Date  . ADENOIDECTOMY    . DILATION AND CURETTAGE OF UTERUS    . DILATION AND EVACUATION N/A 04/01/2014   Procedure: DILATATION AND EVACUATION;  Surgeon: Robley FriesVaishali R Mody, MD;  Location: [facility];  Service: Gynecology;  Laterality: N/A;  . DILATION AND EVACUATION N/A 12/23/2017   Procedure: DILATATION AND EVACUATION;  Surgeon: Willodean RosenthalHarraway-Smith, Carolyn, MD;  Location: [facility];  Service: Gynecology;  Laterality: N/A;  . GASTRIC BYPASS    . TONSILLECTOMY AND ADENOIDECTOMY  1999    Family History  Problem Relation Age of Onset  . Diabetes Mother   . Hyperlipidemia Mother   . Hypertension Mother   . Diabetes Father   . Kidney disease Father   . Hypertension Father   . Gestational diabetes Sister   . Depression Sister     Social History   Tobacco Use  . Smoking status: Never Smoker  . Smokeless tobacco: Never Used  Substance Use Topics  . Alcohol use: Yes    Alcohol/week: 10.0 standard drinks    Types: 10 Cans of beer per week    Frequency: Never    Comment: socially  . Drug use: No    OB History    Gravida  4   Para  0   Term  0   Preterm  0   AB  2   Living  0     SAB  1   TAB  0   Ectopic  0   Multiple  0   Live Births              Review of Systems  Constitutional: Negative.   HENT: Negative.   Eyes: Negative.   Respiratory: Negative.    Cardiovascular: Negative.   Gastrointestinal: Negative.   Genitourinary: Positive for dysuria, frequency and vaginal discharge.  Musculoskeletal: Negative.   Skin: Negative.   Allergic/Immunologic: Negative.   Neurological: Negative.   Hematological: Negative.   Psychiatric/Behavioral: Negative.     Allergies  Other and Penicillins  Home Medications    BP 130/74 (BP Location: Right Arm)   Pulse 67   Temp 98.2 F (36.8 C) (Oral)   Resp 18   Wt 112.5 kg   LMP 05/07/2018   SpO2 100% Comment: ra  BMI 36.64 kg/m   Physical Exam  Constitutional: She is oriented to person, place, and time. She appears well-developed and well-nourished.  HENT:  Head: Normocephalic.  Neck: Normal range of motion.  Cardiovascular: Normal rate, regular rhythm, normal heart sounds and intact distal pulses.  Pulmonary/Chest: Effort normal and breath sounds normal.  Abdominal: Soft. Bowel sounds are normal.  Genitourinary: Vaginal discharge found.  Musculoskeletal: Normal range of motion.  Neurological: She is alert and oriented to person, place, and time.  Skin: Skin is  warm and dry.  Psychiatric: She has a normal mood and affect. Her behavior is normal. Judgment and thought content normal.    MAU Course  Procedures (including critical care time)  Labs Reviewed  WET PREP, GENITAL - Abnormal; Notable for the following components:      Result Value   Trich, Wet Prep PRESENT (*)    Clue Cells Wet Prep HPF POC PRESENT (*)    WBC, Wet Prep HPF POC MANY (*)    All other components within normal limits  URINALYSIS, ROUTINE W REFLEX MICROSCOPIC - Abnormal; Notable for the following components:   APPearance HAZY (*)    Leukocytes, UA LARGE (*)    Bacteria, UA RARE (*)    All other components within normal limits  GC/CHLAMYDIA PROBE AMP ([place]) NOT AT [facility]   No results found.   1. Vaginal discharge during pregnancy in first trimester   2. Dysuria during pregnancy in first trimester    3. Urinary frequency       MDM  Vital signs are stable, bedside ultrasound was performed there is a viable fetus with a heart rate of 160.  Cultures obtained.  Wet prep positive for trichomoniasis.  Discussed findings with patient.  Stressed STD prevention.  We will treat with Flagyl and discharged home

## 2018-07-15 NOTE — Discharge Instructions (Signed)

## 2018-07-17 ENCOUNTER — Encounter (HOSPITAL_COMMUNITY): Payer: Self-pay | Admitting: *Deleted

## 2018-07-17 ENCOUNTER — Other Ambulatory Visit: Payer: Self-pay

## 2018-07-17 ENCOUNTER — Emergency Department (HOSPITAL_COMMUNITY)
Admission: EM | Admit: 2018-07-17 | Discharge: 2018-07-17 | Disposition: A | Discharge status: Home / Self Care | Payer: Medicaid Other | Attending: Emergency Medicine | Admitting: Emergency Medicine

## 2018-07-17 ENCOUNTER — Emergency Department (HOSPITAL_COMMUNITY): Payer: Medicaid Other

## 2018-07-17 DIAGNOSIS — S8391XA Sprain of unspecified site of right knee, initial encounter: Secondary | ICD-10-CM

## 2018-07-17 DIAGNOSIS — Z3A10 10 weeks gestation of pregnancy: Secondary | ICD-10-CM | POA: Diagnosis not present

## 2018-07-17 DIAGNOSIS — X58XXXA Exposure to other specified factors, initial encounter: Secondary | ICD-10-CM | POA: Insufficient documentation

## 2018-07-17 DIAGNOSIS — O10011 Pre-existing essential hypertension complicating pregnancy, first trimester: Secondary | ICD-10-CM | POA: Diagnosis not present

## 2018-07-17 DIAGNOSIS — Y999 Unspecified external cause status: Secondary | ICD-10-CM | POA: Diagnosis not present

## 2018-07-17 DIAGNOSIS — Y9389 Activity, other specified: Secondary | ICD-10-CM | POA: Diagnosis not present

## 2018-07-17 DIAGNOSIS — O9989 Other specified diseases and conditions complicating pregnancy, childbirth and the puerperium: Secondary | ICD-10-CM | POA: Insufficient documentation

## 2018-07-17 DIAGNOSIS — Y929 Unspecified place or not applicable: Secondary | ICD-10-CM | POA: Diagnosis not present

## 2018-07-17 LAB — GC/CHLAMYDIA PROBE AMP (~~LOC~~) NOT AT ARMC
Chlamydia: NEGATIVE
Neisseria Gonorrhea: NEGATIVE

## 2018-07-17 MED ORDER — HYDROCODONE-ACETAMINOPHEN 5-325 MG PO TABS
1.0000 | ORAL_TABLET | Freq: Once | ORAL | Status: AC
  Administered 2018-07-17: 1 via ORAL
  Filled 2018-07-17: qty 1

## 2018-07-17 NOTE — ED Triage Notes (Signed)
Rt knee pain 1200mn when she got up from the couch her patella jumped out of place painful since then.lmp  July 14th  [redacted] weeks pregnant

## 2018-07-17 NOTE — Discharge Instructions (Addendum)
You can take 1000 mg of Tylenol.  Do not exceed 4000 mg of Tylenol a day.  Follow the RICE (Rest, Ice, Compression, Elevation) protocol as directed.   Wear the knee brace for support and stabilization.  Use the crutches for support and stabilization.  As we discussed, your x-ray today showed no evidence of broken bones, dislocation.  You may have a muscle injury or a ligament injury that is causing her pain.  This may need further evaluation by orthopedics.  Please follow-up with either [facility] who you have seen before with referred orthopedic doctor in the paperwork.  Your blood pressure was slightly high here in the Emergency Department. This could be because you are in pain. Please have this rechecked by your primary doctor or your OB/GYN.  Return the emergency department for any worsening knee pain, redness or swelling of the knee, numbness/weakness of the leg fevers or any other worsening or concerning symptoms.

## 2018-07-17 NOTE — ED Provider Notes (Signed)
[facility] Note   CSN: [id number] Arrival date & time: 07/17/18  0409     History   Chief Complaint Chief Complaint  Patient presents with  . Knee Injury    HPI Vanessa Braun is a 37 y.o. female with PMH/o Eczema, GERD, PCOS who is currently [redacted] weeks pregnant who presents for evaluation of right knee pain that began earlier this morning at approximately midnight. Patient reports she was sitting on a couch and went to stand up and felt like it caused her knee to "go out of place." She states that the knee was painful to ambulate and walk on. She is unsure if it went back into place at home but noted that she continued to have pain. Her pain was worsened with trying to ambulate and bear weight on the leg. She reports having to limp on the RLE secondary to pain. She took 1 dose of tylenol at home for pain. Patient states she has never had this happen before. She has had issues with her left knee before and thinks she saw [facility] previously. Patient denies any numbness, warmth, erythema, fevers.   The history is provided by the patient.    Past Medical History:  Diagnosis Date  . Abnormal Pap smear   . Chronic dermatitis of hands   . Eczema   . GERD (gastroesophageal reflux disease)    lost weight with bypass surgery, no problems since  . PCOS (polycystic ovarian syndrome)     Patient Active Problem List   Diagnosis Date Noted  . AMA (advanced maternal age) primigravida 35+ 07/02/2018  . Eczema of both hands 08/16/2014  . S/P gastric bypass 07/04/2014  . Oligomenorrhea 02/11/2012  . Essential hypertension 02/11/2012  . Primary female infertility 02/11/2012  . Obesity 02/11/2012    Past Surgical History:  Procedure Laterality Date  . ADENOIDECTOMY    . DILATION AND CURETTAGE OF UTERUS    . DILATION AND EVACUATION N/A 04/01/2014   Procedure: DILATATION AND EVACUATION;  Surgeon: Robley FriesVaishali R Mody, MD;  Location: [facility];  Service: Gynecology;  Laterality: N/A;  . DILATION AND EVACUATION N/A 12/23/2017   Procedure: DILATATION AND EVACUATION;  Surgeon: Willodean RosenthalHarraway-Smith, Carolyn, MD;  Location: [facility];  Service: Gynecology;  Laterality: N/A;  . GASTRIC BYPASS    . TONSILLECTOMY AND ADENOIDECTOMY  1999     OB History    Gravida  4   Para  0   Term  0   Preterm  0   AB  2   Living  0     SAB  1   TAB  0   Ectopic  0   Multiple  0   Live Births               Home Medications    Prior to Admission medications   Medication Sig Start Date End Date Taking? Authorizing Provider  CALCITRIOL PO Take by mouth.    [provider]  Cyanocobalamin (VITAMIN B-12 PO) Take by mouth.    [provider]  DIPHENHYDRAMINE HCL PO Take by mouth.    [provider]  metroNIDAZOLE (FLAGYL) 500 MG tablet Take 1 tablet (500 mg total) by mouth 2 (two) times daily. 07/15/18   Montez MoritaLawson, Marie D, CNM  oxyCODONE-acetaminophen (PERCOCET/ROXICET) 5-325 MG tablet Take 1 tablet by mouth every 4 (four) hours as needed for severe pain. Patient not taking: Reported on 06/30/2018 12/09/17   Marylene LandKooistra, Kathryn Lorraine,  CNM  Prenatal Vit-Fe Fumarate-FA (PRENATAL MULTIVITAMIN) TABS tablet Take 1 tablet by mouth daily at 12 noon.    [provider]  triamcinolone cream (KENALOG) 0.1 % Apply 1 application topically 2 (two) times daily. Patient not taking: Reported on 06/30/2018 07/02/17   Hayden Rasmussen, NP    Family History Family History  Problem Relation Age of Onset  . Diabetes Mother   . Hyperlipidemia Mother   . Hypertension Mother   . Diabetes Father   . Kidney disease Father   . Hypertension Father   . Gestational diabetes Sister   . Depression Sister     Social History Social History   Tobacco Use  . Smoking status: Never Smoker  . Smokeless tobacco: Never Used  Substance Use Topics  . Alcohol use: Yes    Alcohol/week: 10.0 standard drinks    Types: 10 Cans of beer per week      Frequency: Never    Comment: socially  . Drug use: No     Allergies   Other and Penicillins   Review of Systems Review of Systems  Constitutional: Negative for fever.  Musculoskeletal:       Right knee pain  Skin: Negative for color change.  Neurological: Negative for numbness.  All other systems reviewed and are negative.    Physical Exam Updated Vital Signs BP (!) 143/74   Pulse 80   Temp 98.4 F (36.9 C)   Resp 18   Ht 5\' 9"  (1.753 m)   Wt 112.5 kg   LMP 05/07/2018   SpO2 100%   BMI 36.62 kg/m   Physical Exam  Constitutional: She appears well-developed and well-nourished.  HENT:  Head: Normocephalic and atraumatic.  Eyes: Conjunctivae and EOM are normal. Right eye exhibits no discharge. Left eye exhibits no discharge. No scleral icterus.  Cardiovascular:  Pulses:      Dorsalis pedis pulses are 2+ on the right side, and 2+ on the left side.  Pulmonary/Chest: Effort normal.  Musculoskeletal:       Right knee: Tenderness found.  Tenderness to palpation noted to the lateral aspect of the right knee. There is some mild overlying soft tissue swelling and ecchymosis. No overlying warmth, erythema. No deformity or crepitus. No tenderness to palpation to the patella. Extension intact. Limited flexion secondary to pain. Negative anterior and posterior drawer test. Pain with varus stress. No tenderness to palpation noted to distal tib/fib, ankle. No tenderness to palpation to left knee. FROM of left knee without any difficulty.   Neurological: She is alert.  Sensation intact along major nerve distributions of BLE  Skin: Skin is warm and dry. Capillary refill takes less than 2 seconds.  Good distal cap refill. RLE is not dusky in appearance or cool to touch.  Psychiatric: She has a normal mood and affect. Her speech is normal and behavior is normal.  Nursing note and vitals reviewed.    ED Treatments / Results  Labs (all labs ordered are listed, but only abnormal  results are displayed) Labs Reviewed - No data to display  EKG None  Radiology Dg Knee Complete 4 Views Right  Result Date: 07/17/2018 CLINICAL DATA:  37 year old female with right knee pain. EXAM: RIGHT KNEE - COMPLETE 4+ VIEW COMPARISON:  Right knee radiograph dated 05/25/2016 FINDINGS: No evidence of fracture, dislocation, or joint effusion. No evidence of arthropathy or other focal bone abnormality. Soft tissues are unremarkable. IMPRESSION: Negative. Electronically Signed   By: Elgie Collard M.D.   On:  07/17/2018 04:58    Procedures Procedures (including critical care time)  Medications Ordered in ED Medications  HYDROcodone-acetaminophen (NORCO/VICODIN) 5-325 MG per tablet 1 tablet (1 tablet Oral Given 07/17/18 0654)     Initial Impression / Assessment and Plan / ED Course  I have reviewed the triage vital signs and the nursing notes.  Pertinent labs & imaging results that were available during my care of the patient were reviewed by me and considered in my medical decision making (see chart for details).     37 y.o. F who is currently [redacted] weeks pregnant who presents for evaluation of right knee pain that began earlier this morning at midnight. Reports feeling like the knee popped after getting off the couch. Difficulty ambulating and bearing weight secondary to pain. Patient is afebrile, non-toxic appearing, sitting comfortably on examination table. Vital signs reviewed and stable. Patient is slightly hypertensive here in the ED. [place] secondary to pain.  Review of previous OB/GYN notes show that she was seen on 07/15/17 and had an estimated gestational age of [redacted] weeks.  No concern for preeclampsia.  Patient is neurovascularly intact. Consider fracture vs dislocation vs sprain. History/physical exam is not concerning for DVT, septic arthritis, acute arterial embolism. Initial XR ordered at triage.   XR reviewed. Negative for any acute fracture or dislocation. No evidence of  effusion.   Discussed results with patient.  We will plan to treat as a knee sprain.  Knee immobilizer and crutches ordered.  Patient states that she has seen orthopedics before for previous knee injury on the left knee.  She does not recall specifically who it is but she thinks may be [facility].  Will give her both orthopedic and on-call orthopedic follow-up.  Encourage at home supportive care measures.  Patient had ample opportunity for questions and discussion. All patient's questions were answered with full understanding. Strict return precautions discussed. Patient expresses understanding and agreement to plan.   Final Clinical Impressions(s) / ED Diagnoses   Final diagnoses:  Sprain of right knee, unspecified ligament, initial encounter    ED Discharge Orders    None       Maxwell Caul, PA-C 07/17/18 0804    Gilda Crease, MD 07/17/18 [phone]

## 2018-07-21 ENCOUNTER — Encounter: Payer: Self-pay | Admitting: Family Medicine

## 2018-07-21 ENCOUNTER — Other Ambulatory Visit (HOSPITAL_COMMUNITY)
Admission: RE | Admit: 2018-07-21 | Discharge: 2018-07-21 | Disposition: A | Discharge status: Home / Self Care | Payer: Medicaid Other | Source: Ambulatory Visit | Attending: Family Medicine | Admitting: Family Medicine

## 2018-07-21 ENCOUNTER — Ambulatory Visit (INDEPENDENT_AMBULATORY_CARE_PROVIDER_SITE_OTHER): Payer: Self-pay | Admitting: Family Medicine

## 2018-07-21 VITALS — BP 129/88 | HR 59 | Wt 245.0 lb

## 2018-07-21 DIAGNOSIS — O99840 Bariatric surgery status complicating pregnancy, unspecified trimester: Secondary | ICD-10-CM

## 2018-07-21 DIAGNOSIS — O10911 Unspecified pre-existing hypertension complicating pregnancy, first trimester: Secondary | ICD-10-CM

## 2018-07-21 DIAGNOSIS — N96 Recurrent pregnancy loss: Secondary | ICD-10-CM | POA: Diagnosis not present

## 2018-07-21 DIAGNOSIS — E559 Vitamin D deficiency, unspecified: Secondary | ICD-10-CM

## 2018-07-21 DIAGNOSIS — A599 Trichomoniasis, unspecified: Secondary | ICD-10-CM

## 2018-07-21 DIAGNOSIS — O0990 Supervision of high risk pregnancy, unspecified, unspecified trimester: Secondary | ICD-10-CM

## 2018-07-21 DIAGNOSIS — O10919 Unspecified pre-existing hypertension complicating pregnancy, unspecified trimester: Secondary | ICD-10-CM

## 2018-07-21 DIAGNOSIS — Z3A10 10 weeks gestation of pregnancy: Secondary | ICD-10-CM | POA: Insufficient documentation

## 2018-07-21 DIAGNOSIS — O0991 Supervision of high risk pregnancy, unspecified, first trimester: Secondary | ICD-10-CM | POA: Diagnosis not present

## 2018-07-21 DIAGNOSIS — O09511 Supervision of elderly primigravida, first trimester: Secondary | ICD-10-CM

## 2018-07-21 DIAGNOSIS — Z23 Encounter for immunization: Secondary | ICD-10-CM | POA: Diagnosis not present

## 2018-07-21 DIAGNOSIS — Z98891 History of uterine scar from previous surgery: Secondary | ICD-10-CM | POA: Insufficient documentation

## 2018-07-21 LAB — POCT URINALYSIS DIP (DEVICE)
Bilirubin Urine: NEGATIVE
GLUCOSE, UA: NEGATIVE mg/dL
Hgb urine dipstick: NEGATIVE
Ketones, ur: NEGATIVE mg/dL
Nitrite: NEGATIVE
PH: 6 (ref 5.0–8.0)
PROTEIN: NEGATIVE mg/dL
Specific Gravity, Urine: 1.015 (ref 1.005–1.030)
Urobilinogen, UA: 0.2 mg/dL (ref 0.0–1.0)

## 2018-07-21 MED ORDER — ASPIRIN 81 MG PO CHEW: 81.0000 mg | CHEWABLE_TABLET | Freq: Every day | ORAL | 3 refills | Status: DC

## 2018-07-21 MED ORDER — METRONIDAZOLE 500 MG PO TABS: 500.0000 mg | ORAL_TABLET | Freq: Two times a day (BID) | ORAL | 0 refills | Status: DC

## 2018-07-21 NOTE — Patient Instructions (Signed)
 First Trimester of Pregnancy The first trimester of pregnancy is from week 1 until the end of week 13 (months 1 through 3). A week after a sperm fertilizes an egg, the egg will implant on the wall of the uterus. This embryo will begin to develop into a baby. Genes from you and your partner will form the baby. The female genes will determine whether the baby will be a boy or a girl. At 6-8 weeks, the eyes and face will be formed, and the heartbeat can be seen on ultrasound. At the end of 12 weeks, all the baby's organs will be formed. Now that you are pregnant, you will want to do everything you can to have a healthy baby. Two of the most important things are to get good prenatal care and to follow your health care provider's instructions. Prenatal care is all the medical care you receive before the baby's birth. This care will help prevent, find, and treat any problems during the pregnancy and childbirth. Body changes during your first trimester Your body goes through many changes during pregnancy. The changes vary from woman to woman.  You may gain or lose a couple of pounds at first.  You may feel sick to your stomach (nauseous) and you may throw up (vomit). If the vomiting is uncontrollable, call your health care provider.  You may tire easily.  You may develop headaches that can be relieved by medicines. All medicines should be approved by your health care provider.  You may urinate more often. Painful urination may mean you have a bladder infection.  You may develop heartburn as a result of your pregnancy.  You may develop constipation because certain hormones are causing the muscles that push stool through your intestines to slow down.  You may develop hemorrhoids or swollen veins (varicose veins).  Your breasts may begin to grow larger and become tender. Your nipples may stick out more, and the tissue that surrounds them (areola) may become darker.  Your gums may bleed and may be  sensitive to brushing and flossing.  Dark spots or blotches (chloasma, mask of pregnancy) may develop on your face. This will likely fade after the baby is born.  Your menstrual periods will stop.  You may have a loss of appetite.  You may develop cravings for certain kinds of food.  You may have changes in your emotions from day to day, such as being excited to be pregnant or being concerned that something may go wrong with the pregnancy and baby.  You may have more vivid and strange dreams.  You may have changes in your hair. These can include thickening of your hair, rapid growth, and changes in texture. Some women also have hair loss during or after pregnancy, or hair that feels dry or thin. Your hair will most likely return to normal after your baby is born.  What to expect at prenatal visits During a routine prenatal visit:  You will be weighed to make sure you and the baby are growing normally.  Your blood pressure will be taken.  Your abdomen will be measured to track your baby's growth.  The fetal heartbeat will be listened to between weeks 10 and 14 of your pregnancy.  Test results from any previous visits will be discussed.  Your health care provider may ask you:  How you are feeling.  If you are feeling the baby move.  If you have had any abnormal symptoms, such as leaking fluid, bleeding, severe   headaches, or abdominal cramping.  If you are using any tobacco products, including cigarettes, chewing tobacco, and electronic cigarettes.  If you have any questions.  Other tests that may be performed during your first trimester include:  Blood tests to find your blood type and to check for the presence of any previous infections. The tests will also be used to check for low iron levels (anemia) and protein on red blood cells (Rh antibodies). Depending on your risk factors, or if you previously had diabetes during pregnancy, you may have tests to check for high blood  sugar that affects pregnant women (gestational diabetes).  Urine tests to check for infections, diabetes, or protein in the urine.  An ultrasound to confirm the proper growth and development of the baby.  Fetal screens for spinal cord problems (spina bifida) and Down syndrome.  HIV (human immunodeficiency virus) testing. Routine prenatal testing includes screening for HIV, unless you choose not to have this test.  You may need other tests to make sure you and the baby are doing well.  Follow these instructions at home: Medicines  Follow your health care provider's instructions regarding medicine use. Specific medicines may be either safe or unsafe to take during pregnancy.  Take a prenatal vitamin that contains at least 600 micrograms (mcg) of folic acid.  If you develop constipation, try taking a stool softener if your health care provider approves. Eating and drinking  Eat a balanced diet that includes fresh fruits and vegetables, whole grains, good sources of protein such as meat, eggs, or tofu, and low-fat dairy. Your health care provider will help you determine the amount of weight gain that is right for you.  Avoid raw meat and uncooked cheese. These carry germs that can cause birth defects in the baby.  Eating four or five small meals rather than three large meals a day may help relieve nausea and vomiting. If you start to feel nauseous, eating a few soda crackers can be helpful. Drinking liquids between meals, instead of during meals, also seems to help ease nausea and vomiting.  Limit foods that are high in fat and processed sugars, such as fried and sweet foods.  To prevent constipation: ? Eat foods that are high in fiber, such as fresh fruits and vegetables, whole grains, and beans. ? Drink enough fluid to keep your urine clear or pale yellow. Activity  Exercise only as directed by your health care provider. Most women can continue their usual exercise routine during  pregnancy. Try to exercise for 30 minutes at least 5 days a week. Exercising will help you: ? Control your weight. ? Stay in shape. ? Be prepared for labor and delivery.  Experiencing pain or cramping in the lower abdomen or lower back is a good sign that you should stop exercising. Check with your health care provider before continuing with normal exercises.  Try to avoid standing for long periods of time. Move your legs often if you must stand in one place for a long time.  Avoid heavy lifting.  Wear low-heeled shoes and practice good posture.  You may continue to have sex unless your health care provider tells you not to. Relieving pain and discomfort  Wear a good support bra to relieve breast tenderness.  Take warm sitz baths to soothe any pain or discomfort caused by hemorrhoids. Use hemorrhoid cream if your health care provider approves.  Rest with your legs elevated if you have leg cramps or low back pain.  If you   develop varicose veins in your legs, wear support hose. Elevate your feet for 15 minutes, 3-4 times a day. Limit salt in your diet. Prenatal care  Schedule your prenatal visits by the twelfth week of pregnancy. They are usually scheduled monthly at first, then more often in the last 2 months before delivery.  Write down your questions. Take them to your prenatal visits.  Keep all your prenatal visits as told by your health care provider. This is important. Safety  Wear your seat belt at all times when driving.  Make a list of emergency phone numbers, including numbers for family, friends, the hospital, and police and fire departments. General instructions  Ask your health care provider for a referral to a local prenatal education class. Begin classes no later than the beginning of month 6 of your pregnancy.  Ask for help if you have counseling or nutritional needs during pregnancy. Your health care provider can offer advice or refer you to specialists for help  with various needs.  Do not use hot tubs, steam rooms, or saunas.  Do not douche or use tampons or scented sanitary pads.  Do not cross your legs for long periods of time.  Avoid cat litter boxes and soil used by cats. These carry germs that can cause birth defects in the baby and possibly loss of the fetus by miscarriage or stillbirth.  Avoid all smoking, herbs, alcohol, and medicines not prescribed by your health care provider. Chemicals in these products affect the formation and growth of the baby.  Do not use any products that contain nicotine or tobacco, such as cigarettes and e-cigarettes. If you need help quitting, ask your health care provider. You may receive counseling support and other resources to help you quit.  Schedule a dentist appointment. At home, brush your teeth with a soft toothbrush and be gentle when you floss. Contact a health care provider if:  You have dizziness.  You have mild pelvic cramps, pelvic pressure, or nagging pain in the abdominal area.  You have persistent nausea, vomiting, or diarrhea.  You have a bad smelling vaginal discharge.  You have pain when you urinate.  You notice increased swelling in your face, hands, legs, or ankles.  You are exposed to fifth disease or chickenpox.  You are exposed to German measles (rubella) and have never had it. Get help right away if:  You have a fever.  You are leaking fluid from your vagina.  You have spotting or bleeding from your vagina.  You have severe abdominal cramping or pain.  You have rapid weight gain or loss.  You vomit blood or material that looks like coffee grounds.  You develop a severe headache.  You have shortness of breath.  You have any kind of trauma, such as from a fall or a car accident. Summary  The first trimester of pregnancy is from week 1 until the end of week 13 (months 1 through 3).  Your body goes through many changes during pregnancy. The changes vary from  woman to woman.  You will have routine prenatal visits. During those visits, your health care provider will examine you, discuss any test results you may have, and talk with you about how you are feeling. This information is not intended to replace advice given to you by your health care provider. Make sure you discuss any questions you have with your health care provider. Document Released: 10/05/2001 Document Revised: 09/22/2016 Document Reviewed: 09/22/2016 [employer] Interactive Patient Education  2018   [employer]   Breastfeeding Choosing to breastfeed is one of the best decisions you can make for yourself and your baby. A change in hormones during pregnancy causes your breasts to make breast milk in your milk-producing glands. Hormones prevent breast milk from being released before your baby is born. They also prompt milk flow after birth. Once breastfeeding has begun, thoughts of your baby, as well as his or her sucking or crying, can stimulate the release of milk from your milk-producing glands. Benefits of breastfeeding Research shows that breastfeeding offers many health benefits for infants and mothers. It also offers a cost-free and convenient way to feed your baby. For your baby  Your first milk (colostrum) helps your baby's digestive system to function better.  Special cells in your milk (antibodies) help your baby to fight off infections.  Breastfed babies are less likely to develop asthma, allergies, obesity, or type 2 diabetes. They are also at lower risk for sudden infant death syndrome (SIDS).  Nutrients in breast milk are better able to meet your baby's needs compared to infant formula.  Breast milk improves your baby's brain development. For you  Breastfeeding helps to create a very special bond between you and your baby.  Breastfeeding is convenient. Breast milk costs nothing and is always available at the correct temperature.  Breastfeeding helps to burn calories.  It helps you to lose the weight that you gained during pregnancy.  Breastfeeding makes your uterus return faster to its size before pregnancy. It also slows bleeding (lochia) after you give birth.  Breastfeeding helps to lower your risk of developing type 2 diabetes, osteoporosis, rheumatoid arthritis, cardiovascular disease, and breast, ovarian, uterine, and endometrial cancer later in life. Breastfeeding basics Starting breastfeeding  Find a comfortable place to sit or lie down, with your neck and back well-supported.  Place a pillow or a rolled-up blanket under your baby to bring him or her to the level of your breast (if you are seated). Nursing pillows are specially designed to help support your arms and your baby while you breastfeed.  Make sure that your baby's tummy (abdomen) is facing your abdomen.  Gently massage your breast. With your fingertips, massage from the outer edges of your breast inward toward the nipple. This encourages milk flow. If your milk flows slowly, you may need to continue this action during the feeding.  Support your breast with 4 fingers underneath and your thumb above your nipple (make the letter "C" with your hand). Make sure your fingers are well away from your nipple and your baby's mouth.  Stroke your baby's lips gently with your finger or nipple.  When your baby's mouth is open wide enough, quickly bring your baby to your breast, placing your entire nipple and as much of the areola as possible into your baby's mouth. The areola is the colored area around your nipple. ? More areola should be visible above your baby's upper lip than below the lower lip. ? Your baby's lips should be opened and extended outward (flanged) to ensure an adequate, comfortable latch. ? Your baby's tongue should be between his or her lower gum and your breast.  Make sure that your baby's mouth is correctly positioned around your nipple (latched). Your baby's lips should create a  seal on your breast and be turned out (everted).  It is common for your baby to suck about 2-3 minutes in order to start the flow of breast milk. Latching Teaching your baby how to latch onto   your breast properly is very important. An improper latch can cause nipple pain, decreased milk supply, and poor weight gain in your baby. Also, if your baby is not latched onto your nipple properly, he or she may swallow some air during feeding. This can make your baby fussy. Burping your baby when you switch breasts during the feeding can help to get rid of the air. However, teaching your baby to latch on properly is still the best way to prevent fussiness from swallowing air while breastfeeding. Signs that your baby has successfully latched onto your nipple  Silent tugging or silent sucking, without causing you pain. Infant's lips should be extended outward (flanged).  Swallowing heard between every 3-4 sucks once your milk has started to flow (after your let-down milk reflex occurs).  Muscle movement above and in front of his or her ears while sucking.  Signs that your baby has not successfully latched onto your nipple  Sucking sounds or smacking sounds from your baby while breastfeeding.  Nipple pain.  If you think your baby has not latched on correctly, slip your finger into the corner of your baby's mouth to break the suction and place it between your baby's gums. Attempt to start breastfeeding again. Signs of successful breastfeeding Signs from your baby  Your baby will gradually decrease the number of sucks or will completely stop sucking.  Your baby will fall asleep.  Your baby's body will relax.  Your baby will retain a small amount of milk in his or her mouth.  Your baby will let go of your breast by himself or herself.  Signs from you  Breasts that have increased in firmness, weight, and size 1-3 hours after feeding.  Breasts that are softer immediately after  breastfeeding.  Increased milk volume, as well as a change in milk consistency and color by the fifth day of breastfeeding.  Nipples that are not sore, cracked, or bleeding.  Signs that your baby is getting enough milk  Wetting at least 1-2 diapers during the first 24 hours after birth.  Wetting at least 5-6 diapers every 24 hours for the first week after birth. The urine should be clear or pale yellow by the age of 5 days.  Wetting 6-8 diapers every 24 hours as your baby continues to grow and develop.  At least 3 stools in a 24-hour period by the age of 5 days. The stool should be soft and yellow.  At least 3 stools in a 24-hour period by the age of 7 days. The stool should be seedy and yellow.  No loss of weight greater than 10% of birth weight during the first 3 days of life.  Average weight gain of 4-7 oz (113-198 g) per week after the age of 4 days.  Consistent daily weight gain by the age of 5 days, without weight loss after the age of 2 weeks. After a feeding, your baby may spit up a small amount of milk. This is normal. Breastfeeding frequency and duration Frequent feeding will help you make more milk and can prevent sore nipples and extremely full breasts (breast engorgement). Breastfeed when you feel the need to reduce the fullness of your breasts or when your baby shows signs of hunger. This is called "breastfeeding on demand." Signs that your baby is hungry include:  Increased alertness, activity, or restlessness.  Movement of the head from side to side.  Opening of the mouth when the corner of the mouth or cheek is stroked (rooting).    Increased sucking sounds, smacking lips, cooing, sighing, or squeaking.  Hand-to-mouth movements and sucking on fingers or hands.  Fussing or crying.  Avoid introducing a pacifier to your baby in the first 4-6 weeks after your baby is born. After this time, you may choose to use a pacifier. Research has shown that pacifier use during  the first year of a baby's life decreases the risk of sudden infant death syndrome (SIDS). Allow your baby to feed on each breast as long as he or she wants. When your baby unlatches or falls asleep while feeding from the first breast, offer the second breast. Because newborns are often sleepy in the first few weeks of life, you may need to awaken your baby to get him or her to feed. Breastfeeding times will vary from baby to baby. However, the following rules can serve as a guide to help you make sure that your baby is properly fed:  Newborns (babies 4 weeks of age or younger) may breastfeed every 1-3 hours.  Newborns should not go without breastfeeding for longer than 3 hours during the day or 5 hours during the night.  You should breastfeed your baby a minimum of 8 times in a 24-hour period.  Breast milk pumping Pumping and storing breast milk allows you to make sure that your baby is exclusively fed your breast milk, even at times when you are unable to breastfeed. This is especially important if you go back to work while you are still breastfeeding, or if you are not able to be present during feedings. Your lactation consultant can help you find a method of pumping that works best for you and give you guidelines about how long it is safe to store breast milk. Caring for your breasts while you breastfeed Nipples can become dry, cracked, and sore while breastfeeding. The following recommendations can help keep your breasts moisturized and healthy:  Avoid using soap on your nipples.  Wear a supportive bra designed especially for nursing. Avoid wearing underwire-style bras or extremely tight bras (sports bras).  Air-dry your nipples for 3-4 minutes after each feeding.  Use only cotton bra pads to absorb leaked breast milk. Leaking of breast milk between feedings is normal.  Use lanolin on your nipples after breastfeeding. Lanolin helps to maintain your skin's normal moisture barrier. Pure  lanolin is not harmful (not toxic) to your baby. You may also hand express a few drops of breast milk and gently massage that milk into your nipples and allow the milk to air-dry.  In the first few weeks after giving birth, some women experience breast engorgement. Engorgement can make your breasts feel heavy, warm, and tender to the touch. Engorgement peaks within 3-5 days after you give birth. The following recommendations can help to ease engorgement:  Completely empty your breasts while breastfeeding or pumping. You may want to start by applying warm, moist heat (in the shower or with warm, water-soaked hand towels) just before feeding or pumping. This increases circulation and helps the milk flow. If your baby does not completely empty your breasts while breastfeeding, pump any extra milk after he or she is finished.  Apply ice packs to your breasts immediately after breastfeeding or pumping, unless this is too uncomfortable for you. To do this: ? Put ice in a plastic bag. ? Place a towel between your skin and the bag. ? Leave the ice on for 20 minutes, 2-3 times a day.  Make sure that your baby is latched on and   positioned properly while breastfeeding.  If engorgement persists after 48 hours of following these recommendations, contact your health care provider or a lactation consultant. Overall health care recommendations while breastfeeding  Eat 3 healthy meals and 3 snacks every day. Well-nourished mothers who are breastfeeding need an additional 450-500 calories a day. You can meet this requirement by increasing the amount of a balanced diet that you eat.  Drink enough water to keep your urine pale yellow or clear.  Rest often, relax, and continue to take your prenatal vitamins to prevent fatigue, stress, and low vitamin and mineral levels in your body (nutrient deficiencies).  Do not use any products that contain nicotine or tobacco, such as cigarettes and e-cigarettes. Your baby may  be harmed by chemicals from cigarettes that pass into breast milk and exposure to secondhand smoke. If you need help quitting, ask your health care provider.  Avoid alcohol.  Do not use illegal drugs or marijuana.  Talk with your health care provider before taking any medicines. These include over-the-counter and prescription medicines as well as vitamins and herbal supplements. Some medicines that may be harmful to your baby can pass through breast milk.  It is possible to become pregnant while breastfeeding. If birth control is desired, ask your health care provider about options that will be safe while breastfeeding your baby. Where to find more information: [employer]: www.llli.org Contact a health care provider if:  You feel like you want to stop breastfeeding or have become frustrated with breastfeeding.  Your nipples are cracked or bleeding.  Your breasts are red, tender, or warm.  You have: ? Painful breasts or nipples. ? A swollen area on either breast. ? A fever or chills. ? Nausea or vomiting. ? Drainage other than breast milk from your nipples.  Your breasts do not become full before feedings by the fifth day after you give birth.  You feel sad and depressed.  Your baby is: ? Too sleepy to eat well. ? Having trouble sleeping. ? More than 1 week old and wetting fewer than 6 diapers in a 24-hour period. ? Not gaining weight by 5 days of age.  Your baby has fewer than 3 stools in a 24-hour period.  Your baby's skin or the white parts of his or her eyes become yellow. Get help right away if:  Your baby is overly tired (lethargic) and does not want to wake up and feed.  Your baby develops an unexplained fever. Summary  Breastfeeding offers many health benefits for infant and mothers.  Try to breastfeed your infant when he or she shows early signs of hunger.  Gently tickle or stroke your baby's lips with your finger or nipple to allow the  baby to open his or her mouth. Bring the baby to your breast. Make sure that much of the areola is in your baby's mouth. Offer one side and burp the baby before you offer the other side.  Talk with your health care provider or lactation consultant if you have questions or you face problems as you breastfeed. This information is not intended to replace advice given to you by your health care provider. Make sure you discuss any questions you have with your health care provider. Document Released: 10/11/2005 Document Revised: 11/12/2016 Document Reviewed: 11/12/2016 [employer] Interactive Patient Education  2018 [employer]  

## 2018-07-21 NOTE — Progress Notes (Signed)
FHR determined by bedside ultrasound, 177bpm

## 2018-07-21 NOTE — Progress Notes (Signed)
Subjective:   Vanessa Braun is a 37 y.o. G4P0030 at [redacted]w[redacted]d by LMP, early ultrasound being seen today for her first obstetrical visit.  Her obstetrical history is significant for advanced maternal age, obesity and ? h/o HTN--certainly prior to gastric bypass, and some elevated since.  Pregnancy history fully reviewed.  Patient reports nausea and vomiting.  HISTORY: OB History  Gravida Para Term Preterm AB Living  4 0 0 0 3 0  SAB TAB Ectopic Multiple Live Births  3 0 0 0 0    # Outcome Date GA Lbr Len/2nd Weight Sex Delivery Anes PTL Lv  4 Current           3 SAB 2019 [redacted]w[redacted]d         2 SAB 2013 [redacted]w[redacted]d         1 SAB 2012 [redacted]w[redacted]d          Past Medical History:  Diagnosis Date  . Abnormal Pap smear   . Chronic dermatitis of hands   . Eczema   . GERD (gastroesophageal reflux disease)    lost weight with bypass surgery, no problems since  . PCOS (polycystic ovarian syndrome)    Past Surgical History:  Procedure Laterality Date  . ADENOIDECTOMY    . DILATION AND CURETTAGE OF UTERUS    . DILATION AND EVACUATION N/A 04/01/2014   Procedure: DILATATION AND EVACUATION;  Surgeon: Robley Fries, MD;  Location: [facility];  Service: Gynecology;  Laterality: N/A;  . DILATION AND EVACUATION N/A 12/23/2017   Procedure: DILATATION AND EVACUATION;  Surgeon: Willodean Rosenthal, MD;  Location: [facility];  Service: Gynecology;  Laterality: N/A;  . GASTRIC BYPASS    . TONSILLECTOMY AND ADENOIDECTOMY  1999   Family History  Problem Relation Age of Onset  . Diabetes Mother   . Hyperlipidemia Mother   . Hypertension Mother   . Diabetes Father   . Kidney disease Father   . Hypertension Father   . Hyperlipidemia Father   . Cancer Father        pancreatic  . Gestational diabetes Sister   . Depression Sister   . Diabetes Sister   . Heart disease Maternal Grandmother   . Heart disease Paternal Grandmother    Social History   Tobacco Use  . Smoking status: Never Smoker  . Smokeless  tobacco: Never Used  Substance Use Topics  . Alcohol use: Not Currently    Frequency: Never  . Drug use: No   Allergies  Allergen Reactions  . Other     "anything antibacterial"; some creams for dermatitis  . Penicillins Itching and Rash   Current Outpatient Medications on File Prior to Visit  Medication Sig Dispense Refill  . CALCITRIOL PO Take by mouth.    . Cyanocobalamin (VITAMIN B-12 PO) Take by mouth.    . ferrous sulfate 325 (65 FE) MG tablet Take 325 mg by mouth daily with breakfast.    . Prenatal Vit-Fe Fumarate-FA (PRENATAL MULTIVITAMIN) TABS tablet Take 1 tablet by mouth daily at 12 noon.     No current facility-administered medications on file prior to visit.      Exam   Vitals:   07/21/18 0901  BP: 129/88  Pulse: (!) 59  Weight: 245 lb (111.1 kg)   Fetal Heart Rate (bpm): 177  Uterus:     Pelvic Exam: Perineum: no hemorrhoids, normal perineum   Vulva: normal external genitalia, no lesions   Vagina:  normal mucosa, normal discharge  Cervix: no lesions and normal, pap smear done.    Adnexa: normal adnexa and no mass, fullness, tenderness   Bony Pelvis: average  System: General: well-developed, well-nourished female in no acute distress   Breast:  normal appearance, no masses or tenderness   Skin: normal coloration and turgor, no rashes   Neurologic: oriented, normal, negative, normal mood   Extremities: normal strength, tone, and muscle mass, ROM of all joints is normal   HEENT PERRLA, extraocular movement intact and sclera clear, anicteric   Mouth/Teeth mucous membranes moist, pharynx normal without lesions and dental hygiene good   Neck supple and no masses   Cardiovascular: regular rate and rhythm   Respiratory:  no respiratory distress, normal breath sounds   Abdomen: soft, non-tender; bowel sounds normal; no masses,  no organomegaly     Assessment:   Pregnancy: G4P0030 Patient Active Problem List   Diagnosis Date Noted  . Supervision of high  risk pregnancy, antepartum 07/21/2018  . Chronic hypertension during pregnancy, antepartum 07/21/2018  . Previous gastric bypass affecting pregnancy, antepartum 07/21/2018  . 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 AMA (advanced maternal age) primigravida 35+ 07/02/2018  . Eczema of both hands 08/16/2014  . S/P gastric bypass 07/04/2014  . Oligomenorrhea 02/11/2012  . Essential hypertension 02/11/2012  . Primary female infertility 02/11/2012  . Obesity 02/11/2012     Plan:  1. Primigravida of advanced maternal age in first trimester Offered NIPT--genetics referral if abnl - Genetic Screening  2. Supervision of high risk pregnancy, antepartum New OB labs - [facility] AMB BABYSCRIPTS OPT IN - Culture, OB Urine - Cytology - PAP - Flu Vaccine QUAD 36+ mos IM - Obstetric Panel, Including HIV  3. Chronic hypertension during pregnancy, antepartum Baseline labs - Comprehensive metabolic panel - Protein / creatinine ratio, urine  4. Previous gastric bypass affecting pregnancy, antepartum Baseline labs + labs for micronutrient assessment - Hemoglobin A1c - B12 and Folate Panel - Vitamin D (25 hydroxy) - Vitamin B1 - Ferritin - Calcium - aspirin 81 MG chewable tablet; Chew 1 tablet (81 mg total) by mouth daily. Begin 10/7  Dispense: 90 tablet; Refill: 3  5. History of multiple miscarriages ? Related to age, obesity, will hold on w/u for now.  6. Trichomoniasis Currently on treatment, expedited partner therapy given - metroNIDAZOLE (FLAGYL) 500 MG tablet; Take 1 tablet (500 mg total) by mouth 2 (two) times daily.  Dispense: 14 tablet; Refill: 0   Initial labs drawn. Continue prenatal vitamins. Genetic Screening discussed, NIPS: ordered. Ultrasound discussed; fetal anatomic survey: discussed, to be scheduled at 18-19 wks. Problem list reviewed and updated.  Routine obstetric precautions reviewed. Return in 4 weeks (on 08/18/2018).

## 2018-07-22 LAB — OBSTETRIC PANEL, INCLUDING HIV
ANTIBODY SCREEN: NEGATIVE
Basophils Absolute: 0 10*3/uL (ref 0.0–0.2)
Basos: 0 %
EOS (ABSOLUTE): 0.2 10*3/uL (ref 0.0–0.4)
Eos: 2 %
HEP B S AG: NEGATIVE
HIV Screen 4th Generation wRfx: NONREACTIVE
Hematocrit: 36.5 % (ref 34.0–46.6)
Hemoglobin: 12.2 g/dL (ref 11.1–15.9)
IMMATURE GRANS (ABS): 0 10*3/uL (ref 0.0–0.1)
IMMATURE GRANULOCYTES: 0 %
LYMPHS: 22 %
Lymphocytes Absolute: 1.7 10*3/uL (ref 0.7–3.1)
MCH: 30 pg (ref 26.6–33.0)
MCHC: 33.4 g/dL (ref 31.5–35.7)
MCV: 90 fL (ref 79–97)
MONOS ABS: 0.7 10*3/uL (ref 0.1–0.9)
Monocytes: 10 %
NEUTROS PCT: 66 %
Neutrophils Absolute: 5 10*3/uL (ref 1.4–7.0)
Platelets: 245 10*3/uL (ref 150–450)
RBC: 4.06 x10E6/uL (ref 3.77–5.28)
RDW: 13.3 % (ref 12.3–15.4)
RH TYPE: POSITIVE
RPR Ser Ql: NONREACTIVE
Rubella Antibodies, IGG: 2.73 index (ref >0.99)
WBC: 7.6 10*3/uL (ref 3.4–10.8)

## 2018-07-22 LAB — PROTEIN / CREATININE RATIO, URINE: Creatinine, Urine: 18.6 mg/dL

## 2018-07-22 LAB — COMPREHENSIVE METABOLIC PANEL
A/G RATIO: 1.4 (ref 1.2–2.2)
ALT: 13 IU/L (ref 0–32)
AST: 25 IU/L (ref 0–40)
Albumin: 3.7 g/dL (ref 3.5–5.5)
Alkaline Phosphatase: 67 IU/L (ref 39–117)
BUN/Creatinine Ratio: 11 (ref 9–23)
BUN: 9 mg/dL (ref 6–20)
Bilirubin Total: 0.2 mg/dL (ref 0.0–1.2)
CALCIUM: 9.3 mg/dL (ref 8.7–10.2)
CO2: 21 mmol/L (ref 20–29)
Chloride: 99 mmol/L (ref 96–106)
Creatinine, Ser: 0.8 mg/dL (ref 0.57–1.00)
GFR, EST AFRICAN AMERICAN: 110 mL/min/{1.73_m2} (ref >59)
GFR, EST NON AFRICAN AMERICAN: 95 mL/min/{1.73_m2} (ref >59)
GLUCOSE: 91 mg/dL (ref 65–99)
Globulin, Total: 2.7 g/dL (ref 1.5–4.5)
Potassium: 4.4 mmol/L (ref 3.5–5.2)
Sodium: 133 mmol/L — ABNORMAL LOW (ref 134–144)
TOTAL PROTEIN: 6.4 g/dL (ref 6.0–8.5)

## 2018-07-22 LAB — CULTURE, OB URINE

## 2018-07-22 LAB — URINE CULTURE, OB REFLEX: ORGANISM ID, BACTERIA: NO GROWTH

## 2018-07-24 ENCOUNTER — Encounter: Payer: Self-pay | Admitting: Family Medicine

## 2018-07-24 LAB — CALCIUM: Calcium: 9.2 mg/dL (ref 8.7–10.2)

## 2018-07-24 LAB — HEMOGLOBIN A1C
ESTIMATED AVERAGE GLUCOSE: 114 mg/dL
Hgb A1c MFr Bld: 5.6 % (ref 4.8–5.6)

## 2018-07-24 LAB — FERRITIN: FERRITIN: 32 ng/mL (ref 15–150)

## 2018-07-24 LAB — B12 AND FOLATE PANEL
Folate: 17.7 ng/mL (ref >3.0)
VITAMIN B 12: 666 pg/mL (ref 232–1245)

## 2018-07-24 LAB — VITAMIN D 25 HYDROXY (VIT D DEFICIENCY, FRACTURES): Vit D, 25-Hydroxy: 15.5 ng/mL — ABNORMAL LOW (ref 30.0–100.0)

## 2018-07-24 LAB — VITAMIN B1: THIAMINE: 142.4 nmol/L (ref 66.5–200.0)

## 2018-07-25 ENCOUNTER — Encounter: Payer: Self-pay | Admitting: *Deleted

## 2018-07-25 DIAGNOSIS — E559 Vitamin D deficiency, unspecified: Secondary | ICD-10-CM | POA: Insufficient documentation

## 2018-07-25 LAB — CYTOLOGY - PAP
Adequacy: ABSENT
Chlamydia: NEGATIVE
Diagnosis: NEGATIVE
HPV (WINDOPATH): NOT DETECTED
NEISSERIA GONORRHEA: NEGATIVE

## 2018-07-25 MED ORDER — VITAMIN D (ERGOCALCIFEROL) 1.25 MG (50000 UNIT) PO CAPS: 50000.0000 [IU] | ORAL_CAPSULE | ORAL | 0 refills | Status: DC

## 2018-07-25 NOTE — Addendum Note (Signed)
Addended by: Reva Bores on: 07/25/2018 03:04 PM   Modules accepted: Orders

## 2018-07-26 ENCOUNTER — Encounter: Payer: Self-pay | Admitting: *Deleted

## 2018-08-03 ENCOUNTER — Encounter: Payer: Self-pay | Admitting: *Deleted

## 2018-08-18 ENCOUNTER — Ambulatory Visit (INDEPENDENT_AMBULATORY_CARE_PROVIDER_SITE_OTHER): Payer: Medicaid Other | Admitting: Obstetrics & Gynecology

## 2018-08-18 VITALS — BP 127/82 | HR 57 | Wt 251.3 lb

## 2018-08-18 DIAGNOSIS — O10919 Unspecified pre-existing hypertension complicating pregnancy, unspecified trimester: Secondary | ICD-10-CM

## 2018-08-18 DIAGNOSIS — O0990 Supervision of high risk pregnancy, unspecified, unspecified trimester: Secondary | ICD-10-CM

## 2018-08-18 DIAGNOSIS — O99840 Bariatric surgery status complicating pregnancy, unspecified trimester: Secondary | ICD-10-CM

## 2018-08-18 DIAGNOSIS — E559 Vitamin D deficiency, unspecified: Secondary | ICD-10-CM

## 2018-08-18 NOTE — Patient Instructions (Signed)
Return to clinic for any scheduled appointments or obstetric concerns, or go to [facility] for evaluation  Second Trimester of Pregnancy The second trimester is from week 13 through week 28, month 4 through 6. This is often the time in pregnancy that you feel your best. Often times, morning sickness has lessened or quit. You may have more energy, and you may get hungry more often. Your unborn baby (fetus) is growing rapidly. At the end of the sixth month, he or she is about 9 inches long and weighs about 1 pounds. You will likely feel the baby move (quickening) between 18 and 20 weeks of pregnancy.  Research childbirth classes and hospital preregistration at [email or web address]  Follow these instructions at home:  Avoid all smoking, herbs, and alcohol. Avoid drugs not approved by your doctor.  Do not use any tobacco products, including cigarettes, chewing tobacco, and electronic cigarettes. If you need help quitting, ask your doctor. You may get counseling or other support to help you quit.  Only take medicine as told by your doctor. Some medicines are safe and some are not during pregnancy.  Exercise only as told by your doctor. Stop exercising if you start having cramps.  Eat regular, healthy meals.  Wear a good support bra if your breasts are tender.  Do not use hot tubs, steam rooms, or saunas.  Wear your seat belt when driving.  Avoid raw meat, uncooked cheese, and liter boxes and soil used by cats.  Take your prenatal vitamins.  Take 1500-2000 milligrams of calcium daily starting at the 20th week of pregnancy until you deliver your baby.  Try taking medicine that helps you poop (stool softener) as needed, and if your doctor approves. Eat more fiber by eating fresh fruit, vegetables, and whole grains. Drink enough fluids to keep your pee (urine) clear or pale yellow.  Take warm water baths (sitz baths) to soothe pain or discomfort caused by hemorrhoids. Use hemorrhoid cream if your  doctor approves.  If you have puffy, bulging veins (varicose veins), wear support hose. Raise (elevate) your feet for 15 minutes, 3-4 times a day. Limit salt in your diet.  Avoid heavy lifting, wear low heals, and sit up straight.  Rest with your legs raised if you have leg cramps or low back pain.  Visit your dentist if you have not gone during your pregnancy. Use a soft toothbrush to brush your teeth. Be gentle when you floss.  You can have sex (intercourse) unless your doctor tells you not to.  Go to your doctor visits.  Get help if:  You feel dizzy.  You have mild cramps or pressure in your lower belly (abdomen).  You have a nagging pain in your belly area.  You continue to feel sick to your stomach (nauseous), throw up (vomit), or have watery poop (diarrhea).  You have bad smelling fluid coming from your vagina.  You have pain with peeing (urination). Get help right away if:  You have a fever.  You are leaking fluid from your vagina.  You have spotting or bleeding from your vagina.  You have severe belly cramping or pain.  You lose or gain weight rapidly.  You have trouble catching your breath and have chest pain.  You notice sudden or extreme puffiness (swelling) of your face, hands, ankles, feet, or legs.  You have not felt the baby move in over an hour.  You have severe headaches that do not go away with medicine.  You have vision changes.   This information is not intended to replace advice given to you by your health care provider. Make sure you discuss any questions you have with your health care provider. Document Released: 01/05/2010 Document Revised: 03/18/2016 Document Reviewed: 12/12/2012 Elsevier Interactive Patient Education  2017 Elsevier Inc.    

## 2018-08-18 NOTE — Progress Notes (Signed)
   PRENATAL VISIT NOTE  Subjective:  Vanessa Braun is a 37 y.o. G4P0030 at [redacted]w[redacted]d being seen today for ongoing prenatal care.  She is currently monitored for the following issues for this high-risk pregnancy and has Oligomenorrhea; Essential hypertension; Primary female infertility; Obesity; S/P gastric bypass; Eczema of both hands; AMA (advanced maternal age) primigravida 35+; Supervision of high risk pregnancy, antepartum; Chronic hypertension during pregnancy, antepartum; Previous gastric bypass affecting pregnancy, antepartum; History of multiple miscarriages; and Vitamin D deficiency on their problem list.  Patient reports no complaints.  Contractions: Not present. Vag. Bleeding: None.  Movement: Absent. Denies leaking of fluid.   The following portions of the patient's history were reviewed and updated as appropriate: allergies, current medications, past family history, past medical history, past social history, past surgical history and problem list. Problem list updated.  Objective:   Vitals:   08/18/18 0951  BP: 127/82  Pulse: (!) 57  Weight: 251 lb 4.8 oz (114 kg)    Fetal Status: Fetal Heart Rate (bpm): 157   Movement: Absent     General:  Alert, oriented and cooperative. Patient is in no acute distress.  Skin: Skin is warm and dry. No rash noted.   Cardiovascular: Normal heart rate noted  Respiratory: Normal respiratory effort, no problems with respiration noted  Abdomen: Soft, gravid, appropriate for gestational age.  Pain/Pressure: Absent     Pelvic: Cervical exam deferred        Extremities: Normal range of motion.  Edema: None  Mental Status: Normal mood and affect. Normal behavior. Normal judgment and thought content.   Assessment and Plan:  Pregnancy: G4P0030 at [redacted]w[redacted]d  1. Chronic hypertension during pregnancy, antepartum Stable BP.   2. Previous gastric bypass affecting pregnancy, antepartum 3. Vitamin D deficiency On vitamin supplements. Will do serial  growth scans.  4. Supervision of high risk pregnancy, antepartum Low risk NIPS. Horizon showed SNM1 copy of 1, FOB to be tested next visit. Declined genetic counseling. Anatomy scan scheduled. AFP only screen to be done next visit.  - [place] MFM OB DETAIL +14 WK; Future No other complaints or concerns.  Routine obstetric precautions reviewed. Please refer to After Visit Summary for other counseling recommendations.  Return in about 4 weeks (around 09/15/2018) for OB Visit (HOB).  Future Appointments  Date Time Provider Department Center  09/15/2018 10:30 AM [facility] [place] [facility]    Jaynie Collins, MD

## 2018-08-20 ENCOUNTER — Inpatient Hospital Stay (HOSPITAL_COMMUNITY)
Admission: AD | Admit: 2018-08-20 | Discharge: 2018-08-20 | Disposition: A | Discharge status: Home / Self Care | Payer: Medicaid Other | Source: Ambulatory Visit | Attending: Obstetrics & Gynecology | Admitting: Obstetrics & Gynecology

## 2018-08-20 ENCOUNTER — Encounter (HOSPITAL_COMMUNITY): Payer: Self-pay

## 2018-08-20 ENCOUNTER — Other Ambulatory Visit: Payer: Self-pay

## 2018-08-20 DIAGNOSIS — O09522 Supervision of elderly multigravida, second trimester: Secondary | ICD-10-CM | POA: Insufficient documentation

## 2018-08-20 DIAGNOSIS — Z3A15 15 weeks gestation of pregnancy: Secondary | ICD-10-CM

## 2018-08-20 DIAGNOSIS — Z833 Family history of diabetes mellitus: Secondary | ICD-10-CM | POA: Insufficient documentation

## 2018-08-20 DIAGNOSIS — O99612 Diseases of the digestive system complicating pregnancy, second trimester: Secondary | ICD-10-CM | POA: Insufficient documentation

## 2018-08-20 DIAGNOSIS — Z818 Family history of other mental and behavioral disorders: Secondary | ICD-10-CM | POA: Diagnosis not present

## 2018-08-20 DIAGNOSIS — O26892 Other specified pregnancy related conditions, second trimester: Secondary | ICD-10-CM

## 2018-08-20 DIAGNOSIS — O99842 Bariatric surgery status complicating pregnancy, second trimester: Secondary | ICD-10-CM | POA: Diagnosis not present

## 2018-08-20 DIAGNOSIS — Z8249 Family history of ischemic heart disease and other diseases of the circulatory system: Secondary | ICD-10-CM | POA: Diagnosis not present

## 2018-08-20 DIAGNOSIS — Z841 Family history of disorders of kidney and ureter: Secondary | ICD-10-CM | POA: Insufficient documentation

## 2018-08-20 DIAGNOSIS — R197 Diarrhea, unspecified: Secondary | ICD-10-CM | POA: Diagnosis present

## 2018-08-20 DIAGNOSIS — A084 Viral intestinal infection, unspecified: Secondary | ICD-10-CM

## 2018-08-20 DIAGNOSIS — Z880 Allergy status to penicillin: Secondary | ICD-10-CM | POA: Diagnosis not present

## 2018-08-20 DIAGNOSIS — R109 Unspecified abdominal pain: Secondary | ICD-10-CM

## 2018-08-20 LAB — LIPASE, BLOOD: Lipase: 34 U/L (ref 11–51)

## 2018-08-20 LAB — COMPREHENSIVE METABOLIC PANEL
ALT: 13 U/L (ref 0–44)
ANION GAP: 7 (ref 5–15)
AST: 17 U/L (ref 15–41)
Albumin: 3.1 g/dL — ABNORMAL LOW (ref 3.5–5.0)
Alkaline Phosphatase: 59 U/L (ref 38–126)
BILIRUBIN TOTAL: 0.5 mg/dL (ref 0.3–1.2)
BUN: 8 mg/dL (ref 6–20)
CALCIUM: 8.6 mg/dL — AB (ref 8.9–10.3)
CO2: 21 mmol/L — ABNORMAL LOW (ref 22–32)
Chloride: 105 mmol/L (ref 98–111)
Creatinine, Ser: 0.7 mg/dL (ref 0.44–1.00)
Glucose, Bld: 90 mg/dL (ref 70–99)
Potassium: 3.8 mmol/L (ref 3.5–5.1)
Sodium: 133 mmol/L — ABNORMAL LOW (ref 135–145)
TOTAL PROTEIN: 6.1 g/dL — AB (ref 6.5–8.1)

## 2018-08-20 LAB — URINALYSIS, ROUTINE W REFLEX MICROSCOPIC
Bilirubin Urine: NEGATIVE
Glucose, UA: NEGATIVE mg/dL
Hgb urine dipstick: NEGATIVE
Ketones, ur: 5 mg/dL — AB
LEUKOCYTES UA: NEGATIVE
NITRITE: NEGATIVE
PH: 6 (ref 5.0–8.0)
Protein, ur: NEGATIVE mg/dL
SPECIFIC GRAVITY, URINE: 1.015 (ref 1.005–1.030)

## 2018-08-20 LAB — CBC
HEMATOCRIT: 34.3 % — AB (ref 36.0–46.0)
HEMOGLOBIN: 11.5 g/dL — AB (ref 12.0–15.0)
MCH: 30.3 pg (ref 26.0–34.0)
MCHC: 33.5 g/dL (ref 30.0–36.0)
MCV: 90.5 fL (ref 80.0–100.0)
NRBC: 0 % (ref 0.0–0.2)
Platelets: 162 10*3/uL (ref 150–400)
RBC: 3.79 MIL/uL — AB (ref 3.87–5.11)
RDW: 12.6 % (ref 11.5–15.5)
WBC: 4.8 10*3/uL (ref 4.0–10.5)

## 2018-08-20 MED ORDER — DICYCLOMINE HCL 10 MG PO CAPS: 10.0000 mg | ORAL_CAPSULE | Freq: Three times a day (TID) | ORAL | 0 refills | Status: DC | PRN

## 2018-08-20 NOTE — Discharge Instructions (Signed)
° °Food Choices to Help Relieve Diarrhea, Adult °When you have diarrhea, the foods you eat and your eating habits are very important. Choosing the right foods and drinks can help: °· Relieve diarrhea. °· Replace lost fluids and nutrients. °· Prevent dehydration. ° °What general guidelines should I follow? °Relieving diarrhea °· Choose foods with less than 2 g or .07 oz. of fiber per serving. °· Limit fats to less than 8 tsp (38 g or 1.34 oz.) a day. °· Avoid the following: °? Foods and beverages sweetened with high-fructose corn syrup, honey, or sugar alcohols such as xylitol, sorbitol, and mannitol. °? Foods that contain a lot of fat or sugar. °? Fried, greasy, or spicy foods. °? High-fiber grains, breads, and cereals. °? Raw fruits and vegetables. °· Eat foods that are rich in probiotics. These foods include dairy products such as yogurt and fermented milk products. They help increase healthy bacteria in the stomach and intestines (gastrointestinal tract, or GI tract). °· If you have lactose intolerance, avoid dairy products. These may make your diarrhea worse. °· Take medicine to help stop diarrhea (antidiarrheal medicine) only as told by your health care provider. °Replacing nutrients °· Eat small meals or snacks every 3-4 hours. °· Eat bland foods, such as white rice, toast, or baked potato, until your diarrhea starts to get better. Gradually reintroduce nutrient-rich foods as tolerated or as told by your health care provider. This includes: °? Well-cooked protein foods. °? Peeled, seeded, and soft-cooked fruits and vegetables. °? Low-fat dairy products. °· Take vitamin and mineral supplements as told by your health care provider. °Preventing dehydration ° °· Start by sipping water or a special solution to prevent dehydration (oral rehydration solution, ORS). Urine that is clear or pale yellow means that you are getting enough fluid. °· Try to drink at least 8-10 cups of fluid each day to help replace lost  fluids. °· You may add other liquids in addition to water, such as clear juice or decaffeinated sports drinks, as tolerated or as told by your health care provider. °· Avoid drinks with caffeine, such as coffee, tea, or soft drinks. °· Avoid alcohol. °What foods are recommended? °The items listed may not be a complete list. Talk with your health care provider about what dietary choices are best for you. °Grains °White rice. White, French, or pita breads (fresh or toasted), including plain rolls, buns, or bagels. White pasta. Saltine, soda, or graham crackers. Pretzels. Low-fiber cereal. Cooked cereals made with water (such as cornmeal, farina, or cream cereals). Plain muffins. Matzo. Melba toast. Zwieback. °Vegetables °Potatoes (without the skin). Most well-cooked and canned vegetables without skins or seeds. Tender lettuce. °Fruits °Apple sauce. Fruits canned in juice. Cooked apricots, cherries, grapefruit, peaches, pears, or plums. Fresh bananas and cantaloupe. °Meats and other protein foods °Baked or boiled chicken. Eggs. Tofu. Fish. Seafood. Smooth nut butters. Ground or well-cooked tender beef, ham, veal, lamb, pork, or poultry. °Dairy °Plain yogurt, kefir, and unsweetened liquid yogurt. Lactose-free milk, buttermilk, skim milk, or soy milk. Low-fat or nonfat hard cheese. °Beverages °Water. Low-calorie sports drinks. Fruit juices without pulp. Strained tomato and vegetable juices. Decaffeinated teas. Sugar-free beverages not sweetened with sugar alcohols. Oral rehydration solutions, if approved by your health care provider. °Seasoning and other foods °Bouillon, broth, or soups made from recommended foods. °What foods are not recommended? °The items listed may not be a complete list. Talk with your health care provider about what dietary choices are best for you. °Grains °Whole grain, whole   wheat, bran, or rye breads, rolls, pastas, and crackers. Wild or brown rice. Whole grain or bran cereals. Barley. Oats and  oatmeal. Corn tortillas or taco shells. Granola. Popcorn. °Vegetables °Raw vegetables. Fried vegetables. Cabbage, broccoli, Brussels sprouts, artichokes, baked beans, beet greens, corn, kale, legumes, peas, sweet potatoes, and yams. Potato skins. Cooked spinach and cabbage. °Fruits °Dried fruit, including raisins and dates. Raw fruits. Stewed or dried prunes. Canned fruits with syrup. °Meat and other protein foods °Fried or fatty meats. Deli meats. Chunky nut butters. Nuts and seeds. Beans and lentils. Bacon. Hot dogs. Sausage. °Dairy °High-fat cheeses. Whole milk, chocolate milk, and beverages made with milk, such as milk shakes. Half-and-half. Cream. sour cream. Ice cream. °Beverages °Caffeinated beverages (such as coffee, tea, soda, or energy drinks). Alcoholic beverages. Fruit juices with pulp. Prune juice. Soft drinks sweetened with high-fructose corn syrup or sugar alcohols. High-calorie sports drinks. °Fats and oils °Butter. Cream sauces. Margarine. Salad oils. Plain salad dressings. Olives. Avocados. Mayonnaise. °Sweets and desserts °Sweet rolls, doughnuts, and sweet breads. Sugar-free desserts sweetened with sugar alcohols such as xylitol and sorbitol. °Seasoning and other foods °Honey. Hot sauce. Chili powder. Gravy. Cream-based or milk-based soups. Pancakes and waffles. °Summary °· When you have diarrhea, the foods you eat and your eating habits are very important. °· Make sure you get at least 8-10 cups of fluid each day, or enough to keep your urine clear or pale yellow. °· Eat bland foods and gradually reintroduce healthy, nutrient-rich foods as tolerated, or as told by your health care provider. °· Avoid high-fiber, fried, greasy, or spicy foods. °This information is not intended to replace advice given to you by your health care provider. Make sure you discuss any questions you have with your health care provider. °Document Released: 01/01/2004 Document Revised: 10/08/2016 Document Reviewed:  10/08/2016 °Elsevier Interactive Patient Education © 2018 [employer] Inc[employer] °Viral Gastroenteritis, Adult °Viral gastroenteritis is also known as the stomach flu. This condition is caused by certain germs (viruses). These germs can be passed from person to person very easily (are very contagious). This condition can cause sudden watery poop (diarrhea), fever, and throwing up (vomiting). °Having watery poop and throwing up can make you feel weak and cause you to get dehydrated. Dehydration can make you tired and thirsty, make you have a dry mouth, and make it so you pee (urinate) less often. Older adults and people with other diseases or a weak defense system (immune system) are at higher risk for dehydration. It is important to replace the fluids that you lose from having watery poop and throwing up. °Follow these instructions at home: °Follow instructions from your doctor about how to care for yourself at home. °Eating and drinking ° °Follow these instructions as told by your doctor: °· Take an oral rehydration solution (ORS). This is a drink that is sold at pharmacies and stores. °· Drink clear fluids in small amounts as you are able, such as: °? Water. °? Ice chips. °? Diluted fruit juice. °? Low-calorie sports drinks. °· Eat bland, easy-to-digest foods in small amounts as you are able, such as: °? Bananas. °? Applesauce. °? Rice. °? Low-fat (lean) meats. °? Toast. °? Crackers. °· Avoid fluids that have a lot of sugar or caffeine in them. °· Avoid alcohol. °· Avoid spicy or fatty foods. ° °General instructions °· Drink enough fluid to keep your pee (urine) clear or pale yellow. °· Wash your hands often. If you cannot use soap and water, use hand sanitizer. °· Make   sure that all people in your home wash their hands well and often. °· Rest at home while you get better. °· Take over-the-counter and prescription medicines only as told by your doctor. °· Watch your condition for any changes. °· Take a warm bath to help  with any burning or pain from having watery poop. °· Keep all follow-up visits as told by your doctor. This is important. °Contact a doctor if: °· You cannot keep fluids down. °· Your symptoms get worse. °· You have new symptoms. °· You feel light-headed or dizzy. °· You have muscle cramps. °Get help right away if: °· You have chest pain. °· You feel very weak or you pass out (faint). °· You see blood in your throw-up. °· Your throw-up looks like coffee grounds. °· You have bloody or black poop (stools) or poop that look like tar. °· You have a very bad headache, a stiff neck, or both. °· You have a rash. °· You have very bad pain, cramping, or bloating in your belly (abdomen). °· You have trouble breathing. °· You are breathing very quickly. °· Your heart is beating very quickly. °· Your skin feels cold and clammy. °· You feel confused. °· You have pain when you pee. °· You have signs of dehydration, such as: °? Dark pee, hardly any pee, or no pee. °? Cracked lips. °? Dry mouth. °? Sunken eyes. °? Sleepiness. °? Weakness. °This information is not intended to replace advice given to you by your health care provider. Make sure you discuss any questions you have with your health care provider. °Document Released: 03/29/2008 Document Revised: 04/30/2016 Document Reviewed: 06/17/2015 °Elsevier Interactive Patient Education © 2017 [employer] Inc. ° °

## 2018-08-20 NOTE — [facility] Note (Signed)
Pt states diarrhea started on Friday.  Pt states she has had about 5 episodes of diarrhea today.  Pt denies nausea or vomiting.  Pt reports upper abd. Pain 3/10.  Pt denies vag bleeding or dc

## 2018-08-20 NOTE — MAU Provider Note (Signed)
History     CSN: [id number]  Arrival date and time: 08/20/18 1831   First Provider Initiated Contact with Patient 08/20/18 1911      Chief Complaint  Patient presents with  . Abdominal Pain  . Diarrhea   G4P0030 @15 .0 wks here with diarrhea. Had loose stool 2 days ago then became watery diarrhea yesterday. No blood present. Partner had same sx. No fevers. No N/V. She has use Imodium twice but hasn't helped. She is eating and drinking w/o problem. She reports intermittent bilateral upper abdominal pain. Describes as twisting.    OB History    Gravida  4   Para  0   Term  0   Preterm  0   AB  3   Living  0     SAB  3   TAB  0   Ectopic  0   Multiple  0   Live Births  0           Past Medical History:  Diagnosis Date  . Abnormal Pap smear   . Chronic dermatitis of hands   . Eczema   . GERD (gastroesophageal reflux disease)    lost weight with bypass surgery, no problems since  . PCOS (polycystic ovarian syndrome)     Past Surgical History:  Procedure Laterality Date  . ADENOIDECTOMY    . DILATION AND CURETTAGE OF UTERUS    . DILATION AND EVACUATION N/A 04/01/2014   Procedure: DILATATION AND EVACUATION;  Surgeon: Robley Fries, MD;  Location: [facility];  Service: Gynecology;  Laterality: N/A;  . DILATION AND EVACUATION N/A 12/23/2017   Procedure: DILATATION AND EVACUATION;  Surgeon: Willodean Rosenthal, MD;  Location: [facility];  Service: Gynecology;  Laterality: N/A;  . GASTRIC BYPASS    . TONSILLECTOMY AND ADENOIDECTOMY  1999    Family History  Problem Relation Age of Onset  . Diabetes Mother   . Hyperlipidemia Mother   . Hypertension Mother   . Diabetes Father   . Kidney disease Father   . Hypertension Father   . Hyperlipidemia Father   . Cancer Father        pancreatic  . Gestational diabetes Sister   . Depression Sister   . Diabetes Sister   . Heart disease Maternal Grandmother   . Heart disease Paternal Grandmother     Social  History   Tobacco Use  . Smoking status: Never Smoker  . Smokeless tobacco: Never Used  Substance Use Topics  . Alcohol use: Not Currently    Frequency: Never  . Drug use: No    Allergies:  Allergies  Allergen Reactions  . Other     "anything antibacterial"; some creams for dermatitis  . Penicillins Itching and Rash    Medications Prior to Admission  Medication Sig Dispense Refill Last Dose  . CALCITRIOL PO Take by mouth.   Past Week at Unknown time  . Cyanocobalamin (VITAMIN B-12 PO) Take by mouth.   08/19/2018 at Unknown time  . ferrous sulfate 325 (65 FE) MG tablet Take 325 mg by mouth daily with breakfast.   Past Week at Unknown time  . Prenatal Vit-Fe Fumarate-FA (PRENATAL MULTIVITAMIN) TABS tablet Take 1 tablet by mouth daily at 12 noon.   08/19/2018 at Unknown time  . Vitamin D, Ergocalciferol, (DRISDOL) 50000 units CAPS capsule Take 1 capsule (50,000 Units total) by mouth every 7 (seven) days. 8 capsule 0 Past Week at Unknown time    Review of Systems  Constitutional:  Negative for fever.  Gastrointestinal: Positive for abdominal pain and diarrhea. Negative for constipation, nausea and vomiting.  Genitourinary: Negative for vaginal bleeding.   Physical Exam   Blood pressure 140/79, pulse 69, temperature 98 F (36.7 C), temperature source Oral, resp. rate 18, height 5\' 9"  (1.753 m), weight 112.5 kg, last menstrual period 05/07/2018, SpO2 99 %, unknown if currently breastfeeding.  Physical Exam  Constitutional: She is oriented to person, place, and time. She appears well-developed and well-nourished. No distress.  HENT:  Head: Normocephalic and atraumatic.  Neck: Normal range of motion.  Cardiovascular: Normal rate.  Respiratory: Effort normal. No respiratory distress.  GI: Soft. She exhibits no distension and no mass. There is no tenderness. There is no rebound and no guarding.  Musculoskeletal: Normal range of motion.  Neurological: She is alert and oriented to  person, place, and time.  Skin: Skin is warm and dry.  Psychiatric: She has a normal mood and affect.  FHT 158  Results for orders placed or performed during the hospital encounter of 08/20/18 (from the past 24 hour(s))  Urinalysis, Routine w reflex microscopic     Status: Abnormal   Collection Time: 08/20/18  7:06 PM  Result Value Ref Range   Color, Urine YELLOW YELLOW   APPearance CLEAR CLEAR   Specific Gravity, Urine 1.015 1.005 - 1.030   pH 6.0 5.0 - 8.0   Glucose, UA NEGATIVE NEGATIVE mg/dL   Hgb urine dipstick NEGATIVE NEGATIVE   Bilirubin Urine NEGATIVE NEGATIVE   Ketones, ur 5 (A) NEGATIVE mg/dL   Protein, ur NEGATIVE NEGATIVE mg/dL   Nitrite NEGATIVE NEGATIVE   Leukocytes, UA NEGATIVE NEGATIVE  CBC     Status: Abnormal   Collection Time: 08/20/18  7:22 PM  Result Value Ref Range   WBC 4.8 4.0 - 10.5 K/uL   RBC 3.79 (L) 3.87 - 5.11 MIL/uL   Hemoglobin 11.5 (L) 12.0 - 15.0 g/dL   HCT 16.1 (L) 09.6 - 04.5 %   MCV 90.5 80.0 - 100.0 fL   MCH 30.3 26.0 - 34.0 pg   MCHC 33.5 30.0 - 36.0 g/dL   RDW 40.9 81.1 - 91.4 %   Platelets 162 150 - 400 K/uL   nRBC 0.0 0.0 - 0.2 %  Comprehensive metabolic panel     Status: Abnormal   Collection Time: 08/20/18  7:22 PM  Result Value Ref Range   Sodium 133 (L) 135 - 145 mmol/L   Potassium 3.8 3.5 - 5.1 mmol/L   Chloride 105 98 - 111 mmol/L   CO2 21 (L) 22 - 32 mmol/L   Glucose, Bld 90 70 - 99 mg/dL   BUN 8 6 - 20 mg/dL   Creatinine, Ser 7.82 0.44 - 1.00 mg/dL   Calcium 8.6 (L) 8.9 - 10.3 mg/dL   Total Protein 6.1 (L) 6.5 - 8.1 g/dL   Albumin 3.1 (L) 3.5 - 5.0 g/dL   AST 17 15 - 41 U/L   ALT 13 0 - 44 U/L   Alkaline Phosphatase 59 38 - 126 U/L   Total Bilirubin 0.5 0.3 - 1.2 mg/dL   GFR calc non Af Amer >60 >60 mL/min   GFR calc Af Amer >60 >60 mL/min   Anion gap 7 5 - 15  Lipase, blood     Status: None   Collection Time: 08/20/18  7:22 PM  Result Value Ref Range   Lipase 34 11 - 51 U/L   MAU Course   Procedures  MDM Labs ordered  and reviewed. No dehydration. Likely viral GI process. Discussed supportive measures. Hydrate to avoid dehydration. Stable for discharge home.   Assessment and Plan   1. [redacted] weeks gestation of pregnancy   2. Viral gastroenteritis    Discharge home Continue Imodium 4 times day Rx Bentyl prn Hydrate Rest BRAT diet Notify MD if sx persist beyond tomorrow  Allergies as of 08/20/2018      Reactions   Other    "anything antibacterial"; some creams for dermatitis   Penicillins Itching, Rash      Medication List    TAKE these medications   CALCITRIOL PO Take by mouth.   dicyclomine 10 MG capsule Commonly known as:  BENTYL Take 1 capsule (10 mg total) by mouth 3 (three) times daily as needed for spasms.   ferrous sulfate 325 (65 FE) MG tablet Take 325 mg by mouth daily with breakfast.   prenatal multivitamin Tabs tablet Take 1 tablet by mouth daily at 12 noon.   VITAMIN B-12 PO Take by mouth.   Vitamin D (Ergocalciferol) 50000 units Caps capsule Commonly known as:  DRISDOL Take 1 capsule (50,000 Units total) by mouth every 7 (seven) days.      Donette Larry, CNM 08/20/2018, 8:05 PM

## 2018-09-08 ENCOUNTER — Encounter (HOSPITAL_COMMUNITY): Payer: Self-pay

## 2018-09-15 ENCOUNTER — Ambulatory Visit (INDEPENDENT_AMBULATORY_CARE_PROVIDER_SITE_OTHER): Payer: Medicaid Other | Admitting: Obstetrics and Gynecology

## 2018-09-15 ENCOUNTER — Encounter (HOSPITAL_COMMUNITY): Payer: Self-pay

## 2018-09-15 ENCOUNTER — Ambulatory Visit (HOSPITAL_COMMUNITY)
Admission: RE | Admit: 2018-09-15 | Discharge: 2018-09-15 | Disposition: A | Discharge status: Home / Self Care | Payer: Medicaid Other | Source: Ambulatory Visit | Attending: Obstetrics & Gynecology | Admitting: Obstetrics & Gynecology

## 2018-09-15 ENCOUNTER — Other Ambulatory Visit (HOSPITAL_COMMUNITY): Payer: Self-pay | Admitting: *Deleted

## 2018-09-15 VITALS — BP 127/71 | HR 61 | Wt 243.3 lb

## 2018-09-15 DIAGNOSIS — Z9884 Bariatric surgery status: Secondary | ICD-10-CM

## 2018-09-15 DIAGNOSIS — O10012 Pre-existing essential hypertension complicating pregnancy, second trimester: Secondary | ICD-10-CM

## 2018-09-15 DIAGNOSIS — Z363 Encounter for antenatal screening for malformations: Secondary | ICD-10-CM | POA: Insufficient documentation

## 2018-09-15 DIAGNOSIS — O99842 Bariatric surgery status complicating pregnancy, second trimester: Secondary | ICD-10-CM | POA: Diagnosis not present

## 2018-09-15 DIAGNOSIS — O0992 Supervision of high risk pregnancy, unspecified, second trimester: Secondary | ICD-10-CM | POA: Insufficient documentation

## 2018-09-15 DIAGNOSIS — Z3A18 18 weeks gestation of pregnancy: Secondary | ICD-10-CM

## 2018-09-15 DIAGNOSIS — O99212 Obesity complicating pregnancy, second trimester: Secondary | ICD-10-CM | POA: Diagnosis not present

## 2018-09-15 DIAGNOSIS — O10912 Unspecified pre-existing hypertension complicating pregnancy, second trimester: Secondary | ICD-10-CM | POA: Diagnosis not present

## 2018-09-15 DIAGNOSIS — O0990 Supervision of high risk pregnancy, unspecified, unspecified trimester: Secondary | ICD-10-CM

## 2018-09-15 DIAGNOSIS — O09513 Supervision of elderly primigravida, third trimester: Secondary | ICD-10-CM

## 2018-09-15 DIAGNOSIS — O09522 Supervision of elderly multigravida, second trimester: Secondary | ICD-10-CM

## 2018-09-15 DIAGNOSIS — O10919 Unspecified pre-existing hypertension complicating pregnancy, unspecified trimester: Secondary | ICD-10-CM

## 2018-09-15 DIAGNOSIS — O09529 Supervision of elderly multigravida, unspecified trimester: Secondary | ICD-10-CM

## 2018-09-15 DIAGNOSIS — O09512 Supervision of elderly primigravida, second trimester: Secondary | ICD-10-CM

## 2018-09-15 NOTE — Progress Notes (Signed)
Prenatal Visit Note Date: 09/15/2018 Clinic: [facility]  Subjective:  Vanessa Braun is a 37 y.o. G4P0030 at 6656w5d being seen today for ongoing prenatal care.  She is currently monitored for the following issues for this low-risk pregnancy and has Oligomenorrhea; Essential hypertension; Primary female infertility; Obesity; S/P gastric bypass; Eczema of both hands; AMA (advanced maternal age) primigravida 35+; Supervision of high risk pregnancy, antepartum; Chronic hypertension during pregnancy, antepartum; Previous gastric bypass affecting pregnancy, antepartum; History of multiple miscarriages; and Vitamin D deficiency on their problem list.  Patient reports no complaints.   Contractions: Not present. Vag. Bleeding: None.  Movement: Absent. Denies leaking of fluid.   The following portions of the patient's history were reviewed and updated as appropriate: allergies, current medications, past family history, past medical history, past social history, past surgical history and problem list. Problem list updated.  Objective:   Vitals:   09/15/18 0932  BP: 127/71  Pulse: 61  Weight: 243 lb 4.8 oz (110.4 kg)    Fetal Status: Fetal Heart Rate (bpm): 152   Movement: Absent     General:  Alert, oriented and cooperative. Patient is in no acute distress.  Skin: Skin is warm and dry. No rash noted.   Cardiovascular: Normal heart rate noted  Respiratory: Normal respiratory effort, no problems with respiration noted  Abdomen: Soft, gravid, appropriate for gestational age. Pain/Pressure: Absent     Pelvic:  Cervical exam deferred        Extremities: Normal range of motion.  Edema: None  Mental Status: Normal mood and affect. Normal behavior. Normal judgment and thought content.   Urinalysis:      Assessment and Plan:  Pregnancy: G4P0030 at 5056w5d  1. Supervision of high risk pregnancy, antepartum Routine care. Anatomy u/s today. FOB considering getting SMA  testing. Pt previously declined GC.  - AFP, Serum, Open Spina Bifida - TSH  2. Chronic hypertension during pregnancy, antepartum Can't do aspirin due to gastric bypass. Baseline tsh today - TSH  3. S/P gastric bypass Serial growth scans  4. Primigravida of advanced maternal age in third trimester  Preterm labor symptoms and general obstetric precautions including but not limited to vaginal bleeding, contractions, leaking of fluid and fetal movement were reviewed in detail with the patient. Please refer to After Visit Summary for other counseling recommendations.  Return in about 3 weeks (around 10/06/2018) for hrob.   Rattan BingPickens, Vanessa Papa, MD

## 2018-09-17 LAB — TSH: TSH: 3.39 u[IU]/mL (ref 0.450–4.500)

## 2018-09-17 LAB — AFP, SERUM, OPEN SPINA BIFIDA
AFP MoM: 1.61
AFP Value: 63.2 ng/mL
Gest. Age on Collection Date: 18.7 weeks
Maternal Age At EDD: 36.8 yr
OSBR Risk 1 IN: 4093
TEST RESULTS AFP: NEGATIVE
Weight: 243 [lb_av]

## 2018-10-06 ENCOUNTER — Ambulatory Visit (INDEPENDENT_AMBULATORY_CARE_PROVIDER_SITE_OTHER): Payer: Medicaid Other | Admitting: Obstetrics & Gynecology

## 2018-10-06 VITALS — BP 124/72 | HR 71 | Wt 244.9 lb

## 2018-10-06 DIAGNOSIS — Z6836 Body mass index (BMI) 36.0-36.9, adult: Secondary | ICD-10-CM

## 2018-10-06 DIAGNOSIS — O0990 Supervision of high risk pregnancy, unspecified, unspecified trimester: Secondary | ICD-10-CM

## 2018-10-06 DIAGNOSIS — E559 Vitamin D deficiency, unspecified: Secondary | ICD-10-CM

## 2018-10-06 DIAGNOSIS — O0992 Supervision of high risk pregnancy, unspecified, second trimester: Secondary | ICD-10-CM

## 2018-10-06 DIAGNOSIS — O09512 Supervision of elderly primigravida, second trimester: Secondary | ICD-10-CM

## 2018-10-06 DIAGNOSIS — O09513 Supervision of elderly primigravida, third trimester: Secondary | ICD-10-CM

## 2018-10-06 DIAGNOSIS — O99842 Bariatric surgery status complicating pregnancy, second trimester: Secondary | ICD-10-CM

## 2018-10-06 DIAGNOSIS — O10919 Unspecified pre-existing hypertension complicating pregnancy, unspecified trimester: Secondary | ICD-10-CM

## 2018-10-06 DIAGNOSIS — O99840 Bariatric surgery status complicating pregnancy, unspecified trimester: Secondary | ICD-10-CM

## 2018-10-06 DIAGNOSIS — O10912 Unspecified pre-existing hypertension complicating pregnancy, second trimester: Secondary | ICD-10-CM

## 2018-10-06 LAB — POCT URINALYSIS DIP (DEVICE)
GLUCOSE, UA: NEGATIVE mg/dL
Hgb urine dipstick: NEGATIVE
KETONES UR: NEGATIVE mg/dL
LEUKOCYTES UA: NEGATIVE
Nitrite: NEGATIVE
Protein, ur: 30 mg/dL — AB
Specific Gravity, Urine: 1.025 (ref 1.005–1.030)
Urobilinogen, UA: 1 mg/dL (ref 0.0–1.0)
pH: 6.5 (ref 5.0–8.0)

## 2018-10-06 NOTE — Addendum Note (Signed)
Addended by: Allie BossierVE, Kamry Faraci C on: 10/06/2018 10:09 AM   Modules accepted: Orders

## 2018-10-06 NOTE — Progress Notes (Signed)
   PRENATAL VISIT NOTE  Subjective:  Vanessa Braun is a 37 y.o. G4P0030 at 231w5d being seen today for ongoing prenatal care.  She is currently monitored for the following issues for this high-risk pregnancy and has Oligomenorrhea; Essential hypertension; Primary female infertility; Obesity; S/P gastric bypass; Eczema of both hands; AMA (advanced maternal age) primigravida 35+; Supervision of high risk pregnancy, antepartum; Chronic hypertension during pregnancy, antepartum; Previous gastric bypass affecting pregnancy, antepartum; History of multiple miscarriages; and Vitamin D deficiency on their problem list.  Patient reports no complaints.  Contractions: Not present. Vag. Bleeding: None.  Movement: Present. Denies leaking of fluid.   The following portions of the patient's history were reviewed and updated as appropriate: allergies, current medications, past family history, past medical history, past social history, past surgical history and problem list. Problem list updated.  Objective:   Vitals:   10/06/18 0956  BP: 124/72  Pulse: 71  Weight: 244 lb 14.4 oz (111.1 kg)    Fetal Status: Fetal Heart Rate (bpm): 156   Movement: Present     General:  Alert, oriented and cooperative. Patient is in no acute distress.  Skin: Skin is warm and dry. No rash noted.   Cardiovascular: Normal heart rate noted  Respiratory: Normal respiratory effort, no problems with respiration noted  Abdomen: Soft, gravid, appropriate for gestational age.  Pain/Pressure: Absent     Pelvic: Cervical exam deferred        Extremities: Normal range of motion.  Edema: None  Mental Status: Normal mood and affect. Normal behavior. Normal judgment and thought content.   Assessment and Plan:  Pregnancy: G4P0030 at [redacted]w[redacted]d  1. Primigravida of advanced maternal age in third trimester - low risk NIPS, female  2. Chronic hypertension during pregnancy, antepartum - not on asa due to bypass, This diagnosis was  prior to her bypass so I am not certain that it applies currently.   3. Class 2 severe obesity due to excess calories with serious comorbidity and body mass index (BMI) of 36.0 to 36.9 in adult (HCC)   4. Supervision of high risk pregnancy, antepartum - follow up MFM u/s next week  5. Previous gastric bypass affecting pregnancy, antepartum - on vitamins  6. Vitamin D deficiency  - Vitamin D (25 hydroxy)  Preterm labor symptoms and general obstetric precautions including but not limited to vaginal bleeding, contractions, leaking of fluid and fetal movement were reviewed in detail with the patient. Please refer to After Visit Summary for other counseling recommendations.  Return in about 4 weeks (around 11/03/2018) for 2 hour GTT at next visit.  Future Appointments  Date Time Provider Department Center  10/13/2018 11:15 AM [facility] US 4 [facility]    Allie BossierMyra C Elic Vencill, MD

## 2018-10-07 LAB — VITAMIN D 25 HYDROXY (VIT D DEFICIENCY, FRACTURES): Vit D, 25-Hydroxy: 19 ng/mL — ABNORMAL LOW (ref 30.0–100.0)

## 2018-10-07 LAB — VITAMIN B12: Vitamin B-12: 525 pg/mL (ref 232–1245)

## 2018-10-10 ENCOUNTER — Other Ambulatory Visit: Payer: Self-pay | Admitting: Obstetrics & Gynecology

## 2018-10-10 ENCOUNTER — Telehealth: Payer: Self-pay | Admitting: General Practice

## 2018-10-10 MED ORDER — VITAMIN D (ERGOCALCIFEROL) 1.25 MG (50000 UNIT) PO CAPS: 50000.0000 [IU] | ORAL_CAPSULE | ORAL | 0 refills | Status: DC

## 2018-10-10 NOTE — Progress Notes (Unsigned)
Vitamin D prescribed for deficiency. 

## 2018-10-10 NOTE — Telephone Encounter (Signed)
-----   Message from Allie BossierMyra C Dove, MD sent at 10/10/2018  8:07 AM EST ----- Please let her know that she needs to take her vitamin d weekly for her deficiency. I will prescribe the meds.

## 2018-10-10 NOTE — Telephone Encounter (Signed)
Called patient & informed her of results and prescription sent to pharmacy. Patient verbalized understanding and asked about other test results. Told patient Dr Marice Potterove only commented on her vitamin D results so other labs were likely normal. Patient verbalized understanding & had no other questions.

## 2018-10-13 ENCOUNTER — Encounter (HOSPITAL_COMMUNITY): Payer: Self-pay

## 2018-10-13 ENCOUNTER — Other Ambulatory Visit (HOSPITAL_COMMUNITY): Payer: Self-pay | Admitting: *Deleted

## 2018-10-13 ENCOUNTER — Ambulatory Visit (HOSPITAL_COMMUNITY)
Admission: RE | Admit: 2018-10-13 | Discharge: 2018-10-13 | Disposition: A | Discharge status: Home / Self Care | Payer: Medicaid Other | Source: Ambulatory Visit | Attending: Obstetrics & Gynecology | Admitting: Obstetrics & Gynecology

## 2018-10-13 DIAGNOSIS — O10012 Pre-existing essential hypertension complicating pregnancy, second trimester: Secondary | ICD-10-CM | POA: Diagnosis not present

## 2018-10-13 DIAGNOSIS — Z3A22 22 weeks gestation of pregnancy: Secondary | ICD-10-CM

## 2018-10-13 DIAGNOSIS — O09522 Supervision of elderly multigravida, second trimester: Secondary | ICD-10-CM

## 2018-10-13 DIAGNOSIS — O09529 Supervision of elderly multigravida, unspecified trimester: Secondary | ICD-10-CM

## 2018-10-13 DIAGNOSIS — O9942 Diseases of the circulatory system complicating childbirth: Secondary | ICD-10-CM | POA: Diagnosis not present

## 2018-10-13 DIAGNOSIS — O99212 Obesity complicating pregnancy, second trimester: Secondary | ICD-10-CM

## 2018-10-26 ENCOUNTER — Encounter: Payer: Self-pay | Admitting: Family Medicine

## 2018-11-02 ENCOUNTER — Other Ambulatory Visit: Payer: Self-pay | Admitting: *Deleted

## 2018-11-02 DIAGNOSIS — O09513 Supervision of elderly primigravida, third trimester: Secondary | ICD-10-CM

## 2018-11-03 ENCOUNTER — Ambulatory Visit (INDEPENDENT_AMBULATORY_CARE_PROVIDER_SITE_OTHER): Payer: BLUE CROSS/BLUE SHIELD | Admitting: Obstetrics & Gynecology

## 2018-11-03 ENCOUNTER — Other Ambulatory Visit: Payer: Medicaid Other

## 2018-11-03 ENCOUNTER — Encounter: Payer: Self-pay | Admitting: Obstetrics & Gynecology

## 2018-11-03 VITALS — BP 122/84 | HR 91 | Wt 243.0 lb

## 2018-11-03 DIAGNOSIS — Z23 Encounter for immunization: Secondary | ICD-10-CM | POA: Diagnosis not present

## 2018-11-03 DIAGNOSIS — E559 Vitamin D deficiency, unspecified: Secondary | ICD-10-CM

## 2018-11-03 DIAGNOSIS — Z6836 Body mass index (BMI) 36.0-36.9, adult: Secondary | ICD-10-CM

## 2018-11-03 DIAGNOSIS — O99842 Bariatric surgery status complicating pregnancy, second trimester: Secondary | ICD-10-CM

## 2018-11-03 DIAGNOSIS — O99840 Bariatric surgery status complicating pregnancy, unspecified trimester: Secondary | ICD-10-CM

## 2018-11-03 DIAGNOSIS — O09513 Supervision of elderly primigravida, third trimester: Secondary | ICD-10-CM

## 2018-11-03 DIAGNOSIS — O10919 Unspecified pre-existing hypertension complicating pregnancy, unspecified trimester: Secondary | ICD-10-CM

## 2018-11-03 DIAGNOSIS — O10912 Unspecified pre-existing hypertension complicating pregnancy, second trimester: Secondary | ICD-10-CM

## 2018-11-03 DIAGNOSIS — O0992 Supervision of high risk pregnancy, unspecified, second trimester: Secondary | ICD-10-CM

## 2018-11-03 DIAGNOSIS — O09512 Supervision of elderly primigravida, second trimester: Secondary | ICD-10-CM

## 2018-11-03 DIAGNOSIS — O0990 Supervision of high risk pregnancy, unspecified, unspecified trimester: Secondary | ICD-10-CM

## 2018-11-03 MED ORDER — VITAMIN D (ERGOCALCIFEROL) 1.25 MG (50000 UNIT) PO CAPS: 50000.0000 [IU] | ORAL_CAPSULE | ORAL | 0 refills | Status: DC

## 2018-11-03 NOTE — Patient Instructions (Signed)
Tdap Vaccine (Tetanus, Diphtheria and Pertussis): What You Need to Know  1. Why get vaccinated?  Tetanus, diphtheria and pertussis are very serious diseases. Tdap vaccine can protect us from these diseases. And, Tdap vaccine given to pregnant women can protect newborn babies against pertussis..  TETANUS (Lockjaw) is rare in the [place] today. It causes painful muscle tightening and stiffness, usually all over the body.  · It can lead to tightening of muscles in the head and neck so you can't open your mouth, swallow, or sometimes even breathe. Tetanus kills about 1 out of 10 people who are infected even after receiving the best medical care.  DIPHTHERIA is also rare in the [place] today. It can cause a thick coating to form in the back of the throat.  · It can lead to breathing problems, heart failure, paralysis, and death.  PERTUSSIS (Whooping Cough) causes severe coughing spells, which can cause difficulty breathing, vomiting and disturbed sleep.  · It can also lead to weight loss, incontinence, and rib fractures. Up to 2 in 100 adolescents and 5 in 100 adults with pertussis are hospitalized or have complications, which could include pneumonia or death.  These diseases are caused by bacteria. Diphtheria and pertussis are spread from person to person through secretions from coughing or sneezing. Tetanus enters the body through cuts, scratches, or wounds.  Before vaccines, as many as 200,000 cases of diphtheria, 200,000 cases of pertussis, and hundreds of cases of tetanus, were reported in the [place] each year. Since vaccination began, reports of cases for tetanus and diphtheria have dropped by about 99% and for pertussis by about 80%.  2. Tdap vaccine  Tdap vaccine can protect adolescents and adults from tetanus, diphtheria, and pertussis. One dose of Tdap is routinely given at age 11 or 12. People who did not get Tdap at that age should get it as soon as possible.  Tdap is especially important  for healthcare professionals and anyone having close contact with a baby younger than 12 months.  Pregnant women should get a dose of Tdap during every pregnancy, to protect the newborn from pertussis. Infants are most at risk for severe, life-threatening complications from pertussis.  Another vaccine, called Td, protects against tetanus and diphtheria, but not pertussis. A Td booster should be given every 10 years. Tdap may be given as one of these boosters if you have never gotten Tdap before. Tdap may also be given after a severe cut or burn to prevent tetanus infection.  Your doctor or the person giving you the vaccine can give you more information.  Tdap may safely be given at the same time as other vaccines.  3. Some people should not get this vaccine  · A person who has ever had a life-threatening allergic reaction after a previous dose of any diphtheria, tetanus or pertussis containing vaccine, OR has a severe allergy to any part of this vaccine, should not get Tdap vaccine. Tell the person giving the vaccine about any severe allergies.  · Anyone who had coma or long repeated seizures within 7 days after a childhood dose of DTP or DTaP, or a previous dose of Tdap, should not get Tdap, unless a cause other than the vaccine was found. They can still get Td.  · Talk to your doctor if you:  ? have seizures or another nervous system problem,  ? had severe pain or swelling after any vaccine containing diphtheria, tetanus or pertussis,  ? ever had a condition   called Guillain-Barré Syndrome (GBS),  ? aren't feeling well on the day the shot is scheduled.  4. Risks  With any medicine, including vaccines, there is a chance of side effects. These are usually mild and go away on their own. Serious reactions are also possible but are rare.  Most people who get Tdap vaccine do not have any problems with it.  Mild problems following Tdap  (Did not interfere with activities)  · Pain where the shot was given (about 3 in 4  adolescents or 2 in 3 adults)  · Redness or swelling where the shot was given (about 1 person in 5)  · Mild fever of at least 100.4°F (up to about 1 in 25 adolescents or 1 in 100 adults)  · Headache (about 3 or 4 people in 10)  · Tiredness (about 1 person in 3 or 4)  · Nausea, vomiting, diarrhea, stomach ache (up to 1 in 4 adolescents or 1 in 10 adults)  · Chills, sore joints (about 1 person in 10)  · Body aches (about 1 person in 3 or 4)  · Rash, swollen glands (uncommon)  Moderate problems following Tdap  (Interfered with activities, but did not require medical attention)  · Pain where the shot was given (up to 1 in 5 or 6)  · Redness or swelling where the shot was given (up to about 1 in 16 adolescents or 1 in 12 adults)  · Fever over 102°F (about 1 in 100 adolescents or 1 in 250 adults)  · Headache (about 1 in 7 adolescents or 1 in 10 adults)  · Nausea, vomiting, diarrhea, stomach ache (up to 1 or 3 people in 100)  · Swelling of the entire arm where the shot was given (up to about 1 in 500).  Severe problems following Tdap  (Unable to perform usual activities; required medical attention)  · Swelling, severe pain, bleeding and redness in the arm where the shot was given (rare).  Problems that could happen after any vaccine:  · People sometimes faint after a medical procedure, including vaccination. Sitting or lying down for about 15 minutes can help prevent fainting, and injuries caused by a fall. Tell your doctor if you feel dizzy, or have vision changes or ringing in the ears.  · Some people get severe pain in the shoulder and have difficulty moving the arm where a shot was given. This happens very rarely.  · Any medication can cause a severe allergic reaction. Such reactions from a vaccine are very rare, estimated at fewer than 1 in a million doses, and would happen within a few minutes to a few hours after the vaccination.  As with any medicine, there is a very remote chance of a vaccine causing a serious  injury or death.  The safety of vaccines is always being monitored. For more information, visit: www.cdc.gov/vaccinesafety/  5. What if there is a serious problem?  What should I look for?  · Look for anything that concerns you, such as signs of a severe allergic reaction, very high fever, or unusual behavior.  Signs of a severe allergic reaction can include hives, swelling of the face and throat, difficulty breathing, a fast heartbeat, dizziness, and weakness. These would usually start a few minutes to a few hours after the vaccination.  What should I do?  · If you think it is a severe allergic reaction or other emergency that can't wait, call 9-1-1 or get the person to the nearest hospital. Otherwise,   call your doctor.  · Afterward, the reaction should be reported to the Vaccine Adverse Event Reporting System (VAERS). Your doctor might file this report, or you can do it yourself through the VAERS web site at www.vaers.hhs.gov, or by calling 1-800-822-7967.  VAERS does not give medical advice.  6. The National Vaccine Injury Compensation Program  The National Vaccine Injury Compensation Program (VICP) is a federal program that was created to compensate people who may have been injured by certain vaccines.  Persons who believe they may have been injured by a vaccine can learn about the program and about filing a claim by calling 1-800-338-2382 or visiting the VICP website at www.hrsa.gov/vaccinecompensation. There is a time limit to file a claim for compensation.  7. How can I learn more?  · Ask your doctor. He or she can give you the vaccine package insert or suggest other sources of information.  · Call your local or state health department.  · Contact the Centers for Disease Control and Prevention (CDC):  ? Call 1-800-232-4636 (1-800-CDC-INFO) or  ? Visit CDC's website at www.cdc.gov/vaccines  Vaccine Information Statement Tdap Vaccine (12/18/2013)  This information is not intended to replace advice given to you  by your health care provider. Make sure you discuss any questions you have with your health care provider.  Document Released: 04/11/2012 Document Revised: 05/29/2018 Document Reviewed: 05/29/2018  Elsevier Interactive Patient Education © 2019 [employer]

## 2018-11-03 NOTE — Progress Notes (Signed)
   PRENATAL VISIT NOTE  Subjective:  Vanessa Braun is a 38 y.o. G4P0030 at 110w5d being seen today for ongoing prenatal care.  She is currently monitored for the following issues for this high-risk pregnancy and has Oligomenorrhea; Essential hypertension; Primary female infertility; Obesity; S/P gastric bypass; Eczema of both hands; AMA (advanced maternal age) primigravida 35+; Supervision of high risk pregnancy, antepartum; Chronic hypertension during pregnancy, antepartum; Previous gastric bypass affecting pregnancy, antepartum; History of multiple miscarriages; and Vitamin D deficiency on their problem list.  Patient reports no complaints.  Contractions: Not present. Vag. Bleeding: None.  Movement: Present. Denies leaking of fluid.   The following portions of the patient's history were reviewed and updated as appropriate: allergies, current medications, past family history, past medical history, past social history, past surgical history and problem list. Problem list updated.  Objective:   Vitals:   11/03/18 0929  BP: 122/84  Pulse: 91  Weight: 243 lb (110.2 kg)    Fetal Status:     Movement: Present     General:  Alert, oriented and cooperative. Patient is in no acute distress.  Skin: Skin is warm and dry. No rash noted.   Cardiovascular: Normal heart rate noted  Respiratory: Normal respiratory effort, no problems with respiration noted  Abdomen: Soft, gravid, appropriate for gestational age.  Pain/Pressure: Absent     Pelvic: Cervical exam deferred        Extremities: Normal range of motion.  Edema: Mild pitting, slight indentation  Mental Status: Normal mood and affect. Normal behavior. Normal judgment and thought content.   Assessment and Plan:  Pregnancy: G4P0030 at [redacted]w[redacted]d  1. Chronic hypertension during pregnancy, antepartum  - Tdap vaccine greater than or equal to 7yo IM  2. Primigravida of advanced maternal age in second trimester - low risk NIPS, female  3.  Supervision of high risk pregnancy, antepartum   4. Vitamin D deficiency - has improved from 15 to 53 - rec'd that she take the Vit D with milk instead of water  5. Class 2 severe obesity due to excess calories with serious comorbidity and body mass index (BMI) of 36.0 to 36.9 in adult (HCC)   6. Previous gastric bypass affecting pregnancy, antepartum - monthly MFM u/s  Preterm labor symptoms and general obstetric precautions including but not limited to vaginal bleeding, contractions, leaking of fluid and fetal movement were reviewed in detail with the patient. Please refer to After Visit Summary for other counseling recommendations.  Return in about 3 weeks (around 11/24/2018).  Future Appointments  Date Time Provider Department Center  11/03/2018 10:00 AM [facility]  11/10/2018 11:30 AM [facility] [place] 5 [facility]    Allie Bossier, MD

## 2018-11-06 LAB — HIV ANTIBODY (ROUTINE TESTING W REFLEX): HIV Screen 4th Generation wRfx: NONREACTIVE

## 2018-11-06 LAB — CBC
HEMATOCRIT: 30.9 % — AB (ref 34.0–46.6)
Hemoglobin: 10.2 g/dL — ABNORMAL LOW (ref 11.1–15.9)
MCH: 30.2 pg (ref 26.6–33.0)
MCHC: 33 g/dL (ref 31.5–35.7)
MCV: 91 fL (ref 79–97)
Platelets: 176 10*3/uL (ref 150–450)
RBC: 3.38 x10E6/uL — ABNORMAL LOW (ref 3.77–5.28)
RDW: 12.5 % (ref 11.7–15.4)
WBC: 7.5 10*3/uL (ref 3.4–10.8)

## 2018-11-06 LAB — GLUCOSE TOLERANCE, 2 HOURS W/ 1HR
Glucose, 1 hour: 141 mg/dL (ref 65–179)
Glucose, 2 hour: 38 mg/dL — CL (ref 65–152)
Glucose, Fasting: 72 mg/dL (ref 65–91)

## 2018-11-06 LAB — RPR: RPR: NONREACTIVE

## 2018-11-10 ENCOUNTER — Other Ambulatory Visit (HOSPITAL_COMMUNITY): Payer: Self-pay | Admitting: *Deleted

## 2018-11-10 ENCOUNTER — Other Ambulatory Visit (HOSPITAL_COMMUNITY): Payer: Self-pay | Admitting: Maternal & Fetal Medicine

## 2018-11-10 ENCOUNTER — Encounter (HOSPITAL_COMMUNITY): Payer: Self-pay

## 2018-11-10 ENCOUNTER — Ambulatory Visit (HOSPITAL_COMMUNITY)
Admission: RE | Admit: 2018-11-10 | Discharge: 2018-11-10 | Disposition: A | Discharge status: Home / Self Care | Payer: BLUE CROSS/BLUE SHIELD | Source: Ambulatory Visit | Attending: Obstetrics & Gynecology | Admitting: Obstetrics & Gynecology

## 2018-11-10 DIAGNOSIS — O09522 Supervision of elderly multigravida, second trimester: Secondary | ICD-10-CM | POA: Insufficient documentation

## 2018-11-10 DIAGNOSIS — O10012 Pre-existing essential hypertension complicating pregnancy, second trimester: Secondary | ICD-10-CM

## 2018-11-10 DIAGNOSIS — O99842 Bariatric surgery status complicating pregnancy, second trimester: Secondary | ICD-10-CM

## 2018-11-10 DIAGNOSIS — O99212 Obesity complicating pregnancy, second trimester: Secondary | ICD-10-CM | POA: Diagnosis not present

## 2018-11-10 DIAGNOSIS — Z3A26 26 weeks gestation of pregnancy: Secondary | ICD-10-CM

## 2018-11-10 DIAGNOSIS — O365990 Maternal care for other known or suspected poor fetal growth, unspecified trimester, not applicable or unspecified: Secondary | ICD-10-CM

## 2018-11-16 ENCOUNTER — Encounter (HOSPITAL_COMMUNITY): Payer: Self-pay | Admitting: *Deleted

## 2018-11-16 ENCOUNTER — Other Ambulatory Visit: Payer: Self-pay

## 2018-11-16 ENCOUNTER — Inpatient Hospital Stay (HOSPITAL_COMMUNITY)
Admission: AD | Admit: 2018-11-16 | Discharge: 2018-11-16 | Disposition: A | Discharge status: Home / Self Care | Payer: BLUE CROSS/BLUE SHIELD | Attending: Obstetrics and Gynecology | Admitting: Obstetrics and Gynecology

## 2018-11-16 DIAGNOSIS — Z880 Allergy status to penicillin: Secondary | ICD-10-CM | POA: Insufficient documentation

## 2018-11-16 DIAGNOSIS — O99840 Bariatric surgery status complicating pregnancy, unspecified trimester: Secondary | ICD-10-CM

## 2018-11-16 DIAGNOSIS — K30 Functional dyspepsia: Secondary | ICD-10-CM | POA: Diagnosis not present

## 2018-11-16 DIAGNOSIS — R1030 Lower abdominal pain, unspecified: Secondary | ICD-10-CM | POA: Diagnosis present

## 2018-11-16 DIAGNOSIS — O09522 Supervision of elderly multigravida, second trimester: Secondary | ICD-10-CM | POA: Diagnosis not present

## 2018-11-16 DIAGNOSIS — O10912 Unspecified pre-existing hypertension complicating pregnancy, second trimester: Secondary | ICD-10-CM | POA: Diagnosis not present

## 2018-11-16 DIAGNOSIS — R102 Pelvic and perineal pain: Secondary | ICD-10-CM

## 2018-11-16 DIAGNOSIS — O26892 Other specified pregnancy related conditions, second trimester: Secondary | ICD-10-CM

## 2018-11-16 DIAGNOSIS — O10919 Unspecified pre-existing hypertension complicating pregnancy, unspecified trimester: Secondary | ICD-10-CM

## 2018-11-16 DIAGNOSIS — O368120 Decreased fetal movements, second trimester, not applicable or unspecified: Secondary | ICD-10-CM | POA: Insufficient documentation

## 2018-11-16 DIAGNOSIS — O10012 Pre-existing essential hypertension complicating pregnancy, second trimester: Secondary | ICD-10-CM | POA: Diagnosis not present

## 2018-11-16 DIAGNOSIS — Z3A27 27 weeks gestation of pregnancy: Secondary | ICD-10-CM

## 2018-11-16 DIAGNOSIS — E559 Vitamin D deficiency, unspecified: Secondary | ICD-10-CM

## 2018-11-16 DIAGNOSIS — O99842 Bariatric surgery status complicating pregnancy, second trimester: Secondary | ICD-10-CM | POA: Insufficient documentation

## 2018-11-16 DIAGNOSIS — N96 Recurrent pregnancy loss: Secondary | ICD-10-CM

## 2018-11-16 DIAGNOSIS — O0990 Supervision of high risk pregnancy, unspecified, unspecified trimester: Secondary | ICD-10-CM

## 2018-11-16 DIAGNOSIS — N949 Unspecified condition associated with female genital organs and menstrual cycle: Secondary | ICD-10-CM

## 2018-11-16 LAB — URINALYSIS, ROUTINE W REFLEX MICROSCOPIC
BILIRUBIN URINE: NEGATIVE
Glucose, UA: NEGATIVE mg/dL
Hgb urine dipstick: NEGATIVE
KETONES UR: NEGATIVE mg/dL
Leukocytes, UA: NEGATIVE
NITRITE: NEGATIVE
Protein, ur: NEGATIVE mg/dL
Specific Gravity, Urine: 1.004 — ABNORMAL LOW (ref 1.005–1.030)
pH: 7 (ref 5.0–8.0)

## 2018-11-16 MED ORDER — COMFORT FIT MATERNITY SUPP LG MISC: 0 refills | Status: DC

## 2018-11-16 NOTE — MAU Note (Signed)
Presents with c/o upper and lower abdominal pain & cramping that began @ 0200 this morning. Took Tylenol extra strength x1 an hour ago. Denies VB.  Reports decreased FM.

## 2018-11-16 NOTE — MAU Provider Note (Signed)
History    CSN: [id number] Arrival date and time: 11/16/18 1000   Chief Complaint  Patient presents with  . Abdominal Pain   HPI 38yo G4P0030 at [redacted]w[redacted]d who presents with upper and lower abdominal pain, cramping, and decreased fetal movement. Pregnancy complicated by AMA, cHTN, vitamin D deficiency, history of 3 prior miscarriages, history of gastric bypass. 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 Overnight, she started to notice upper abdominal pain after drinking apple juice and having a cookie. States she has to eat quickly at work because of short breaks. Denies any nausea or vomiting. Bowel movements alternate daily - sometimes constipation and sometimes has 2-3 in a day that she has urgency and come out easily. Got off work around 0200 and had trouble sleeping because discomfort. Also has been having "pulling" in lower pelvic area for about the last week. Worried she is not feeling baby move very much. Also worried that baby is small, states was told 5% at last ultrasound. Denies contractions, vaginal bleeding, leakage of fluids. Denies headaches, lightheadedness. Denies back pain. Does have some leakage of urine especially when coughing.   OB History    Gravida  4   Para  0   Term  0   Preterm  0   AB  3   Living  0     SAB  3   TAB  0   Ectopic  0   Multiple  0   Live Births  0           Past Medical History:  Diagnosis Date  . Abnormal Pap smear   . Chronic dermatitis of hands   . Eczema   . GERD (gastroesophageal reflux disease)    lost weight with bypass surgery, no problems since  . PCOS (polycystic ovarian syndrome)     Past Surgical History:  Procedure Laterality Date  . ADENOIDECTOMY    . DILATION AND CURETTAGE OF UTERUS    . DILATION AND EVACUATION N/A 04/01/2014   Procedure: DILATATION AND EVACUATION;  Surgeon: Robley Fries, MD;  Location: [facility];  Service: Gynecology;  Laterality: N/A;  . DILATION AND EVACUATION N/A 12/23/2017   Procedure: DILATATION AND EVACUATION;  Surgeon: Willodean Rosenthal, MD;  Location: [facility];  Service: Gynecology;  Laterality: N/A;  . GASTRIC BYPASS    . TONSILLECTOMY AND ADENOIDECTOMY  1999    Family History  Problem Relation Age of Onset  . Diabetes Mother   . Hyperlipidemia Mother   . Hypertension Mother   . Diabetes Father   . Kidney disease Father   . Hypertension Father   . Hyperlipidemia Father   . Cancer Father        pancreatic  . Gestational diabetes Sister   . Depression Sister   . Diabetes Sister   . Heart disease Maternal Grandmother   . Heart disease Paternal Grandmother     Social History   Tobacco Use  . Smoking status: Never Smoker  . Smokeless tobacco: Never Used  Substance Use Topics  . Alcohol use: Not Currently    Frequency: Never  . Drug use: No    Allergies:  Allergies  Allergen Reactions  . Other     "anything antibacterial"; some creams for dermatitis  . Penicillins Itching and Rash    Medications Prior to Admission  Medication Sig Dispense Refill Last Dose  . diphenhydrAMINE HCl (BENADRYL ALLERGY PO) Take by mouth.   Taking  . ferrous sulfate 325 (65 FE)  MG tablet Take 325 mg by mouth daily with breakfast.   Taking  . Prenatal Vit-Fe Fumarate-FA (PRENATAL MULTIVITAMIN) TABS tablet Take 1 tablet by mouth daily at 12 noon.   Taking  . Vitamin D, Ergocalciferol, (DRISDOL) 1.25 MG (50000 UT) CAPS capsule Take 1 capsule (50,000 Units total) by mouth every 7 (seven) days. 12 capsule 0 Taking  . Vitamin D, Ergocalciferol, (DRISDOL) 1.25 MG (50000 UT) CAPS capsule Take 1 capsule (50,000 Units total) by mouth every 7 (seven) days. 8 capsule 0 Taking    Review of Systems  Constitutional: Negative for activity change, appetite change, fatigue and fever.  HENT: Negative for congestion.   Eyes: Negative for visual disturbance.  Respiratory: Negative for shortness of breath.   Cardiovascular: Positive for leg swelling ("little bit"). Negative  for chest pain.  Gastrointestinal: Positive for abdominal pain (upper ). Negative for blood in stool, constipation, diarrhea, nausea and vomiting.  Genitourinary: Positive for pelvic pain ("pulling"). Negative for dysuria, frequency, vaginal bleeding and vaginal discharge.  Musculoskeletal: Negative for back pain and myalgias.  Skin: Negative for rash.  Neurological: Negative for dizziness, light-headedness and headaches.  Psychiatric/Behavioral: Positive for sleep disturbance. The patient is not nervous/anxious.    Physical Exam   Blood pressure 126/66, pulse (!) 54, temperature 97.8 F (36.6 C), temperature source Oral, resp. rate 20, height 5\' 9"  (1.753 m), weight 111.6 kg, last menstrual period 05/07/2018, SpO2 100 %, unknown if currently breastfeeding.  Physical Exam  Nursing note and vitals reviewed. Constitutional: She is oriented to person, place, and time. She appears well-developed and well-nourished. No distress.  HENT:  Head: Normocephalic and atraumatic.  Eyes: Conjunctivae and EOM are normal. No scleral icterus.  Cardiovascular: Normal rate and intact distal pulses.  Respiratory: No respiratory distress.  GI: Soft. There is abdominal tenderness (epigastric). There is no guarding.  Genitourinary:    Genitourinary Comments: Normal external genitalia  normal vaginal mucosa, minimal amount of discharge, cervix without lesions, visually closed   Musculoskeletal:        General: Edema (trace pitting) present.  Neurological: She is alert and oriented to person, place, and time.  Skin: Skin is warm and dry.  Psychiatric: She has a normal mood and affect. Her behavior is normal.   MAU Course  Procedures  MDM -- reassuring fetal tracing - 140-150s baseline, no decelerations, no contractions  Assessment and Plan  Jaquelyn Ferren is a 38yo G4P0030 at [redacted]w[redacted]d who presented to MAU with upper abdominal pain and lower abdominal cramping/pulling. Upper abdominal pain most likely  related to indigestion, eating quickly especially with history of gastric bypass. Lower abdominal pain most likely related to round ligament pain. No evidence of contractions, cervix closed. U/A wnl, no abnormal discharge. Reassuring fetal status. Discharged home with Rx for maternity belt, recommendations for eating small, frequent meals. Reviewed return precautions. All questions answered prior to discharge.   Tamera Stands, DO  11/16/2018, 11:01 AM

## 2018-11-16 NOTE — Discharge Instructions (Signed)
·   Eat small, frequent meals  Drink water instead of sugary beverages  Try to have protein throughout the day     PREGNANCY SUPPORT BELT: You are not alone, Seventy-five percent of women have some sort of abdominal or back pain at some point in their pregnancy. Your baby is growing at a fast pace, which means that your whole body is rapidly trying to adjust to the changes. As your uterus grows, your back may start feeling a bit under stress and this can result in back or abdominal pain that can go from mild, and therefore bearable, to severe pains that will not allow you to sit or lay down comfortably, When it comes to dealing with pregnancy-related pains and cramps, some pregnant women usually prefer natural remedies, which the market is filled with nowadays. For example, wearing a pregnancy support belt can help ease and lessen your discomfort and pain. WHAT ARE THE BENEFITS OF WEARING A PREGNANCY SUPPORT BELT? A pregnancy support belt provides support to the lower portion of the belly taking some of the weight of the growing uterus and distributing to the other parts of your body. It is designed make you comfortable and gives you extra support. Over the years, the pregnancy apparel market has been studying the needs and wants of pregnant women and they have come up with the most comfortable pregnancy support belts that woman could ever ask for. In fact, you will no longer have to wear a stretched-out or bulky pregnancy belt that is visible underneath your clothes and makes you feel even more uncomfortable. Nowadays, a pregnancy support belt is made of comfortable and stretchy materials that will not irritate your skin but will actually make you feel at ease and you will not even notice you are wearing it. They are easy to put on and adjust during the day and can be worn at night for additional support.  BENEFITS:  Relives Back pain  Relieves Abdominal Muscle and Leg Pain  Stabilizes the Pelvic  Ring  Offers a Cushioned Abdominal Lift Pad  Relieves pressure on the Sciatic Nerve Within Minutes WHERE TO GET YOUR PREGNANCY BELT: [employer] [phone] @[phone]  [address] [place], [place] [address]

## 2018-11-24 ENCOUNTER — Ambulatory Visit (INDEPENDENT_AMBULATORY_CARE_PROVIDER_SITE_OTHER): Payer: BLUE CROSS/BLUE SHIELD | Admitting: Family Medicine

## 2018-11-24 VITALS — BP 118/69 | HR 67 | Wt 241.5 lb

## 2018-11-24 DIAGNOSIS — O10913 Unspecified pre-existing hypertension complicating pregnancy, third trimester: Secondary | ICD-10-CM

## 2018-11-24 DIAGNOSIS — O0990 Supervision of high risk pregnancy, unspecified, unspecified trimester: Secondary | ICD-10-CM

## 2018-11-24 DIAGNOSIS — R110 Nausea: Secondary | ICD-10-CM

## 2018-11-24 DIAGNOSIS — O0993 Supervision of high risk pregnancy, unspecified, third trimester: Secondary | ICD-10-CM

## 2018-11-24 DIAGNOSIS — O10919 Unspecified pre-existing hypertension complicating pregnancy, unspecified trimester: Secondary | ICD-10-CM

## 2018-11-24 LAB — POCT URINALYSIS DIP (DEVICE)
Glucose, UA: NEGATIVE mg/dL
HGB URINE DIPSTICK: NEGATIVE
Ketones, ur: NEGATIVE mg/dL
LEUKOCYTES UA: NEGATIVE
NITRITE: NEGATIVE
Protein, ur: 30 mg/dL — AB
Urobilinogen, UA: 1 mg/dL (ref 0.0–1.0)
pH: 6 (ref 5.0–8.0)

## 2018-11-24 MED ORDER — PROMETHAZINE HCL 12.5 MG PO TABS: 12.5000 mg | ORAL_TABLET | Freq: Four times a day (QID) | ORAL | 0 refills | Status: DC | PRN

## 2018-11-24 NOTE — Progress Notes (Signed)
   PRENATAL VISIT NOTE Subjective:  Vanessa Braun is a 38 y.o. G4P0030 at 7083w5d being seen today for ongoing prenatal care.  She is currently monitored for the following issues for this high-risk pregnancy and has Obesity; S/P gastric bypass; Eczema of both hands; AMA (advanced maternal age) primigravida 3035+; Supervision of high risk pregnancy, antepartum; Chronic hypertension during pregnancy, antepartum; Previous gastric bypass affecting pregnancy, antepartum; History of multiple miscarriages; and Vitamin D deficiency on their problem list.   - feels like poor appetite, wondering about weight loss - sometimes goes two days and doesn't feel like eating or drinking much - not much vomiting, just nausea or indigestion - also sometimes diarrhea versus constipation  - no headaches, vision changes, no more abdominal pain    Contractions: Not present. Vag. Bleeding: None.  Movement: Present. Denies leaking of fluid.   The following portions of the patient's history were reviewed and updated as appropriate: allergies, current medications, past family history, past medical history, past social history, past surgical history and problem list. Problem list updated.  Objective:   Vitals:   11/24/18 1004  BP: 118/69  Pulse: 67  Weight: 241 lb 8 oz (109.5 kg)   Fetal Status: Fetal Heart Rate (bpm): 155 Fundal Height: 29 cm(though difficult based on body habitus) Movement: Present     General:  Alert, oriented and cooperative. Patient is in no acute distress.  Skin: Skin is warm and dry. No rash noted.   Cardiovascular: Normal heart rate noted  Respiratory: Normal respiratory effort, no problems with respiration noted  Abdomen: Soft, gravid, appropriate for gestational age.  Pain/Pressure: Present     Pelvic: Cervical exam deferred        Extremities: Normal range of motion.  Edema: Trace  Mental Status: Normal mood and affect. Normal behavior. Normal judgment and thought content.    Assessment and Plan:  Pregnancy: G4P0030 at 3383w5d  1. Chronic hypertension during pregnancy, antepartum: Asymptomatic. Did have AC 5% at last growth U/S, so will continue to monitor closely. Asymptomatic.  BP Readings from Last 3 Encounters:  11/24/18 118/69  11/16/18 126/61  11/10/18 (!) 115/55  -- consider repeat CMP, UPC at next visit (trace protein on dip today)  -- repeat growth U/S next week   2. Supervision of high risk pregnancy, antepartum -- prenatal record reviewed and UTD, including 28-week labs   3. Nausea  Poor Weight Gain: Has not gained any weight this pregnancy, has lost 5lbs in last several weeks. 2hr GTT with hypoglycemia on 2-hr check (30s).  -- Rx for phenergan prn -- counseled on adequate dietary intake, small/frequent meals, higher protein snacks  -- repeat growth U/S next week   Preterm labor symptoms and general obstetric precautions including but not limited to vaginal bleeding, contractions, leaking of fluid and fetal movement were reviewed in detail with the patient. Please refer to After Visit Summary for other counseling recommendations.  Return in about 2 weeks (around 12/08/2018) for HROB.  Future Appointments  Date Time Provider Department Center  12/01/2018 10:30 AM [facility] US 5 [facility]  12/08/2018 11:35 AM Tamera StandsWallace, Sible Straley S, DO [facility]   Tamera StandsLaurel S Shermeka Rutt, [place]

## 2018-11-24 NOTE — Patient Instructions (Signed)
   Try small, frequent meals with lots of protein  Drink plenty of fluids  Okay to have some tilapia, tuna  Plenty of fruits and vegetables  Try phenergan before meals

## 2018-12-01 ENCOUNTER — Ambulatory Visit (HOSPITAL_COMMUNITY)
Admission: RE | Admit: 2018-12-01 | Discharge: 2018-12-01 | Disposition: A | Discharge status: Home / Self Care | Payer: BLUE CROSS/BLUE SHIELD | Source: Ambulatory Visit | Attending: Obstetrics & Gynecology | Admitting: Obstetrics & Gynecology

## 2018-12-01 ENCOUNTER — Encounter (HOSPITAL_COMMUNITY): Payer: Self-pay

## 2018-12-01 ENCOUNTER — Other Ambulatory Visit (HOSPITAL_COMMUNITY): Payer: Self-pay | Admitting: *Deleted

## 2018-12-01 DIAGNOSIS — O365990 Maternal care for other known or suspected poor fetal growth, unspecified trimester, not applicable or unspecified: Secondary | ICD-10-CM | POA: Diagnosis not present

## 2018-12-01 DIAGNOSIS — O99212 Obesity complicating pregnancy, second trimester: Secondary | ICD-10-CM

## 2018-12-01 DIAGNOSIS — O99842 Bariatric surgery status complicating pregnancy, second trimester: Secondary | ICD-10-CM | POA: Diagnosis not present

## 2018-12-01 DIAGNOSIS — O10012 Pre-existing essential hypertension complicating pregnancy, second trimester: Secondary | ICD-10-CM | POA: Diagnosis not present

## 2018-12-01 DIAGNOSIS — O10919 Unspecified pre-existing hypertension complicating pregnancy, unspecified trimester: Secondary | ICD-10-CM

## 2018-12-01 DIAGNOSIS — O09522 Supervision of elderly multigravida, second trimester: Secondary | ICD-10-CM | POA: Diagnosis not present

## 2018-12-01 DIAGNOSIS — Z3A29 29 weeks gestation of pregnancy: Secondary | ICD-10-CM

## 2018-12-01 DIAGNOSIS — Z148 Genetic carrier of other disease: Secondary | ICD-10-CM

## 2018-12-03 ENCOUNTER — Encounter (HOSPITAL_COMMUNITY): Payer: Self-pay

## 2018-12-03 ENCOUNTER — Other Ambulatory Visit: Payer: Self-pay

## 2018-12-03 ENCOUNTER — Inpatient Hospital Stay (HOSPITAL_COMMUNITY)
Admission: AD | Admit: 2018-12-03 | Discharge: 2018-12-03 | Disposition: A | Discharge status: Home / Self Care | Payer: BLUE CROSS/BLUE SHIELD | Source: Ambulatory Visit | Attending: Obstetrics and Gynecology | Admitting: Obstetrics and Gynecology

## 2018-12-03 DIAGNOSIS — Z3A30 30 weeks gestation of pregnancy: Secondary | ICD-10-CM | POA: Diagnosis not present

## 2018-12-03 DIAGNOSIS — R300 Dysuria: Secondary | ICD-10-CM | POA: Diagnosis present

## 2018-12-03 DIAGNOSIS — Z880 Allergy status to penicillin: Secondary | ICD-10-CM | POA: Diagnosis not present

## 2018-12-03 DIAGNOSIS — O26893 Other specified pregnancy related conditions, third trimester: Secondary | ICD-10-CM

## 2018-12-03 DIAGNOSIS — E559 Vitamin D deficiency, unspecified: Secondary | ICD-10-CM

## 2018-12-03 DIAGNOSIS — O09523 Supervision of elderly multigravida, third trimester: Secondary | ICD-10-CM | POA: Diagnosis not present

## 2018-12-03 DIAGNOSIS — O10919 Unspecified pre-existing hypertension complicating pregnancy, unspecified trimester: Secondary | ICD-10-CM

## 2018-12-03 DIAGNOSIS — N96 Recurrent pregnancy loss: Secondary | ICD-10-CM

## 2018-12-03 DIAGNOSIS — O99840 Bariatric surgery status complicating pregnancy, unspecified trimester: Secondary | ICD-10-CM

## 2018-12-03 DIAGNOSIS — O0990 Supervision of high risk pregnancy, unspecified, unspecified trimester: Secondary | ICD-10-CM

## 2018-12-03 LAB — URINALYSIS, ROUTINE W REFLEX MICROSCOPIC
BILIRUBIN URINE: NEGATIVE
GLUCOSE, UA: NEGATIVE mg/dL
Hgb urine dipstick: NEGATIVE
KETONES UR: NEGATIVE mg/dL
Leukocytes, UA: NEGATIVE
NITRITE: NEGATIVE
PH: 6.5 (ref 5.0–8.0)
Protein, ur: NEGATIVE mg/dL
Specific Gravity, Urine: <1.005 — ABNORMAL LOW (ref 1.005–1.030)

## 2018-12-03 LAB — WET PREP, GENITAL
Clue Cells Wet Prep HPF POC: NONE SEEN
Sperm: NONE SEEN
TRICH WET PREP: NONE SEEN
Yeast Wet Prep HPF POC: NONE SEEN

## 2018-12-03 MED ORDER — PHENAZOPYRIDINE HCL 100 MG PO TABS: 100.0000 mg | ORAL_TABLET | Freq: Three times a day (TID) | ORAL | 0 refills | Status: AC

## 2018-12-03 NOTE — MAU Note (Signed)
Pt here with c/o pain with urination. Also has frequency, but that's "not new." Denies any bleeding or leaking. Reports good fetal movement.

## 2018-12-03 NOTE — MAU Provider Note (Signed)
History     CSN: [id number]  Arrival date and time: 12/03/18 8850   First Provider Initiated Contact with Patient 12/03/18 2013      Chief Complaint  Patient presents with  . Dysuria   Vanessa Braun is a 38 y.o. G4P0030 at [redacted]w[redacted]d who presents for Dysuria.  .  Patient states she woke around 10am and noted pain with urination.  She started to drink water to "flush it out" but it didn't seem to help.  She reports that she continues to have burning with urination despite drinking 4-5 bottled waters.  She endorses urinary hesitancy, frequency, difficulty, and urgency, but denies back or abdominal pain as well as constipation, diarrhea, or N/V.  She endorses fetal movement and denies cramping/contractions.  Patient also denies vaginal concerns or complaints, but does endorse SI last night.       OB History    Gravida  4   Para  0   Term  0   Preterm  0   AB  3   Living  0     SAB  3   TAB  0   Ectopic  0   Multiple  0   Live Births  0           Past Medical History:  Diagnosis Date  . Abnormal Pap smear   . Chronic dermatitis of hands   . Eczema   . GERD (gastroesophageal reflux disease)    lost weight with bypass surgery, no problems since  . PCOS (polycystic ovarian syndrome)     Past Surgical History:  Procedure Laterality Date  . ADENOIDECTOMY    . DILATION AND CURETTAGE OF UTERUS    . DILATION AND EVACUATION N/A 04/01/2014   Procedure: DILATATION AND EVACUATION;  Surgeon: Robley Fries, MD;  Location: [facility];  Service: Gynecology;  Laterality: N/A;  . DILATION AND EVACUATION N/A 12/23/2017   Procedure: DILATATION AND EVACUATION;  Surgeon: Willodean Rosenthal, MD;  Location: [facility];  Service: Gynecology;  Laterality: N/A;  . GASTRIC BYPASS    . TONSILLECTOMY AND ADENOIDECTOMY  1999    Family History  Problem Relation Age of Onset  . Diabetes Mother   . Hyperlipidemia Mother   . Hypertension Mother   . Diabetes Father   . Kidney disease  Father   . Hypertension Father   . Hyperlipidemia Father   . Cancer Father        pancreatic  . Gestational diabetes Sister   . Depression Sister   . Diabetes Sister   . Heart disease Maternal Grandmother   . Heart disease Paternal Grandmother     Social History   Tobacco Use  . Smoking status: Never Smoker  . Smokeless tobacco: Never Used  Substance Use Topics  . Alcohol use: Not Currently    Frequency: Never  . Drug use: No    Allergies:  Allergies  Allergen Reactions  . Other     "anything antibacterial"; some creams for dermatitis  . Penicillins Itching and Rash    Medications Prior to Admission  Medication Sig Dispense Refill Last Dose  . diphenhydrAMINE HCl (BENADRYL ALLERGY PO) Take by mouth.   Taking  . ferrous sulfate 325 (65 FE) MG tablet Take 325 mg by mouth daily with breakfast.   Taking  . Prenatal Vit-Fe Fumarate-FA (PRENATAL MULTIVITAMIN) TABS tablet Take 1 tablet by mouth daily at 12 noon.   Taking  . promethazine (PHENERGAN) 12.5 MG tablet Take 1 tablet (12.5  mg total) by mouth every 6 (six) hours as needed for nausea or vomiting. 30 tablet 0 Taking  . Vitamin D, Ergocalciferol, (DRISDOL) 1.25 MG (50000 UT) CAPS capsule Take 1 capsule (50,000 Units total) by mouth every 7 (seven) days. 8 capsule 0 Taking    Review of Systems  Constitutional: Negative for chills and fever.  Gastrointestinal: Negative for abdominal pain, constipation, diarrhea, nausea and vomiting.  Genitourinary: Positive for difficulty urinating, dysuria, frequency and urgency. Negative for vaginal bleeding and vaginal discharge. Enuresis: Burning.  Musculoskeletal: Negative for back pain.  Neurological: Negative for dizziness, light-headedness and headaches.   Physical Exam   Blood pressure 127/64, pulse 74, temperature 97.7 F (36.5 C), temperature source Oral, resp. rate 18, height 5\' 9"  (1.753 m), weight 111.6 kg, last menstrual period 05/07/2018, SpO2 100 %, unknown if currently  breastfeeding.  Physical Exam  Constitutional: She is oriented to person, place, and time. She appears well-developed and well-nourished. No distress.  HENT:  Head: Normocephalic and atraumatic.  Eyes: Conjunctivae are normal.  Neck: Normal range of motion.  Cardiovascular: Normal rate and regular rhythm.  Respiratory: Effort normal.  GI: Soft.  Genitourinary: There is no tenderness on the right labia. There is no tenderness on the left labia.    Vaginal discharge (At introitus) present.     Genitourinary Comments: Visual Exam:  Inspection of vaginal area reveals no erythema or tenderness to touch. Pink, moist mucosa. Urethra opening is pink, no erythema or discharge noted.  No apparent obstructions.  Scant amt white milky discharge at introitus.  Wet prep collected via blind swab. BME deferred   Musculoskeletal: Normal range of motion.  Neurological: She is alert and oriented to person, place, and time.  Skin: Skin is warm and dry.  Psychiatric: She has a normal mood and affect. Her behavior is normal.    Fetal Assessment 145 bpm, Mod Var, -Decels, +10x10 Accels Toco: Occassional  MAU Course   Results for orders placed or performed during the hospital encounter of 12/03/18 (from the past 24 hour(s))  Urinalysis, Routine w reflex microscopic     Status: Abnormal   Collection Time: 12/03/18  7:57 PM  Result Value Ref Range   Color, Urine YELLOW YELLOW   APPearance CLEAR CLEAR   Specific Gravity, Urine <1.005 (L) 1.005 - 1.030   pH 6.5 5.0 - 8.0   Glucose, UA NEGATIVE NEGATIVE mg/dL   Hgb urine dipstick NEGATIVE NEGATIVE   Bilirubin Urine NEGATIVE NEGATIVE   Ketones, ur NEGATIVE NEGATIVE mg/dL   Protein, ur NEGATIVE NEGATIVE mg/dL   Nitrite NEGATIVE NEGATIVE   Leukocytes, UA NEGATIVE NEGATIVE  Wet prep, genital     Status: Abnormal   Collection Time: 12/03/18  8:20 PM  Result Value Ref Range   Yeast Wet Prep HPF POC NONE SEEN NONE SEEN   Trich, Wet Prep NONE SEEN NONE  SEEN   Clue Cells Wet Prep HPF POC NONE SEEN NONE SEEN   WBC, Wet Prep HPF POC FEW (A) NONE SEEN   Sperm NONE SEEN     MDM PE Labs:UA & Wet prep   Assessment and Plan  38 year old G4P0 at 30 weeks Cat I FT Dysuria  -UA returns negative -Discussed and agrees with visual inspection to rule out other causes. -Exam findings discussed -Wet prep collected and sent.  -Will await results.   Follow Up (9:09 PM)  -Wet prep returns insignificant  -Results discussed with patient -Will give prescription for pyridium for dysuria -Rx for  pyridium 100mg  TID x 3 days sent to pharmacy on file. -Encouraged continuation of H2O intake. -Preterm Labor precautions given. -Encouraged to call or return to MAU if symptoms worsen or with the onset of new symptoms. -Keep appt as scheduled: Dec 08, 2018 -Discharged to home in stable condition   Cherre RobinsJessica L Kaio Kuhlman MSN, CNM 12/03/2018, 8:13 PM

## 2018-12-03 NOTE — Discharge Instructions (Signed)

## 2018-12-08 ENCOUNTER — Ambulatory Visit (INDEPENDENT_AMBULATORY_CARE_PROVIDER_SITE_OTHER): Payer: BLUE CROSS/BLUE SHIELD | Admitting: Family Medicine

## 2018-12-08 VITALS — BP 122/81 | HR 63 | Wt 245.4 lb

## 2018-12-08 DIAGNOSIS — Z3A30 30 weeks gestation of pregnancy: Secondary | ICD-10-CM

## 2018-12-08 DIAGNOSIS — O09513 Supervision of elderly primigravida, third trimester: Secondary | ICD-10-CM

## 2018-12-08 DIAGNOSIS — O0993 Supervision of high risk pregnancy, unspecified, third trimester: Secondary | ICD-10-CM

## 2018-12-08 DIAGNOSIS — O0990 Supervision of high risk pregnancy, unspecified, unspecified trimester: Secondary | ICD-10-CM

## 2018-12-08 DIAGNOSIS — O10913 Unspecified pre-existing hypertension complicating pregnancy, third trimester: Secondary | ICD-10-CM

## 2018-12-08 DIAGNOSIS — O10919 Unspecified pre-existing hypertension complicating pregnancy, unspecified trimester: Secondary | ICD-10-CM

## 2018-12-08 NOTE — Progress Notes (Signed)
Pt states since yesterday has had cramping on upper right abdomen. Also was told that she had a UTI & was given meds Peridium

## 2018-12-08 NOTE — Progress Notes (Signed)
   PRENATAL VISIT NOTE  Subjective:  Vanessa Braun is a 38 y.o. G4P0030 at [redacted]w[redacted]d being seen today for ongoing prenatal care.  She is currently monitored for the following issues for this high-risk pregnancy and has Obesity; Eczema of both hands; AMA (advanced maternal age) primigravida 69+; Supervision of high risk pregnancy, antepartum; Chronic hypertension during pregnancy, antepartum; Previous gastric bypass affecting pregnancy, antepartum; History of multiple miscarriages; and Vitamin D deficiency on their problem list.  Patient reports cramping RUQ, usually after eating. No N/V.  Contractions: Irritability. Vag. Bleeding: None.  Movement: Present. Denies leaking of fluid.   The following portions of the patient's history were reviewed and updated as appropriate: allergies, current medications, past family history, past medical history, past social history, past surgical history and problem list. Problem list updated.  Objective:   Vitals:   12/08/18 1152  BP: 122/81  Pulse: 63  Weight: 245 lb 6.4 oz (111.3 kg)    Fetal Status: Fetal Heart Rate (bpm): 154 Fundal Height: 30 cm Movement: Present     General:  Alert, oriented and cooperative. Patient is in no acute distress.  Skin: Skin is warm and dry. No rash noted.   Cardiovascular: Normal heart rate noted  Respiratory: Normal respiratory effort, no problems with respiration noted  Abdomen: Soft, gravid, appropriate for gestational age.  Pain/Pressure: Present     Pelvic: Cervical exam deferred        Extremities: Normal range of motion.  Edema: Mild pitting, slight indentation  Mental Status: Normal mood and affect. Normal behavior. Normal judgment and thought content.   Assessment and Plan:  Pregnancy: G4P0030 at [redacted]w[redacted]d  1. Supervision of High Risk Pregnancy -- prenatal record reviewed and UTD  2. Chronic Hypertension BP Readings from Last 3 Encounters:  12/08/18 122/81  12/03/18 122/67  12/01/18 126/73  --  continue to monitor -- reviewed return precautions, signs/symptoms of preeclampsia  -- has hollow-up U/S in 3-4 weeks   Preterm labor symptoms and general obstetric precautions including but not limited to vaginal bleeding, contractions, leaking of fluid and fetal movement were reviewed in detail with the patient. Please refer to After Visit Summary for other counseling recommendations.   Return in about 2 weeks (around 12/22/2018) for HROB.  Future Appointments  Date Time Provider Department Center  12/25/2018 11:30 AM WH-MFC [place] 1 WH-MFCUS MFC-US  12/25/2018  1:35 PM Allie Bossier, MD [facility] WOC   [place], [place]

## 2018-12-25 ENCOUNTER — Ambulatory Visit (HOSPITAL_COMMUNITY)
Admission: RE | Admit: 2018-12-25 | Discharge: 2018-12-25 | Disposition: A | Discharge status: Home / Self Care | Payer: BLUE CROSS/BLUE SHIELD | Source: Ambulatory Visit | Attending: Obstetrics & Gynecology | Admitting: Obstetrics & Gynecology

## 2018-12-25 ENCOUNTER — Encounter (HOSPITAL_COMMUNITY): Payer: Self-pay

## 2018-12-25 ENCOUNTER — Other Ambulatory Visit (HOSPITAL_COMMUNITY): Payer: Self-pay | Admitting: *Deleted

## 2018-12-25 ENCOUNTER — Ambulatory Visit (INDEPENDENT_AMBULATORY_CARE_PROVIDER_SITE_OTHER): Payer: BLUE CROSS/BLUE SHIELD | Admitting: Obstetrics & Gynecology

## 2018-12-25 ENCOUNTER — Ambulatory Visit (HOSPITAL_COMMUNITY): Payer: BLUE CROSS/BLUE SHIELD | Admitting: *Deleted

## 2018-12-25 VITALS — BP 113/71 | HR 59 | Wt 244.2 lb

## 2018-12-25 VITALS — BP 140/70 | HR 64 | Wt 243.2 lb

## 2018-12-25 DIAGNOSIS — O10919 Unspecified pre-existing hypertension complicating pregnancy, unspecified trimester: Secondary | ICD-10-CM

## 2018-12-25 DIAGNOSIS — O99842 Bariatric surgery status complicating pregnancy, second trimester: Secondary | ICD-10-CM | POA: Diagnosis not present

## 2018-12-25 DIAGNOSIS — O10013 Pre-existing essential hypertension complicating pregnancy, third trimester: Secondary | ICD-10-CM

## 2018-12-25 DIAGNOSIS — O99212 Obesity complicating pregnancy, second trimester: Secondary | ICD-10-CM | POA: Diagnosis not present

## 2018-12-25 DIAGNOSIS — O99843 Bariatric surgery status complicating pregnancy, third trimester: Secondary | ICD-10-CM

## 2018-12-25 DIAGNOSIS — O09513 Supervision of elderly primigravida, third trimester: Secondary | ICD-10-CM

## 2018-12-25 DIAGNOSIS — O99213 Obesity complicating pregnancy, third trimester: Secondary | ICD-10-CM

## 2018-12-25 DIAGNOSIS — O09523 Supervision of elderly multigravida, third trimester: Secondary | ICD-10-CM

## 2018-12-25 DIAGNOSIS — O99840 Bariatric surgery status complicating pregnancy, unspecified trimester: Secondary | ICD-10-CM

## 2018-12-25 DIAGNOSIS — Z3A33 33 weeks gestation of pregnancy: Secondary | ICD-10-CM

## 2018-12-25 DIAGNOSIS — O0990 Supervision of high risk pregnancy, unspecified, unspecified trimester: Secondary | ICD-10-CM

## 2018-12-25 DIAGNOSIS — O99810 Abnormal glucose complicating pregnancy: Secondary | ICD-10-CM

## 2018-12-25 DIAGNOSIS — Z148 Genetic carrier of other disease: Secondary | ICD-10-CM

## 2018-12-25 DIAGNOSIS — O09522 Supervision of elderly multigravida, second trimester: Secondary | ICD-10-CM

## 2018-12-25 DIAGNOSIS — Z6836 Body mass index (BMI) 36.0-36.9, adult: Secondary | ICD-10-CM

## 2018-12-25 DIAGNOSIS — O10913 Unspecified pre-existing hypertension complicating pregnancy, third trimester: Secondary | ICD-10-CM

## 2018-12-25 NOTE — Progress Notes (Signed)
   PRENATAL VISIT NOTE  Subjective:  Vanessa Braun is a 38 y.o. G4P0030 at [redacted]w[redacted]d being seen today for ongoing prenatal care.  She is currently monitored for the following issues for this high-risk pregnancy and has Obesity; Eczema of both hands; AMA (advanced maternal age) primigravida 46+; Supervision of high risk pregnancy, antepartum; Chronic hypertension during pregnancy, antepartum; Previous gastric bypass affecting pregnancy, antepartum; History of multiple miscarriages; and Vitamin D deficiency on their problem list.  Patient reports cramping for 2 weeks.  Contractions: Irritability. Vag. Bleeding: None.  Movement: Present. Denies leaking of fluid.   The following portions of the patient's history were reviewed and updated as appropriate: allergies, current medications, past family history, past medical history, past social history, past surgical history and problem list. Problem list updated.  Objective:   Vitals:   12/25/18 1330 12/25/18 1332  BP: (!) 146/81 140/70  Pulse: 67 64  Weight: 243 lb 3.2 oz (110.3 kg)     Fetal Status: Fetal Heart Rate (bpm): 146   Movement: Present     General:  Alert, oriented and cooperative. Patient is in no acute distress.  Skin: Skin is warm and dry. No rash noted.   Cardiovascular: Normal heart rate noted  Respiratory: Normal respiratory effort, no problems with respiration noted  Abdomen: Soft, gravid, appropriate for gestational age.  Pain/Pressure: Present     Pelvic: Cervical exam performed        Extremities: Normal range of motion.  Edema: Trace  Mental Status: Normal mood and affect. Normal behavior. Normal judgment and thought content.   Assessment and Plan:  Pregnancy: G4P0030 at [redacted]w[redacted]d  1. Supervision of high risk pregnancy, antepartum   2. Primigravida of advanced maternal age in third trimester   3. Class 2 severe obesity due to excess calories with serious comorbidity and body mass index (BMI) of 36.0 to 36.9 in  adult (HCC)   4. Chronic hypertension during pregnancy, antepartum - Her BP today at MFM was very good. - She denies any pre eclampsia symptoms  5. Previous gastric bypass affecting pregnancy, antepartum   Preterm labor symptoms and general obstetric precautions including but not limited to vaginal bleeding, contractions, leaking of fluid and fetal movement were reviewed in detail with the patient. Please refer to After Visit Summary for other counseling recommendations.  No follow-ups on file.  Future Appointments  Date Time Provider Department Center  01/15/2019  1:00 PM WH-MFC [place] 3 WH-MFCUS MFC-US  01/23/2019 11:00 AM WH-MFC [place] 3 WH-MFCUS MFC-US    Allie Bossier, MD

## 2019-01-01 ENCOUNTER — Ambulatory Visit (INDEPENDENT_AMBULATORY_CARE_PROVIDER_SITE_OTHER): Payer: BLUE CROSS/BLUE SHIELD | Admitting: *Deleted

## 2019-01-01 ENCOUNTER — Ambulatory Visit: Payer: Self-pay

## 2019-01-01 VITALS — BP 114/58 | HR 59 | Wt 244.6 lb

## 2019-01-01 DIAGNOSIS — Z3A34 34 weeks gestation of pregnancy: Secondary | ICD-10-CM

## 2019-01-01 DIAGNOSIS — O10919 Unspecified pre-existing hypertension complicating pregnancy, unspecified trimester: Secondary | ICD-10-CM

## 2019-01-01 DIAGNOSIS — O10913 Unspecified pre-existing hypertension complicating pregnancy, third trimester: Secondary | ICD-10-CM

## 2019-01-01 NOTE — Progress Notes (Signed)

## 2019-01-03 ENCOUNTER — Telehealth: Payer: Self-pay

## 2019-01-03 MED ORDER — PREPLUS 27-1 MG PO TABS: 1.0000 | ORAL_TABLET | Freq: Every day | ORAL | 6 refills | Status: DC

## 2019-01-03 NOTE — Telephone Encounter (Signed)
Pt request PNV.  PNV e-prescribed.

## 2019-01-08 ENCOUNTER — Encounter: Payer: Self-pay | Admitting: Obstetrics & Gynecology

## 2019-01-08 ENCOUNTER — Ambulatory Visit (INDEPENDENT_AMBULATORY_CARE_PROVIDER_SITE_OTHER): Payer: BLUE CROSS/BLUE SHIELD | Admitting: Obstetrics & Gynecology

## 2019-01-08 ENCOUNTER — Ambulatory Visit: Payer: Self-pay

## 2019-01-08 ENCOUNTER — Ambulatory Visit (INDEPENDENT_AMBULATORY_CARE_PROVIDER_SITE_OTHER): Payer: BLUE CROSS/BLUE SHIELD | Admitting: General Practice

## 2019-01-08 ENCOUNTER — Other Ambulatory Visit: Payer: Self-pay

## 2019-01-08 VITALS — BP 117/72 | HR 82 | Wt 242.0 lb

## 2019-01-08 DIAGNOSIS — O10919 Unspecified pre-existing hypertension complicating pregnancy, unspecified trimester: Secondary | ICD-10-CM

## 2019-01-08 DIAGNOSIS — Z3A35 35 weeks gestation of pregnancy: Secondary | ICD-10-CM

## 2019-01-08 DIAGNOSIS — O0990 Supervision of high risk pregnancy, unspecified, unspecified trimester: Secondary | ICD-10-CM

## 2019-01-08 DIAGNOSIS — O0993 Supervision of high risk pregnancy, unspecified, third trimester: Secondary | ICD-10-CM

## 2019-01-08 DIAGNOSIS — O09513 Supervision of elderly primigravida, third trimester: Secondary | ICD-10-CM

## 2019-01-08 DIAGNOSIS — O10913 Unspecified pre-existing hypertension complicating pregnancy, third trimester: Secondary | ICD-10-CM

## 2019-01-08 NOTE — Progress Notes (Signed)
Pt informed that the ultrasound is considered a limited OB ultrasound and is not intended to be a complete ultrasound exam.  Patient also informed that the ultrasound is not being completed with the intent of assessing for fetal or placental anomalies or any pelvic abnormalities.  Explained that the purpose of today's ultrasound is to assess for  BPP, presentation and AFI.  Patient acknowledges the purpose of the exam and the limitations of the study.    Chase Caller RN BSN 01/08/19

## 2019-01-08 NOTE — Progress Notes (Signed)
PRENATAL VISIT NOTE  Subjective:  Vanessa Braun is a 38 y.o. G4P0030 at [redacted]w[redacted]d being seen today for ongoing prenatal care.  She is currently monitored for the following issues for this high-risk pregnancy and has Obesity; Eczema of both hands; AMA (advanced maternal age) primigravida 64+; Supervision of high risk pregnancy, antepartum; Chronic hypertension during pregnancy, antepartum; Previous gastric bypass affecting pregnancy, antepartum; History of multiple miscarriages; and Vitamin D deficiency on their problem list.  Patient reports no complaints.  Contractions: Irritability. Vag. Bleeding: None.  Movement: Present. Denies leaking of fluid.   The following portions of the patient's history were reviewed and updated as appropriate: allergies, current medications, past family history, past medical history, past social history, past surgical history and problem list.   Objective:   Vitals:   01/08/19 0834  BP: 117/72  Pulse: 82  Weight: 242 lb (109.8 kg)    Fetal Status: Fetal Heart Rate (bpm): NST   Movement: Present     General:  Alert, oriented and cooperative. Patient is in no acute distress.  Skin: Skin is warm and dry. No rash noted.   Cardiovascular: Normal heart rate noted  Respiratory: Normal respiratory effort, no problems with respiration noted  Abdomen: Soft, gravid, appropriate for gestational age.  Pain/Pressure: Present     Pelvic: Cervical exam deferred        Extremities: Normal range of motion.  Edema: Trace  Mental Status: Normal mood and affect. Normal behavior. Normal judgment and thought content.   Imaging: [place] Mfm Ob Follow Up  Result Date: 12/25/2018 ----------------------------------------------------------------------  OBSTETRICS REPORT                       (Signed Final 12/25/2018 12:16 pm) ---------------------------------------------------------------------- Patient Info  ID #:       [id number]                          D.O.B.:  [birth year] (37  yrs)  Name:       Vanessa Braun             Visit Date: 12/25/2018 11:26 am ---------------------------------------------------------------------- Performed By  Performed By:     Birdena Crandall        Ref. Address:     [address]                                                             [place], [place]  [address]  Attending:        Noralee Space MD        [facility]  Referred By:      Tereso Newcomer MD ---------------------------------------------------------------------- Orders   #  Description                          Code         Ordered By   1  [place] MFM OB FOLLOW UP                  79038.33     Noralee Space  ----------------------------------------------------------------------   #  Order #                    Accession #                 Episode #   1  [id number]                  [id number]                  [id number]  ---------------------------------------------------------------------- Indications   Obesity complicating pregnancy, second         O99.212   trimester   Abnormal maternal glucose tolerance,           O99.810   antepartum   Pregnancy complicated by previous gastric      O99.842   bypass, antepartum, second trimester   Advanced maternal age multigravida 53+,        O12.522   second trimester (low risk Panorama)   Hypertension - Chronic/Pre-existing            O10.019   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 Cord Insertion:      Visualized, central  Amniotic Fluid  AFI FV:      Within normal limits  AFI Sum(cm)     %Tile       Largest Pocket(cm)  10.57           22  4.56  RUQ(cm)       RLQ(cm)       LUQ(cm)        LLQ(cm)  2.79          1.29          4.56           1.93 ---------------------------------------------------------------------- Biometry  BPD:      78.7  mm     G. Age:  31w 4d          8  %    CI:        73.84   %    70 - 86                                                          FL/HC:      20.0   %    19.9 - 21.5  HC:      290.9  mm     G. Age:  32w 1d          3  %    HC/AC:      1.07        0.96 - 1.11  AC:      272.4  mm     G. Age:  31w 2d          9  %    FL/BPD:     73.8   %    71 - 87  FL:       58.1  mm     G. Age:  30w 2d        < 3  %    FL/AC:      21.3   %    20 - 24  Est. FW:    1704  gm    3 lb 12 oz      22  % ---------------------------------------------------------------------- OB History  Gravidity:    4          SAB:   3 ---------------------------------------------------------------------- Gestational Age  LMP:           33w 1d        Date:  05/07/18                 EDD:   02/11/19  U/S Today:     31w 2d                                        EDD:   02/24/19  Best:          33w 1d     Det. By:  LMP  (05/07/18)          EDD:   02/11/19 ---------------------------------------------------------------------- Anatomy  Ventricles:            Appears normal         Kidneys:                Appear normal  Stomach:               Appears normal, left   Bladder:                Appears normal  sided ---------------------------------------------------------------------- Cervix Uterus Adnexa  Cervix  Not adaquately visualized ---------------------------------------------------------------------- Impression  Amniotic fluid is  normal and good fetal activity is seen. Fetal  growth is appropriate for gestational age. ---------------------------------------------------------------------- Recommendations  -Weekly BPP from 36 weeks.  -Fetal growth assessment in 3 weeks. ----------------------------------------------------------------------                  Noralee Space, MD Electronically Signed Final Report   12/25/2018 12:16 pm ----------------------------------------------------------------------  US Fetal Bpp W/nonstress  Result Date: 01/03/2019 ----------------------------------------------------------------------  OBSTETRICS REPORT                       (Signed Final 01/03/2019 07:57 am) ---------------------------------------------------------------------- Patient Info  ID #:       [id number]                          D.O.B.:  [birth year] (37 yrs)  Name:       Vanessa Braun             Visit Date: 01/01/2019 10:30 am ---------------------------------------------------------------------- Performed By  Performed By:     Sedalia Muta Day RNC          Ref. Address:     [address]                                                             [place], [place]                                                             [address]  Attending:        Jaynie Collins         [facility]                                                             [facility]  [place]  Referred By:      Tereso NewcomerUGONNA A                    Takenya Travaglini MD ---------------------------------------------------------------------- Orders   #  Description                          Code         Ordered By   1  US FETAL BPP W/NONSTRESS             [id number]      Jaynie CollinsUGONNA Yaiza Palazzola  ----------------------------------------------------------------------   #  Order #                    Accession #                  Episode #   1  [id number]                  [id number]                  [id number]  ---------------------------------------------------------------------- Service(s) Provided   US Fetal BPP W NST                                   [id number]  ---------------------------------------------------------------------- Indications   [redacted] weeks gestation of pregnancy                Z3A.34   Unspecified pre-existing hypertension          O10.913   complicating pregnancy, third trimester  ---------------------------------------------------------------------- Vital Signs                                                 Height:        5'9" ---------------------------------------------------------------------- Fetal Evaluation  Num Of Fetuses:         1  Preg. Location:         Intrauterine  Cardiac Activity:       Observed  Presentation:           Cephalic  Amniotic Fluid  AFI FV:      Within normal limits  AFI Sum(cm)     %Tile       Largest Pocket(cm)  13.45           45          4.22  RUQ(cm)       RLQ(cm)       LUQ(cm)        LLQ(cm)  3.98          3.43          1.82           4.22 ---------------------------------------------------------------------- Biophysical Evaluation  Amniotic F.V:   Pocket => 2 cm two         F. Tone:        Observed                  planes  F. Movement:    Observed                   N.S.T:          Reactive  F. Breathing:   Observed  Score:          10/10 ---------------------------------------------------------------------- OB History  Gravidity:    4          SAB:   3 ---------------------------------------------------------------------- Gestational Age  LMP:           34w 1d        Date:  05/07/18                 EDD:   02/11/19  Best:          34w 1d     Det. By:  LMP  (05/07/18)          EDD:   02/11/19 ---------------------------------------------------------------------- Impression  Antenatal testing is reassuring with BPP 10/10. Normal  amniotic fluid volume.  ---------------------------------------------------------------------- Recommendations  Continue weekly antenatal testing till delivery. ----------------------------------------------------------------------              Jaynie Collins, MD Electronically Signed Final Report   01/03/2019 07:57 am ----------------------------------------------------------------------   Assessment and Plan:  Pregnancy: G4P0030 at [redacted]w[redacted]d 1. Chronic hypertension during pregnancy, antepartum Stable BP today, no meds.  NST performed today was reviewed and was found to be reactive. Subsequent BPP performed today was also reviewed and was found to be 10/10. AFI was also normal. Continue recommended antenatal testing and prenatal care.  2. Supervision of high risk pregnancy, antepartum Patient was asking for letter to be "written out of work" due to her job requirements. She was told this could not be done at this point as she has no acute obstetrical reason to be out of work, but work restrictions letter will be provided.  Preterm labor symptoms and general obstetric precautions including but not limited to vaginal bleeding, contractions, leaking of fluid and fetal movement were reviewed in detail with the patient. Please refer to After Visit Summary for other counseling recommendations.   Return in about 1 week (around 01/15/2019) for NST, BPP, OB Visit (HOB).  Future Appointments  Date Time Provider Department Center  01/08/2019  9:15 AM Ladamien Rammel, Jethro Bastos, MD WOC-[facility]A WOC  01/08/2019  9:35 AM [facility] IMAGING WOC-C[facility]IMG WOC  01/15/2019 12:50 PM WH-MFC NURSE WH-MFC MFC-US  01/15/2019  1:00 PM WH-MFC [place] 3 WH-MFCUS MFC-US  01/15/2019  2:15 PM WOC-WOCA NST WOC-W[facility]A WOC  01/15/2019  3:15 PM Levie Heritage, DO WOC-[facility] WOC  01/23/2019 10:50 AM WH-MFC NURSE WH-MFC MFC-US  01/23/2019 11:00 AM WH-MFC [place] 3 WH-MFCUS MFC-US    Jaynie Collins, MD

## 2019-01-08 NOTE — Patient Instructions (Signed)
Return to office for any scheduled appointments. Call the office or go to the MAU at [facility] if:  You begin to have strong, frequent contractions  Your water breaks.  Sometimes it is a big gush of fluid, sometimes it is just a trickle that keeps getting your panties wet or running down your legs  You have vaginal bleeding.  It is normal to have a small amount of spotting if your cervix was checked.   You do not feel your baby moving like normal.  If you do not, get something to eat and drink and lay down and focus on feeling your baby move.   If your baby is still not moving like normal, you should call the office or go to MAU.  Any other obstetric concerns.   

## 2019-01-15 ENCOUNTER — Ambulatory Visit (INDEPENDENT_AMBULATORY_CARE_PROVIDER_SITE_OTHER): Payer: BLUE CROSS/BLUE SHIELD | Admitting: *Deleted

## 2019-01-15 ENCOUNTER — Other Ambulatory Visit (HOSPITAL_COMMUNITY)
Admission: RE | Admit: 2019-01-15 | Discharge: 2019-01-15 | Disposition: A | Discharge status: Home / Self Care | Payer: BLUE CROSS/BLUE SHIELD | Source: Ambulatory Visit | Attending: Family Medicine | Admitting: Family Medicine

## 2019-01-15 ENCOUNTER — Encounter: Payer: Self-pay | Admitting: Family Medicine

## 2019-01-15 ENCOUNTER — Encounter (HOSPITAL_COMMUNITY): Payer: Self-pay | Admitting: *Deleted

## 2019-01-15 ENCOUNTER — Ambulatory Visit (INDEPENDENT_AMBULATORY_CARE_PROVIDER_SITE_OTHER): Payer: BLUE CROSS/BLUE SHIELD | Admitting: Family Medicine

## 2019-01-15 ENCOUNTER — Ambulatory Visit (HOSPITAL_COMMUNITY): Payer: BLUE CROSS/BLUE SHIELD | Admitting: *Deleted

## 2019-01-15 ENCOUNTER — Other Ambulatory Visit (HOSPITAL_COMMUNITY): Payer: Self-pay | Admitting: Obstetrics and Gynecology

## 2019-01-15 ENCOUNTER — Other Ambulatory Visit: Payer: Self-pay

## 2019-01-15 ENCOUNTER — Ambulatory Visit (HOSPITAL_COMMUNITY)
Admission: RE | Admit: 2019-01-15 | Discharge: 2019-01-15 | Disposition: A | Discharge status: Home / Self Care | Payer: BLUE CROSS/BLUE SHIELD | Source: Ambulatory Visit | Attending: Obstetrics & Gynecology | Admitting: Obstetrics & Gynecology

## 2019-01-15 VITALS — BP 113/63 | HR 64 | Wt 244.3 lb

## 2019-01-15 DIAGNOSIS — E559 Vitamin D deficiency, unspecified: Secondary | ICD-10-CM | POA: Insufficient documentation

## 2019-01-15 DIAGNOSIS — O0990 Supervision of high risk pregnancy, unspecified, unspecified trimester: Secondary | ICD-10-CM

## 2019-01-15 DIAGNOSIS — Z3A36 36 weeks gestation of pregnancy: Secondary | ICD-10-CM

## 2019-01-15 DIAGNOSIS — O99840 Bariatric surgery status complicating pregnancy, unspecified trimester: Secondary | ICD-10-CM | POA: Insufficient documentation

## 2019-01-15 DIAGNOSIS — N96 Recurrent pregnancy loss: Secondary | ICD-10-CM | POA: Insufficient documentation

## 2019-01-15 DIAGNOSIS — O10013 Pre-existing essential hypertension complicating pregnancy, third trimester: Secondary | ICD-10-CM | POA: Diagnosis not present

## 2019-01-15 DIAGNOSIS — O10913 Unspecified pre-existing hypertension complicating pregnancy, third trimester: Secondary | ICD-10-CM | POA: Diagnosis not present

## 2019-01-15 DIAGNOSIS — Z364 Encounter for antenatal screening for fetal growth retardation: Secondary | ICD-10-CM

## 2019-01-15 DIAGNOSIS — O10919 Unspecified pre-existing hypertension complicating pregnancy, unspecified trimester: Secondary | ICD-10-CM

## 2019-01-15 DIAGNOSIS — O09513 Supervision of elderly primigravida, third trimester: Secondary | ICD-10-CM

## 2019-01-15 DIAGNOSIS — Z362 Encounter for other antenatal screening follow-up: Secondary | ICD-10-CM

## 2019-01-15 DIAGNOSIS — O99213 Obesity complicating pregnancy, third trimester: Secondary | ICD-10-CM

## 2019-01-15 DIAGNOSIS — O99843 Bariatric surgery status complicating pregnancy, third trimester: Secondary | ICD-10-CM

## 2019-01-15 LAB — OB RESULTS CONSOLE GBS: GBS: NEGATIVE

## 2019-01-15 LAB — OB RESULTS CONSOLE GC/CHLAMYDIA: Gonorrhea: NEGATIVE

## 2019-01-15 NOTE — Progress Notes (Signed)
   PRENATAL VISIT NOTE  Subjective:  Vanessa Braun is a 38 y.o. G4P0030 at [redacted]w[redacted]d being seen today for ongoing prenatal care.  She is currently monitored for the following issues for this high-risk pregnancy and has Obesity; Eczema of both hands; AMA (advanced maternal age) primigravida 77+; Supervision of high risk pregnancy, antepartum; Chronic hypertension during pregnancy, antepartum; Previous gastric bypass affecting pregnancy, antepartum; History of multiple miscarriages; and Vitamin D deficiency on their problem list.  Patient reports no complaints.  Contractions: Not present. Vag. Bleeding: None.  Movement: Present. Denies leaking of fluid.   The following portions of the patient's history were reviewed and updated as appropriate: allergies, current medications, past family history, past medical history, past social history, past surgical history and problem list.   Objective:   Vitals:   01/15/19 1450  BP: 113/63  Pulse: 64  Weight: 244 lb 4.8 oz (110.8 kg)    Fetal Status: Fetal Heart Rate (bpm): NST   Movement: Present     General:  Alert, oriented and cooperative. Patient is in no acute distress.  Skin: Skin is warm and dry. No rash noted.   Cardiovascular: Normal heart rate noted  Respiratory: Normal respiratory effort, no problems with respiration noted  Abdomen: Soft, gravid, appropriate for gestational age.  Pain/Pressure: Present     Pelvic: Cervical exam deferred        Extremities: Normal range of motion.     Mental Status: Normal mood and affect. Normal behavior. Normal judgment and thought content.   Assessment and Plan:  Pregnancy: G4P0030 at [redacted]w[redacted]d 1. Supervision of high risk pregnancy, antepartum FHT normal - GC/Chlamydia probe amp ([place])not at [facility] - Strep Gp B Culture+Rflx  2. Chronic hypertension during pregnancy, antepartum NST reactive SGA BPP 10/10 today Has bpp next week Had CHTN prior to gastric bypass  3. Primigravida of  advanced maternal age in third trimester   4. Previous gastric bypass affecting pregnancy, antepartum   Preterm labor symptoms and general obstetric precautions including but not limited to vaginal bleeding, contractions, leaking of fluid and fetal movement were reviewed in detail with the patient. Please refer to After Visit Summary for other counseling recommendations.   Return in about 8 days (around 01/23/2019) for [facility] w/ @ 0915 and add *Needs NST* in appt note - has [place] @ 1100.  Future Appointments  Date Time Provider Department Center  01/15/2019  3:15 PM Noralee Chars [facility] WOC  01/23/2019 10:50 AM WH-MFC NURSE WH-MFC MFC-US  01/23/2019 11:00 AM WH-MFC [place] 3 WH-MFCUS MFC-US    Levie Heritage, DO

## 2019-01-15 NOTE — Progress Notes (Signed)
[place] for growth, UA doppler and BPP done today @ [facility]FM - scheduled weekly.

## 2019-01-16 ENCOUNTER — Inpatient Hospital Stay (HOSPITAL_COMMUNITY)
Admission: AD | Admit: 2019-01-16 | Discharge: 2019-01-16 | Disposition: A | Discharge status: Home / Self Care | Payer: BLUE CROSS/BLUE SHIELD | Attending: Obstetrics and Gynecology | Admitting: Obstetrics and Gynecology

## 2019-01-16 ENCOUNTER — Encounter: Payer: Self-pay | Admitting: *Deleted

## 2019-01-16 ENCOUNTER — Inpatient Hospital Stay (HOSPITAL_BASED_OUTPATIENT_CLINIC_OR_DEPARTMENT_OTHER): Payer: BLUE CROSS/BLUE SHIELD

## 2019-01-16 ENCOUNTER — Encounter (HOSPITAL_COMMUNITY): Payer: Self-pay | Admitting: *Deleted

## 2019-01-16 ENCOUNTER — Other Ambulatory Visit: Payer: Self-pay

## 2019-01-16 DIAGNOSIS — Z3A36 36 weeks gestation of pregnancy: Secondary | ICD-10-CM | POA: Diagnosis not present

## 2019-01-16 DIAGNOSIS — Z880 Allergy status to penicillin: Secondary | ICD-10-CM | POA: Diagnosis not present

## 2019-01-16 DIAGNOSIS — E282 Polycystic ovarian syndrome: Secondary | ICD-10-CM | POA: Insufficient documentation

## 2019-01-16 DIAGNOSIS — O09513 Supervision of elderly primigravida, third trimester: Secondary | ICD-10-CM

## 2019-01-16 DIAGNOSIS — O365990 Maternal care for other known or suspected poor fetal growth, unspecified trimester, not applicable or unspecified: Secondary | ICD-10-CM

## 2019-01-16 DIAGNOSIS — O163 Unspecified maternal hypertension, third trimester: Secondary | ICD-10-CM | POA: Diagnosis not present

## 2019-01-16 DIAGNOSIS — O99840 Bariatric surgery status complicating pregnancy, unspecified trimester: Secondary | ICD-10-CM

## 2019-01-16 DIAGNOSIS — K219 Gastro-esophageal reflux disease without esophagitis: Secondary | ICD-10-CM | POA: Insufficient documentation

## 2019-01-16 DIAGNOSIS — E559 Vitamin D deficiency, unspecified: Secondary | ICD-10-CM

## 2019-01-16 DIAGNOSIS — O99843 Bariatric surgery status complicating pregnancy, third trimester: Secondary | ICD-10-CM | POA: Diagnosis not present

## 2019-01-16 DIAGNOSIS — O09523 Supervision of elderly multigravida, third trimester: Secondary | ICD-10-CM | POA: Diagnosis not present

## 2019-01-16 DIAGNOSIS — O368130 Decreased fetal movements, third trimester, not applicable or unspecified: Secondary | ICD-10-CM | POA: Diagnosis present

## 2019-01-16 DIAGNOSIS — O365930 Maternal care for other known or suspected poor fetal growth, third trimester, not applicable or unspecified: Secondary | ICD-10-CM

## 2019-01-16 DIAGNOSIS — O10013 Pre-existing essential hypertension complicating pregnancy, third trimester: Secondary | ICD-10-CM | POA: Diagnosis not present

## 2019-01-16 DIAGNOSIS — O10913 Unspecified pre-existing hypertension complicating pregnancy, third trimester: Secondary | ICD-10-CM

## 2019-01-16 DIAGNOSIS — O99213 Obesity complicating pregnancy, third trimester: Secondary | ICD-10-CM

## 2019-01-16 DIAGNOSIS — O10919 Unspecified pre-existing hypertension complicating pregnancy, unspecified trimester: Secondary | ICD-10-CM

## 2019-01-16 DIAGNOSIS — Z3689 Encounter for other specified antenatal screening: Secondary | ICD-10-CM

## 2019-01-16 DIAGNOSIS — O0990 Supervision of high risk pregnancy, unspecified, unspecified trimester: Secondary | ICD-10-CM

## 2019-01-16 DIAGNOSIS — O368190 Decreased fetal movements, unspecified trimester, not applicable or unspecified: Secondary | ICD-10-CM

## 2019-01-16 DIAGNOSIS — Z79899 Other long term (current) drug therapy: Secondary | ICD-10-CM | POA: Insufficient documentation

## 2019-01-16 DIAGNOSIS — Z9884 Bariatric surgery status: Secondary | ICD-10-CM | POA: Diagnosis not present

## 2019-01-16 DIAGNOSIS — N96 Recurrent pregnancy loss: Secondary | ICD-10-CM

## 2019-01-16 DIAGNOSIS — Z8249 Family history of ischemic heart disease and other diseases of the circulatory system: Secondary | ICD-10-CM | POA: Insufficient documentation

## 2019-01-16 HISTORY — DX: Essential (primary) hypertension: I10

## 2019-01-16 LAB — GC/CHLAMYDIA PROBE AMP (~~LOC~~) NOT AT ARMC
CHLAMYDIA, DNA PROBE: NEGATIVE
Neisseria Gonorrhea: NEGATIVE

## 2019-01-16 NOTE — MAU Note (Signed)
Presents with c/o Decreased FM since Sunday.  Denies LOF or VB.  States ate 15 minutes prior to arrival @ hospital.

## 2019-01-16 NOTE — MAU Provider Note (Addendum)
History     CSN: [id number]  Arrival date and time: 01/16/19 1209  Chief Complaint  Patient presents with  . Decreased Fetal Movement   G4P0030 @36 .2 wks presenting with decreased FM. Reports no FM since yesterday during her PNV. States baby using moves a lot at night but didn't last night. Denies VB, LOF, ctx. She had something to eat just before arrival, had just woken up prior to that. Her pregnancy is complicated by SGA, CHTN, hx gastric bypass, and AMA. Had BPP/NST yesterday in office with 10/10. Normal dopplers yesterday at MFM, baby is SGA.   OB History    Gravida  4   Para  0   Term  0   Preterm  0   AB  3   Living  0     SAB  3   TAB  0   Ectopic  0   Multiple  0   Live Births  0           Past Medical History:  Diagnosis Date  . Abnormal Pap smear   . Chronic dermatitis of hands   . Eczema   . GERD (gastroesophageal reflux disease)    lost weight with bypass surgery, no problems since  . Hypertension   . PCOS (polycystic ovarian syndrome)     Past Surgical History:  Procedure Laterality Date  . ADENOIDECTOMY    . DILATION AND CURETTAGE OF UTERUS    . DILATION AND EVACUATION N/A 04/01/2014   Procedure: DILATATION AND EVACUATION;  Surgeon: Robley Fries, MD;  Location: [facility];  Service: Gynecology;  Laterality: N/A;  . DILATION AND EVACUATION N/A 12/23/2017   Procedure: DILATATION AND EVACUATION;  Surgeon: Willodean Rosenthal, MD;  Location: [facility];  Service: Gynecology;  Laterality: N/A;  . GASTRIC BYPASS    . TONSILLECTOMY AND ADENOIDECTOMY  1999    Family History  Problem Relation Age of Onset  . Diabetes Mother   . Hyperlipidemia Mother   . Hypertension Mother   . Diabetes Father   . Kidney disease Father   . Hypertension Father   . Hyperlipidemia Father   . Cancer Father        pancreatic  . Gestational diabetes Sister   . Depression Sister   . Diabetes Sister   . Heart disease Maternal Grandmother   . Heart disease  Paternal Grandmother     Social History   Tobacco Use  . Smoking status: Never Smoker  . Smokeless tobacco: Never Used  Substance Use Topics  . Alcohol use: Not Currently    Frequency: Never  . Drug use: No    Allergies:  Allergies  Allergen Reactions  . Other     "anything antibacterial"; some creams for dermatitis  . Penicillins Itching and Rash    Medications Prior to Admission  Medication Sig Dispense Refill Last Dose  . Prenatal Vit-Fe Fumarate-FA (PREPLUS) 27-1 MG TABS Take 1 tablet by mouth daily. 30 tablet 6 Past Week at Unknown time  . diphenhydrAMINE HCl (BENADRYL ALLERGY PO) Take by mouth.   More than a month at Unknown time  . promethazine (PHENERGAN) 12.5 MG tablet Take 1 tablet (12.5 mg total) by mouth every 6 (six) hours as needed for nausea or vomiting. (Patient not taking: Reported on 01/15/2019) 30 tablet 0 Not Taking  . Vitamin D, Ergocalciferol, (DRISDOL) 1.25 MG (50000 UT) CAPS capsule Take 1 capsule (50,000 Units total) by mouth every 7 (seven) days. (Patient not taking: Reported on 01/15/2019)  8 capsule 0 Not Taking    Review of Systems  Constitutional: Negative.   Gastrointestinal: Negative.   Genitourinary: Negative.    Physical Exam   Blood pressure 129/69, pulse 100, temperature 98.4 F (36.9 C), temperature source Oral, resp. rate 20, last menstrual period 05/07/2018, SpO2 98 %, unknown if currently breastfeeding.  Physical Exam  Constitutional: She is oriented to person, place, and time. She appears well-developed and well-nourished. No distress.  HENT:  Head: Normocephalic and atraumatic.  Neck: Normal range of motion.  Respiratory: No respiratory distress.  GI: Soft. She exhibits no distension. There is no abdominal tenderness.  gravid  Musculoskeletal: Normal range of motion.  Neurological: She is alert and oriented to person, place, and time.  Skin: Skin is warm and dry.  Psychiatric: She has a normal mood and affect.  EFM: 145 bpm,  mod variability, + accels, rare mild variable decel Toco: rare  MAU Course  Procedures  MDM NST reactive. BPP ordered. BPP 8/8, AFI 13cm. Reports feeling FM while in [place]. Stable for discharge home.   Assessment and Plan  [redacted] weeks gestation Reactive NST Decreased fetal movement Discharge home Follow up at [facility] as scheduled Eat and drink often FMCs  Allergies as of 01/16/2019      Reactions   Other    "anything antibacterial"; some creams for dermatitis   Penicillins Itching, Rash      Medication List    TAKE these medications   BENADRYL ALLERGY PO Take by mouth.   PrePLUS 27-1 MG Tabs Take 1 tablet by mouth daily.   promethazine 12.5 MG tablet Commonly known as:  PHENERGAN Take 1 tablet (12.5 mg total) by mouth every 6 (six) hours as needed for nausea or vomiting.   Vitamin D (Ergocalciferol) 1.25 MG (50000 UT) Caps capsule Commonly known as:  DRISDOL Take 1 capsule (50,000 Units total) by mouth every 7 (seven) days.      Donette Larry, CNM 01/16/2019, 2:29 PM

## 2019-01-16 NOTE — Discharge Instructions (Signed)

## 2019-01-19 LAB — STREP GP B CULTURE+RFLX: Strep Gp B Culture+Rflx: NEGATIVE

## 2019-01-23 ENCOUNTER — Other Ambulatory Visit (HOSPITAL_COMMUNITY): Payer: Self-pay | Admitting: *Deleted

## 2019-01-23 ENCOUNTER — Telehealth: Payer: Self-pay | Admitting: Family Medicine

## 2019-01-23 ENCOUNTER — Encounter (HOSPITAL_COMMUNITY): Payer: Self-pay

## 2019-01-23 ENCOUNTER — Ambulatory Visit (HOSPITAL_COMMUNITY)
Admission: RE | Admit: 2019-01-23 | Discharge: 2019-01-23 | Disposition: A | Discharge status: Home / Self Care | Payer: BLUE CROSS/BLUE SHIELD | Source: Ambulatory Visit | Attending: Obstetrics and Gynecology | Admitting: Obstetrics and Gynecology

## 2019-01-23 ENCOUNTER — Other Ambulatory Visit: Payer: Self-pay

## 2019-01-23 ENCOUNTER — Encounter: Payer: BLUE CROSS/BLUE SHIELD | Admitting: Family Medicine

## 2019-01-23 ENCOUNTER — Ambulatory Visit (HOSPITAL_COMMUNITY): Payer: BLUE CROSS/BLUE SHIELD | Admitting: *Deleted

## 2019-01-23 VITALS — BP 134/84 | HR 73 | Temp 97.7°F

## 2019-01-23 DIAGNOSIS — O09513 Supervision of elderly primigravida, third trimester: Secondary | ICD-10-CM | POA: Diagnosis not present

## 2019-01-23 DIAGNOSIS — Z3A37 37 weeks gestation of pregnancy: Secondary | ICD-10-CM

## 2019-01-23 DIAGNOSIS — O365930 Maternal care for other known or suspected poor fetal growth, third trimester, not applicable or unspecified: Secondary | ICD-10-CM

## 2019-01-23 DIAGNOSIS — O10013 Pre-existing essential hypertension complicating pregnancy, third trimester: Secondary | ICD-10-CM | POA: Diagnosis not present

## 2019-01-23 DIAGNOSIS — O10919 Unspecified pre-existing hypertension complicating pregnancy, unspecified trimester: Secondary | ICD-10-CM | POA: Insufficient documentation

## 2019-01-23 DIAGNOSIS — O09523 Supervision of elderly multigravida, third trimester: Secondary | ICD-10-CM | POA: Diagnosis present

## 2019-01-23 DIAGNOSIS — O368130 Decreased fetal movements, third trimester, not applicable or unspecified: Secondary | ICD-10-CM | POA: Diagnosis not present

## 2019-01-23 DIAGNOSIS — O99213 Obesity complicating pregnancy, third trimester: Secondary | ICD-10-CM | POA: Diagnosis not present

## 2019-01-23 DIAGNOSIS — O10913 Unspecified pre-existing hypertension complicating pregnancy, third trimester: Secondary | ICD-10-CM

## 2019-01-23 NOTE — Telephone Encounter (Signed)
The patient visited our clinic upset that her appointment was rescheduled. Explained the importance of keeping our patients safe and the reason for rescheduling. The patient was continued with we are supposed to schedule her induction after the 5th after she is 38 weeks. Informed the patient I would reach out to a nurse as no one was available at the time and we will give her a call back

## 2019-01-23 NOTE — Telephone Encounter (Signed)
Contacted pt and pt stated that she was suppose to have her IOL scheduled at 38 wks.  Per chart review MFM recommended IOL btwn 38-39 wks.  I explained to the pt that I will get with a provider tomorrow to finalize her IOL. Pt stated that she wanted to be closer to the 38 wk mark.  I explained to the pt that we that in mind however I will call her tomorrow with provider's recommendation.  Pt agreed.

## 2019-01-24 ENCOUNTER — Telehealth: Payer: Self-pay | Admitting: *Deleted

## 2019-01-24 NOTE — Telephone Encounter (Signed)
Consult completed with Dr. Shawnie Pons regarding timing of induction of labor (IOL). Pt was called and informed of plan of care to include induction of labor on 4/5 @ 0800. Pt instructed to go to [facility] @ 0745 that Hector Venne. Ultrasound for 4/6 will be cancelled.  Pt agreed to plan of care and voiced understanding of all information and instructions given.

## 2019-01-25 ENCOUNTER — Telehealth: Payer: Self-pay | Admitting: Family Medicine

## 2019-01-25 NOTE — Addendum Note (Signed)
Addended by: Reva Bores on: 01/25/2019 08:31 AM   Modules accepted: Orders, SmartSet

## 2019-01-25 NOTE — Telephone Encounter (Signed)
The patient called to verify the time of the lactation appointment.

## 2019-01-26 ENCOUNTER — Other Ambulatory Visit: Payer: Self-pay | Admitting: Advanced Practice Midwife

## 2019-01-26 ENCOUNTER — Encounter (HOSPITAL_COMMUNITY): Payer: Self-pay | Admitting: *Deleted

## 2019-01-26 ENCOUNTER — Telehealth (HOSPITAL_COMMUNITY): Payer: Self-pay | Admitting: *Deleted

## 2019-01-26 ENCOUNTER — Other Ambulatory Visit (HOSPITAL_COMMUNITY): Payer: Self-pay | Admitting: *Deleted

## 2019-01-26 NOTE — Telephone Encounter (Signed)
Preadmission screen  

## 2019-01-28 ENCOUNTER — Inpatient Hospital Stay (HOSPITAL_COMMUNITY): Payer: BLUE CROSS/BLUE SHIELD

## 2019-01-28 ENCOUNTER — Other Ambulatory Visit: Payer: Self-pay

## 2019-01-28 ENCOUNTER — Encounter (HOSPITAL_COMMUNITY): Payer: Self-pay | Admitting: *Deleted

## 2019-01-28 ENCOUNTER — Inpatient Hospital Stay (HOSPITAL_COMMUNITY)
Admission: AD | Admit: 2019-01-28 | Discharge: 2019-02-02 | DRG: 788 | Disposition: A | Discharge status: Home / Self Care | Payer: BLUE CROSS/BLUE SHIELD | Attending: Family Medicine | Admitting: Family Medicine

## 2019-01-28 DIAGNOSIS — O99844 Bariatric surgery status complicating childbirth: Secondary | ICD-10-CM | POA: Diagnosis present

## 2019-01-28 DIAGNOSIS — E559 Vitamin D deficiency, unspecified: Secondary | ICD-10-CM

## 2019-01-28 DIAGNOSIS — N96 Recurrent pregnancy loss: Secondary | ICD-10-CM

## 2019-01-28 DIAGNOSIS — O99214 Obesity complicating childbirth: Secondary | ICD-10-CM | POA: Diagnosis present

## 2019-01-28 DIAGNOSIS — Z98891 History of uterine scar from previous surgery: Secondary | ICD-10-CM

## 2019-01-28 DIAGNOSIS — O1002 Pre-existing essential hypertension complicating childbirth: Secondary | ICD-10-CM | POA: Diagnosis present

## 2019-01-28 DIAGNOSIS — Z3A38 38 weeks gestation of pregnancy: Secondary | ICD-10-CM | POA: Diagnosis not present

## 2019-01-28 DIAGNOSIS — O99840 Bariatric surgery status complicating pregnancy, unspecified trimester: Secondary | ICD-10-CM

## 2019-01-28 DIAGNOSIS — O09519 Supervision of elderly primigravida, unspecified trimester: Secondary | ICD-10-CM

## 2019-01-28 DIAGNOSIS — O365930 Maternal care for other known or suspected poor fetal growth, third trimester, not applicable or unspecified: Secondary | ICD-10-CM | POA: Diagnosis present

## 2019-01-28 DIAGNOSIS — O10919 Unspecified pre-existing hypertension complicating pregnancy, unspecified trimester: Secondary | ICD-10-CM

## 2019-01-28 DIAGNOSIS — O0990 Supervision of high risk pregnancy, unspecified, unspecified trimester: Secondary | ICD-10-CM

## 2019-01-28 LAB — TYPE AND SCREEN
ABO/RH(D): AB POS
Antibody Screen: NEGATIVE

## 2019-01-28 LAB — COMPREHENSIVE METABOLIC PANEL
ALT: 13 U/L (ref 0–44)
AST: 19 U/L (ref 15–41)
Albumin: 2.6 g/dL — ABNORMAL LOW (ref 3.5–5.0)
Alkaline Phosphatase: 492 U/L — ABNORMAL HIGH (ref 38–126)
Anion gap: 7 (ref 5–15)
BUN: 9 mg/dL (ref 6–20)
CO2: 20 mmol/L — ABNORMAL LOW (ref 22–32)
Calcium: 8.6 mg/dL — ABNORMAL LOW (ref 8.9–10.3)
Chloride: 107 mmol/L (ref 98–111)
Creatinine, Ser: 0.72 mg/dL (ref 0.44–1.00)
GFR calc Af Amer: >60 mL/min (ref >60)
GFR calc non Af Amer: >60 mL/min (ref >60)
Glucose, Bld: 102 mg/dL — ABNORMAL HIGH (ref 70–99)
Potassium: 3.9 mmol/L (ref 3.5–5.1)
Sodium: 134 mmol/L — ABNORMAL LOW (ref 135–145)
Total Bilirubin: 0.3 mg/dL (ref 0.3–1.2)
Total Protein: 5.8 g/dL — ABNORMAL LOW (ref 6.5–8.1)

## 2019-01-28 LAB — PROTEIN / CREATININE RATIO, URINE
Creatinine, Urine: 37.76 mg/dL
Total Protein, Urine: <6 mg/dL

## 2019-01-28 LAB — CBC
HCT: 32.5 % — ABNORMAL LOW (ref 36.0–46.0)
Hemoglobin: 10.6 g/dL — ABNORMAL LOW (ref 12.0–15.0)
MCH: 29.5 pg (ref 26.0–34.0)
MCHC: 32.6 g/dL (ref 30.0–36.0)
MCV: 90.5 fL (ref 80.0–100.0)
Platelets: 155 10*3/uL (ref 150–400)
RBC: 3.59 MIL/uL — ABNORMAL LOW (ref 3.87–5.11)
RDW: 12.8 % (ref 11.5–15.5)
WBC: 7.7 10*3/uL (ref 4.0–10.5)
nRBC: 0 % (ref 0.0–0.2)

## 2019-01-28 MED ORDER — LIDOCAINE HCL (PF) 1 % IJ SOLN
30.0000 mL | INTRAMUSCULAR | Status: DC | PRN
  Filled 2019-01-28: qty 30

## 2019-01-28 MED ORDER — PROMETHAZINE HCL 25 MG/ML IJ SOLN: 12.5000 mg | Freq: Four times a day (QID) | INTRAMUSCULAR | Status: DC | PRN

## 2019-01-28 MED ORDER — MISOPROSTOL 50MCG HALF TABLET
50.0000 ug | ORAL_TABLET | ORAL | Status: DC | PRN
  Administered 2019-01-28 – 2019-01-29 (×2): 50 ug via BUCCAL
  Filled 2019-01-28 (×2): qty 1

## 2019-01-28 MED ORDER — SOD CITRATE-CITRIC ACID 500-334 MG/5ML PO SOLN
30.0000 mL | ORAL | Status: DC | PRN
  Administered 2019-01-30: 30 mL via ORAL
  Filled 2019-01-28 (×2): qty 15

## 2019-01-28 MED ORDER — TERBUTALINE SULFATE 1 MG/ML IJ SOLN
0.2500 mg | Freq: Once | INTRAMUSCULAR | Status: AC | PRN
  Administered 2019-01-30: 0.25 mg via SUBCUTANEOUS
  Filled 2019-01-28: qty 1

## 2019-01-28 MED ORDER — PROMETHAZINE HCL 25 MG/ML IJ SOLN
12.5000 mg | Freq: Once | INTRAMUSCULAR | Status: AC
  Administered 2019-01-29: 12.5 mg via INTRAVENOUS
  Filled 2019-01-28: qty 1

## 2019-01-28 MED ORDER — MISOPROSTOL 25 MCG QUARTER TABLET
25.0000 ug | ORAL_TABLET | ORAL | Status: DC | PRN
  Administered 2019-01-28 (×3): 25 ug via VAGINAL
  Filled 2019-01-28 (×3): qty 1

## 2019-01-28 MED ORDER — ACETAMINOPHEN 325 MG PO TABS
650.0000 mg | ORAL_TABLET | ORAL | Status: DC | PRN
  Administered 2019-01-29 (×2): 650 mg via ORAL
  Filled 2019-01-28 (×2): qty 2

## 2019-01-28 MED ORDER — OXYCODONE-ACETAMINOPHEN 5-325 MG PO TABS: 2.0000 | ORAL_TABLET | ORAL | Status: DC | PRN

## 2019-01-28 MED ORDER — OXYCODONE-ACETAMINOPHEN 5-325 MG PO TABS: 1.0000 | ORAL_TABLET | ORAL | Status: DC | PRN

## 2019-01-28 MED ORDER — FLEET ENEMA 7-19 GM/118ML RE ENEM: 1.0000 | ENEMA | RECTAL | Status: DC | PRN

## 2019-01-28 MED ORDER — OXYTOCIN 40 UNITS IN NORMAL SALINE INFUSION - SIMPLE MED: 2.5000 [IU]/h | INTRAVENOUS | Status: DC

## 2019-01-28 MED ORDER — ONDANSETRON HCL 4 MG/2ML IJ SOLN
4.0000 mg | Freq: Four times a day (QID) | INTRAMUSCULAR | Status: DC | PRN
  Administered 2019-01-30: 4 mg via INTRAVENOUS

## 2019-01-28 MED ORDER — LACTATED RINGERS IV SOLN
INTRAVENOUS | Status: DC
  Administered 2019-01-28 – 2019-01-30 (×7): via INTRAVENOUS

## 2019-01-28 MED ORDER — OXYTOCIN BOLUS FROM INFUSION: 500.0000 mL | Freq: Once | INTRAVENOUS | Status: DC

## 2019-01-28 MED ORDER — LACTATED RINGERS IV SOLN: 500.0000 mL | INTRAVENOUS | Status: DC | PRN

## 2019-01-28 MED ORDER — FENTANYL CITRATE (PF) 100 MCG/2ML IJ SOLN
100.0000 ug | INTRAMUSCULAR | Status: DC | PRN
  Administered 2019-01-28 (×4): 100 ug via INTRAVENOUS
  Filled 2019-01-28 (×4): qty 2

## 2019-01-28 NOTE — H&P (Addendum)
LABOR AND DELIVERY ADMISSION HISTORY AND PHYSICAL NOTE  Vanessa Braun is a 38 y.o. female G4P0030 with IUP at [redacted]w[redacted]d by LMP c/w 7 week sono presenting for IOL for cHTN with suboptimal fetal growth (EFW 11% on 3/23/202) with poor weight gain between fetal growth [place]. MFM recommendation to delivery between 38-39 wks per 01/15/2019  She reports positive fetal movement. She denies leakage of fluid or vaginal bleeding.  Prenatal History/Complications: PNC at [facility]  Pregnancy complications:  - cHTN, no medications  -AMA  -h/o gastric bypass  -Vit D Deficiency   Past Medical History: Past Medical History:  Diagnosis Date  . Abnormal Pap smear   . Chronic dermatitis of hands   . Complication of anesthesia    trouble waking up  . Eczema   . GERD (gastroesophageal reflux disease)    lost weight with bypass surgery, no problems since  . Hypertension   . PCOS (polycystic ovarian syndrome)     Past Surgical History: Past Surgical History:  Procedure Laterality Date  . ADENOIDECTOMY    . DILATION AND CURETTAGE OF UTERUS    . DILATION AND EVACUATION N/A 04/01/2014   Procedure: DILATATION AND EVACUATION;  Surgeon: Robley Fries, MD;  Location: [facility];  Service: Gynecology;  Laterality: N/A;  . DILATION AND EVACUATION N/A 12/23/2017   Procedure: DILATATION AND EVACUATION;  Surgeon: Willodean Rosenthal, MD;  Location: [facility];  Service: Gynecology;  Laterality: N/A;  . GASTRIC BYPASS    . TONSILLECTOMY AND ADENOIDECTOMY  1999    Obstetrical History: OB History    Gravida  4   Para  0   Term  0   Preterm  0   AB  3   Living  0     SAB  3   TAB  0   Ectopic  0   Multiple  0   Live Births  0           Social History: Social History   Socioeconomic History  . Marital status: Legally Separated    Spouse name: Not on file  . Number of children: Not on file  . Years of education: Not on file  . Highest education level: Not on file  Occupational History  .  Not on file  Social Needs  . Financial resource strain: Not hard at all  . Food insecurity:    Worry: Never true    Inability: Never true  . Transportation needs:    Medical: No    Non-medical: No  Tobacco Use  . Smoking status: Never Smoker  . Smokeless tobacco: Never Used  Substance and Sexual Activity  . Alcohol use: Not Currently    Frequency: Never  . Drug use: No  . Sexual activity: Yes    Birth control/protection: None    Comment: last IC-two days ago  Lifestyle  . Physical activity:    Days per week: Not on file    Minutes per session: Not on file  . Stress: Only a little  Relationships  . Social connections:    Talks on phone: Not on file    Gets together: Not on file    Attends religious service: Not on file    Active member of club or organization: Not on file    Attends meetings of clubs or organizations: Not on file    Relationship status: Not on file  Other Topics Concern  . Not on file  Social History Narrative  . Not on file  Family History: Family History  Problem Relation Age of Onset  . Diabetes Mother   . Hyperlipidemia Mother   . Hypertension Mother   . Diabetes Father   . Kidney disease Father   . Hypertension Father   . Hyperlipidemia Father   . Cancer Father        pancreatic  . Gestational diabetes Sister   . Depression Sister   . Diabetes Sister   . Heart disease Maternal Grandmother   . Heart disease Paternal Grandmother     Allergies: Allergies  Allergen Reactions  . Other     "anything antibacterial"; some creams for dermatitis  . Penicillins Itching and Rash    Medications Prior to Admission  Medication Sig Dispense Refill Last Dose  . diphenhydrAMINE HCl (BENADRYL ALLERGY PO) Take by mouth.   Not Taking  . Prenatal Vit-Fe Fumarate-FA (PREPLUS) 27-1 MG TABS Take 1 tablet by mouth daily. 30 tablet 6 Taking  . promethazine (PHENERGAN) 12.5 MG tablet Take 1 tablet (12.5 mg total) by mouth every 6 (six) hours as needed  for nausea or vomiting. (Patient not taking: Reported on 01/15/2019) 30 tablet 0 Not Taking  . Vitamin D, Ergocalciferol, (DRISDOL) 1.25 MG (50000 UT) CAPS capsule Take 1 capsule (50,000 Units total) by mouth every 7 (seven) days. (Patient not taking: Reported on 01/15/2019) 8 capsule 0 Not Taking     Review of Systems  All systems reviewed and negative except as stated in HPI  Physical Exam Blood pressure 114/69, pulse 71, temperature 98.3 F (36.8 C), temperature source Oral, resp. rate 18, height  (1.753 m), weight 110.7 kg, last menstrual period 05/07/2018, unknown if currently breastfeeding. General appearance: alert, oriented, NAD Lungs: normal respiratory effort Heart: regular rate Abdomen: soft, non-tender; gravid, FH appropriate for GA Extremities: No calf swelling or tenderness Presentation: cephalic Fetal monitoring: 135 bpm, moderate variability, +acels, no decels  Uterine activity: None  Dilation: Closed Effacement (%): Thick Station: -3 Exam by:: Camelia Eng, RN/H. Christell Constant, RN  Prenatal labs: ABO, Rh: --/--/PENDING (04/05 4098) Antibody: PENDING (04/05 0848) Rubella: 2.73 (09/27 1001) RPR: Non Reactive (01/10 0915)  HBsAg: Negative (09/27 1001)  HIV: Non Reactive (01/10 0915)  GC/Chlamydia: Negative  GBS: Negative (03/23 0000)  2-hr GTT: Normal  Genetic screening:  Negative  Anatomy US: Normal   Prenatal Transfer Tool  Maternal Diabetes: No Genetic Screening: Normal Maternal Ultrasounds/Referrals: Normal Fetal Ultrasounds or other Referrals:  Referred to Materal Fetal Medicine  Maternal Substance Abuse:  No Significant Maternal Medications:  None Significant Maternal Lab Results: Lab values include: Group B Strep negative  Results for orders placed or performed during the hospital encounter of 01/28/19 (from the past 24 hour(s))  CBC   Collection Time: 01/28/19  8:48 AM  Result Value Ref Range   WBC 7.7 4.0 - 10.5 K/uL   RBC 3.59 (L) 3.87 - 5.11 MIL/uL    Hemoglobin 10.6 (L) 12.0 - 15.0 g/dL   HCT 11.9 (L) 14.7 - 82.9 %   MCV 90.5 80.0 - 100.0 fL   MCH 29.5 26.0 - 34.0 pg   MCHC 32.6 30.0 - 36.0 g/dL   RDW 56.2 13.0 - 86.5 %   Platelets 155 150 - 400 K/uL   nRBC 0.0 0.0 - 0.2 %  Type and screen   Collection Time: 01/28/19  8:48 AM  Result Value Ref Range   ABO/RH(D) PENDING    Antibody Screen PENDING    Sample Expiration      01/31/2019 Performed  at [facility], [address], [place], [place] [address]     Patient Active Problem List   Diagnosis Date Noted  . Hypertension in pregnancy, antepartum 01/28/2019  . Vitamin D deficiency 07/25/2018  . Supervision of high risk pregnancy, antepartum 07/21/2018  . Chronic hypertension during pregnancy, antepartum 07/21/2018  . Previous gastric bypass affecting pregnancy, antepartum 07/21/2018  . 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 AMA (advanced maternal age) primigravida 35+ 07/02/2018  . Eczema of both hands 08/16/2014  . Obesity 02/11/2012    Assessment: Thedora Pancoast is a 38 y.o. G4P0030 at [redacted]w[redacted]d here for IOL for cHTN with supoptimal fetal growth at term (see [place] on 01/15/2019 with recommendation for IOL between 38-39). BPs stable.   #Labor: Induction. Cervix not favorable at admission. Will start induction with Cytotec.  #Pain: Plans to not get epidural.  #FWB: Cat I  #ID:  GBS neg  #MOF: Breast  #MOC: Declines  #Circ:  N/A   De Hollingshead 01/28/2019, 9:21 AM

## 2019-01-28 NOTE — Progress Notes (Signed)
LABOR PROGRESS NOTE  Ciara Soulia is a 38 y.o. G4P0030 at [redacted]w[redacted]d  admitted for IOL for cHTN.   Subjective: Strip note.   Objective: BP 132/66   Pulse 63   Temp 98.3 F (36.8 C) (Oral)   Resp 18   Ht 5\' 9"  (1.753 m)   Wt 110.7 kg   LMP 05/07/2018   BMI 36.03 kg/m  or  Vitals:   01/28/19 1120 01/28/19 1238 01/28/19 1333 01/28/19 1422  BP: 130/75 (!) 117/51 125/77 132/66  Pulse: (!) 59 81 61 63  Resp: 18 20 20 18   Temp:  98.3 F (36.8 C)    TempSrc:  Oral    Weight:      Height:        Dilation: Closed Effacement (%): Thick Station: -3 Presentation: Vertex Exam by:: M. Early, RN FHT: baseline rate 150, moderate varibility, +acel, no decel Toco: Irritability   Labs: Lab Results  Component Value Date   WBC 7.7 01/28/2019   HGB 10.6 (L) 01/28/2019   HCT 32.5 (L) 01/28/2019   MCV 90.5 01/28/2019   PLT 155 01/28/2019    Patient Active Problem List   Diagnosis Date Noted  . Hypertension in pregnancy, antepartum 01/28/2019  . Vitamin D deficiency 07/25/2018  . Supervision of high risk pregnancy, antepartum 07/21/2018  . Chronic hypertension during pregnancy, antepartum 07/21/2018  . Previous gastric bypass affecting pregnancy, antepartum 07/21/2018  . 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 AMA (advanced maternal age) primigravida 35+ 07/02/2018  . Eczema of both hands 08/16/2014  . Obesity 02/11/2012    Assessment / Plan: 38 y.o. G4P0030 at [redacted]w[redacted]d here for IOL for cHTN. BPs slightly elevated at times but no severe range pressures. Will obtain PIH labs.   Labor: Induction. Cervix remains closed. 2nd cytotec given at 1330.  Fetal Wellbeing:  Cat I  Pain Control:  Maternal support  Anticipated MOD:  NSVD   Marcy Siren, D.O. OB Fellow  01/28/2019, 2:35 PM

## 2019-01-28 NOTE — Progress Notes (Signed)
LABOR PROGRESS NOTE  Vanessa Braun is a 38 y.o. G4P0030 at [redacted]w[redacted]d  admitted for IOL for CHTN  Subjective: Patient doing well, reports occasional cramping- using IV Fentanyl for pain management   Objective: BP 117/64   Pulse 65   Temp 98.2 F (36.8 C) (Oral)   Resp 18   Ht 5\' 9"  (1.753 m)   Wt 110.7 kg   LMP 05/07/2018   BMI 36.03 kg/m  or  Vitals:   01/28/19 1743 01/28/19 1756 01/28/19 1900 01/28/19 2055  BP: (!) 163/98 (!) 146/94 (!) 152/85 117/64  Pulse: 73 65 (!) 59 65  Resp: 18 18 18    Temp:  98.2 F (36.8 C)    TempSrc:  Oral    Weight:      Height:        FB placed @ 2224 Dilation: 1 Effacement (%): Thick Station: -3 Presentation: Vertex Exam by:: CNM V. Rodgers FHT: baseline rate 130, moderate varibility, +accel, no decel Toco: UI  Labs: Lab Results  Component Value Date   WBC 7.7 01/28/2019   HGB 10.6 (L) 01/28/2019   HCT 32.5 (L) 01/28/2019   MCV 90.5 01/28/2019   PLT 155 01/28/2019    Patient Active Problem List   Diagnosis Date Noted  . Hypertension in pregnancy, antepartum 01/28/2019  . Vitamin D deficiency 07/25/2018  . Supervision of high risk pregnancy, antepartum 07/21/2018  . Chronic hypertension during pregnancy, antepartum 07/21/2018  . Previous gastric bypass affecting pregnancy, antepartum 07/21/2018  . 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 AMA (advanced maternal age) primigravida 35+ 07/02/2018  . Eczema of both hands 08/16/2014  . Obesity 02/11/2012    Assessment / Plan: 38 y.o. G4P0030 at [redacted]w[redacted]d here for IOL for CHTN   Labor: FB placed using speculum and cooks balloon. Continue cytotec until FB is out  Fetal Wellbeing:  Cat I  Pain Control:  IV Fentanyl  Anticipated MOD:  SVD  Sharyon Cable, CNM 01/28/2019, 11:01 PM

## 2019-01-29 ENCOUNTER — Ambulatory Visit (HOSPITAL_COMMUNITY): Payer: BLUE CROSS/BLUE SHIELD

## 2019-01-29 ENCOUNTER — Encounter (HOSPITAL_COMMUNITY): Payer: Self-pay

## 2019-01-29 LAB — ABO/RH: ABO/RH(D): AB POS

## 2019-01-29 LAB — RPR: RPR Ser Ql: NONREACTIVE

## 2019-01-29 MED ORDER — LABETALOL HCL 5 MG/ML IV SOLN
20.0000 mg | INTRAVENOUS | Status: DC | PRN
  Filled 2019-01-29: qty 4

## 2019-01-29 MED ORDER — LABETALOL HCL 5 MG/ML IV SOLN: 40.0000 mg | INTRAVENOUS | Status: DC | PRN

## 2019-01-29 MED ORDER — OXYTOCIN 40 UNITS IN NORMAL SALINE INFUSION - SIMPLE MED
1.0000 m[IU]/min | INTRAVENOUS | Status: DC
  Administered 2019-01-29 – 2019-01-30 (×3): 2 m[IU]/min via INTRAVENOUS
  Filled 2019-01-29 (×2): qty 1000

## 2019-01-29 MED ORDER — NALBUPHINE HCL 10 MG/ML IJ SOLN
10.0000 mg | Freq: Once | INTRAMUSCULAR | Status: AC
  Administered 2019-01-29: 10 mg via INTRAVENOUS
  Filled 2019-01-29: qty 1

## 2019-01-29 MED ORDER — LABETALOL HCL 5 MG/ML IV SOLN: 80.0000 mg | INTRAVENOUS | Status: DC | PRN

## 2019-01-29 MED ORDER — HYDRALAZINE HCL 20 MG/ML IJ SOLN: 10.0000 mg | INTRAMUSCULAR | Status: DC | PRN

## 2019-01-29 NOTE — Progress Notes (Signed)
LABOR PROGRESS NOTE  Vanessa Braun is a 38 y.o. G4P0030 at [redacted]w[redacted]d  admitted for IOL for [facility]  Subjective: Patient doing well, reporting intermittent cramping   Objective: BP 140/74   Pulse 70   Temp 98.3 F (36.8 C) (Oral)   Resp 18   Ht 5\' 9"  (1.753 m)   Wt 110.7 kg   LMP 05/07/2018   BMI 36.03 kg/m  or  Vitals:   01/28/19 2303 01/29/19 0142 01/29/19 0310 01/29/19 0406  BP: (!) 149/85 (!) 156/83 (!) 155/87 140/74  Pulse: 61 82 89 70  Resp:      Temp:  98.3 F (36.8 C)  98.3 F (36.8 C)  TempSrc:  Oral  Oral  Weight:      Height:        FB continues to be in place, gentle traction and retaped  Dilation: 1 Effacement (%): Thick Station: -3 Presentation: Vertex Exam by:: CNM V. Aundria Rud FHT: baseline rate 135, moderate varibility, +accel, no decel Toco: no UC   Labs: Lab Results  Component Value Date   WBC 7.7 01/28/2019   HGB 10.6 (L) 01/28/2019   HCT 32.5 (L) 01/28/2019   MCV 90.5 01/28/2019   PLT 155 01/28/2019    Patient Active Problem List   Diagnosis Date Noted  . Hypertension in pregnancy, antepartum 01/28/2019  . Vitamin D deficiency 07/25/2018  . Supervision of high risk pregnancy, antepartum 07/21/2018  . Chronic hypertension during pregnancy, antepartum 07/21/2018  . Previous gastric bypass affecting pregnancy, antepartum 07/21/2018  . 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 AMA (advanced maternal age) primigravida 35+ 07/02/2018  . Eczema of both hands 08/16/2014  . Obesity 02/11/2012    Assessment / Plan: 38 y.o. G4P0030 at [redacted]w[redacted]d here for IOL for CHTN   Labor: FB continues to be in place, 5 doses of cytotec given, low dose pitocin to be started at 0630. Fetal Wellbeing:  Cat I Pain Control:  IV pain medication  Anticipated MOD:  SVD  Sharyon Cable, CNM 01/29/2019, 4:59 AM

## 2019-01-29 NOTE — Progress Notes (Signed)
LABOR PROGRESS NOTE  Vanessa Braun is a 38 y.o. G4P0030 at [redacted]w[redacted]d admitted for IOL for cHTN  Subjective: Comfortable No concerns  Objective: BP 128/63   Pulse 74   Temp 98.7 F (37.1 C) (Oral)   Resp 18   Ht 5\' 9"  (1.753 m)   Wt 110.7 kg   LMP 05/07/2018   BMI 36.04 kg/m  or  Vitals:   01/29/19 2000 01/29/19 2001 01/29/19 2032 01/29/19 2101  BP:  128/74 (!) 132/100 128/63  Pulse:  65 90 74  Resp:  18 18 18   Temp:      TempSrc:      Weight: 110.7 kg     Height: 5\' 9"  (1.753 m)       Dilation: 4.5 Effacement (%): 60 Station: -2 Presentation: Vertex Exam by:: Dr. Janina Mayo: baseline rate 140s, moderate varibility, + acel, no decel Toco: difficult to trace  Labs: Lab Results  Component Value Date   WBC 7.7 01/28/2019   HGB 10.6 (L) 01/28/2019   HCT 32.5 (L) 01/28/2019   MCV 90.5 01/28/2019   PLT 155 01/28/2019    Patient Active Problem List   Diagnosis Date Noted  . Hypertension in pregnancy, antepartum 01/28/2019  . Vitamin D deficiency 07/25/2018  . Supervision of high risk pregnancy, antepartum 07/21/2018  . Chronic hypertension during pregnancy, antepartum 07/21/2018  . Previous gastric bypass affecting pregnancy, antepartum 07/21/2018  . 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 AMA (advanced maternal age) primigravida 35+ 07/02/2018  . Eczema of both hands 08/16/2014  . Obesity 02/11/2012    Assessment / Plan: 38 y.o. G4P0030 at [redacted]w[redacted]d here for IOL for cHTN  Labor: s/p cytotec and FB. Continue pitocin 2x2. Consider AROM with next check.  Fetal Wellbeing:  Cat 1 Pain Control:  Comfortable now Anticipated MOD:  SVD  Luisfelipe Engelstad,MD OB Fellow  01/29/2019, 9:25 PM

## 2019-01-30 ENCOUNTER — Encounter (HOSPITAL_COMMUNITY)
Admission: AD | Disposition: A | Discharge status: Home / Self Care | Payer: Self-pay | Source: Home / Self Care | Attending: Family Medicine

## 2019-01-30 ENCOUNTER — Encounter: Payer: Self-pay | Admitting: *Deleted

## 2019-01-30 ENCOUNTER — Inpatient Hospital Stay (HOSPITAL_COMMUNITY): Payer: BLUE CROSS/BLUE SHIELD | Admitting: Anesthesiology

## 2019-01-30 DIAGNOSIS — O1002 Pre-existing essential hypertension complicating childbirth: Secondary | ICD-10-CM

## 2019-01-30 DIAGNOSIS — Z3A38 38 weeks gestation of pregnancy: Secondary | ICD-10-CM

## 2019-01-30 LAB — CBC
HCT: 34.9 % — ABNORMAL LOW (ref 36.0–46.0)
HCT: 33.1 % — ABNORMAL LOW (ref 36.0–46.0)
Hemoglobin: 11.2 g/dL — ABNORMAL LOW (ref 12.0–15.0)
Hemoglobin: 10.9 g/dL — ABNORMAL LOW (ref 12.0–15.0)
MCH: 28.4 pg (ref 26.0–34.0)
MCH: 29.4 pg (ref 26.0–34.0)
MCHC: 32.1 g/dL (ref 30.0–36.0)
MCHC: 32.9 g/dL (ref 30.0–36.0)
MCV: 88.4 fL (ref 80.0–100.0)
MCV: 89.2 fL (ref 80.0–100.0)
Platelets: 162 10*3/uL (ref 150–400)
Platelets: 151 10*3/uL (ref 150–400)
RBC: 3.95 MIL/uL (ref 3.87–5.11)
RBC: 3.71 MIL/uL — ABNORMAL LOW (ref 3.87–5.11)
RDW: 12.4 % (ref 11.5–15.5)
RDW: 12.6 % (ref 11.5–15.5)
WBC: 9.7 10*3/uL (ref 4.0–10.5)
WBC: 15.2 10*3/uL — ABNORMAL HIGH (ref 4.0–10.5)
nRBC: 0 % (ref 0.0–0.2)
nRBC: 0 % (ref 0.0–0.2)

## 2019-01-30 SURGERY — Surgical Case

## 2019-01-30 SURGERY — Surgical Case
Anesthesia: Epidural | Site: Abdomen | Wound class: Clean Contaminated

## 2019-01-30 MED ORDER — FENTANYL CITRATE (PF) 100 MCG/2ML IJ SOLN
100.0000 ug | Freq: Once | INTRAMUSCULAR | Status: AC
  Administered 2019-01-30: 100 ug via INTRAVENOUS
  Filled 2019-01-30: qty 2

## 2019-01-30 MED ORDER — EPHEDRINE 5 MG/ML INJ: 10.0000 mg | INTRAVENOUS | Status: DC | PRN

## 2019-01-30 MED ORDER — CLINDAMYCIN PHOSPHATE 900 MG/50ML IV SOLN
900.0000 mg | INTRAVENOUS | Status: AC
  Administered 2019-01-30: 17:00:00 900 mg via INTRAVENOUS

## 2019-01-30 MED ORDER — PHENYLEPHRINE 40 MCG/ML (10ML) SYRINGE FOR IV PUSH (FOR BLOOD PRESSURE SUPPORT): 80.0000 ug | PREFILLED_SYRINGE | INTRAVENOUS | Status: DC | PRN

## 2019-01-30 MED ORDER — LACTATED RINGERS IV SOLN
INTRAVENOUS | Status: DC
  Administered 2019-01-31: 06:00:00 via INTRAVENOUS

## 2019-01-30 MED ORDER — MEPERIDINE HCL 25 MG/ML IJ SOLN: 6.2500 mg | INTRAMUSCULAR | Status: DC | PRN

## 2019-01-30 MED ORDER — SIMETHICONE 80 MG PO CHEW: 80.0000 mg | CHEWABLE_TABLET | ORAL | Status: DC | PRN

## 2019-01-30 MED ORDER — WITCH HAZEL-GLYCERIN EX PADS: 1.0000 "application " | MEDICATED_PAD | CUTANEOUS | Status: DC | PRN

## 2019-01-30 MED ORDER — MENTHOL 3 MG MT LOZG: 1.0000 | LOZENGE | OROMUCOSAL | Status: DC | PRN

## 2019-01-30 MED ORDER — HYDROMORPHONE HCL 1 MG/ML IJ SOLN
INTRAMUSCULAR | Status: AC
  Filled 2019-01-30: qty 0.5

## 2019-01-30 MED ORDER — SODIUM CHLORIDE (PF) 0.9 % IJ SOLN
INTRAMUSCULAR | Status: DC | PRN
  Administered 2019-01-30: 12 mL/h via EPIDURAL

## 2019-01-30 MED ORDER — BUPIVACAINE HCL 0.25 % IJ SOLN
INTRAMUSCULAR | Status: DC | PRN
  Administered 2019-01-30: 30 mL

## 2019-01-30 MED ORDER — LACTATED RINGERS AMNIOINFUSION
INTRAVENOUS | Status: DC
  Filled 2019-01-30: qty 1000

## 2019-01-30 MED ORDER — HYDROMORPHONE HCL 1 MG/ML IJ SOLN
0.2500 mg | INTRAMUSCULAR | Status: DC | PRN
  Administered 2019-01-30 (×3): 0.5 mg via INTRAVENOUS

## 2019-01-30 MED ORDER — CLINDAMYCIN PHOSPHATE 900 MG/50ML IV SOLN
INTRAVENOUS | Status: AC
  Filled 2019-01-30: qty 50

## 2019-01-30 MED ORDER — OXYTOCIN 40 UNITS IN NORMAL SALINE INFUSION - SIMPLE MED: 2.5000 [IU]/h | INTRAVENOUS | Status: AC

## 2019-01-30 MED ORDER — LIDOCAINE-EPINEPHRINE (PF) 2 %-1:200000 IJ SOLN
INTRAMUSCULAR | Status: AC
  Filled 2019-01-30: qty 20

## 2019-01-30 MED ORDER — KETOROLAC TROMETHAMINE 30 MG/ML IJ SOLN: 30.0000 mg | Freq: Once | INTRAMUSCULAR | Status: DC | PRN

## 2019-01-30 MED ORDER — PROMETHAZINE HCL 25 MG/ML IJ SOLN: 6.2500 mg | INTRAMUSCULAR | Status: DC | PRN

## 2019-01-30 MED ORDER — OXYTOCIN 40 UNITS IN NORMAL SALINE INFUSION - SIMPLE MED
INTRAVENOUS | Status: AC
  Filled 2019-01-30: qty 1000

## 2019-01-30 MED ORDER — SODIUM CHLORIDE 0.9 % IR SOLN
Status: DC | PRN
  Administered 2019-01-30: 1000 mL

## 2019-01-30 MED ORDER — DIPHENHYDRAMINE HCL 25 MG PO CAPS: 25.0000 mg | ORAL_CAPSULE | ORAL | Status: DC | PRN

## 2019-01-30 MED ORDER — NALBUPHINE HCL 10 MG/ML IJ SOLN: 5.0000 mg | Freq: Once | INTRAMUSCULAR | Status: DC | PRN

## 2019-01-30 MED ORDER — DIPHENHYDRAMINE HCL 50 MG/ML IJ SOLN: 12.5000 mg | INTRAMUSCULAR | Status: DC | PRN

## 2019-01-30 MED ORDER — KETOROLAC TROMETHAMINE 30 MG/ML IJ SOLN
INTRAMUSCULAR | Status: AC
  Filled 2019-01-30: qty 1

## 2019-01-30 MED ORDER — SODIUM CHLORIDE 0.9 % IV SOLN
INTRAVENOUS | Status: AC
  Filled 2019-01-30: qty 500

## 2019-01-30 MED ORDER — MORPHINE SULFATE (PF) 0.5 MG/ML IJ SOLN
INTRAMUSCULAR | Status: DC | PRN
  Administered 2019-01-30: 3 mg via EPIDURAL

## 2019-01-30 MED ORDER — OXYCODONE HCL 5 MG/5ML PO SOLN: 5.0000 mg | Freq: Once | ORAL | Status: DC | PRN

## 2019-01-30 MED ORDER — SODIUM CHLORIDE 0.9 % IV SOLN
INTRAVENOUS | Status: DC | PRN
  Administered 2019-01-30: 18:00:00 40 [IU] via INTRAVENOUS

## 2019-01-30 MED ORDER — DIPHENHYDRAMINE HCL 25 MG PO CAPS: 25.0000 mg | ORAL_CAPSULE | Freq: Four times a day (QID) | ORAL | Status: DC | PRN

## 2019-01-30 MED ORDER — DIBUCAINE 1 % RE OINT: 1.0000 "application " | TOPICAL_OINTMENT | RECTAL | Status: DC | PRN

## 2019-01-30 MED ORDER — SENNOSIDES-DOCUSATE SODIUM 8.6-50 MG PO TABS
2.0000 | ORAL_TABLET | ORAL | Status: DC
  Administered 2019-01-31: 2 via ORAL
  Filled 2019-01-30 (×2): qty 2

## 2019-01-30 MED ORDER — ONDANSETRON HCL 4 MG/2ML IJ SOLN
INTRAMUSCULAR | Status: AC
  Filled 2019-01-30: qty 2

## 2019-01-30 MED ORDER — FENTANYL-BUPIVACAINE-NACL 0.5-0.125-0.9 MG/250ML-% EP SOLN
12.0000 mL/h | EPIDURAL | Status: DC | PRN
  Filled 2019-01-30 (×2): qty 250

## 2019-01-30 MED ORDER — NALOXONE HCL 4 MG/10ML IJ SOLN
1.0000 ug/kg/h | INTRAVENOUS | Status: DC | PRN
  Filled 2019-01-30: qty 5

## 2019-01-30 MED ORDER — MEASLES, MUMPS & RUBELLA VAC IJ SOLR: 0.5000 mL | Freq: Once | INTRAMUSCULAR | Status: DC

## 2019-01-30 MED ORDER — LIDOCAINE HCL (PF) 1 % IJ SOLN
INTRAMUSCULAR | Status: DC | PRN
  Administered 2019-01-30 (×2): 4 mL via EPIDURAL

## 2019-01-30 MED ORDER — ZOLPIDEM TARTRATE 5 MG PO TABS: 5.0000 mg | ORAL_TABLET | Freq: Every evening | ORAL | Status: DC | PRN

## 2019-01-30 MED ORDER — SODIUM CHLORIDE 0.9 % IV SOLN
500.0000 mg | Freq: Once | INTRAVENOUS | Status: AC
  Administered 2019-01-30: 17:00:00 500 mg via INTRAVENOUS
  Filled 2019-01-30: qty 500

## 2019-01-30 MED ORDER — GENTAMICIN SULFATE 40 MG/ML IJ SOLN
5.0000 mg/kg | INTRAVENOUS | Status: AC
  Administered 2019-01-30: 17:00:00 420 mg via INTRAVENOUS
  Filled 2019-01-30: qty 10.5

## 2019-01-30 MED ORDER — TETANUS-DIPHTH-ACELL PERTUSSIS 5-2.5-18.5 LF-MCG/0.5 IM SUSP: 0.5000 mL | Freq: Once | INTRAMUSCULAR | Status: DC

## 2019-01-30 MED ORDER — OXYCODONE HCL 5 MG PO TABS
5.0000 mg | ORAL_TABLET | ORAL | Status: DC | PRN
  Administered 2019-01-31 – 2019-02-01 (×3): 5 mg via ORAL
  Administered 2019-02-01 – 2019-02-02 (×5): 10 mg via ORAL
  Filled 2019-01-30 (×2): qty 1
  Filled 2019-01-30 (×3): qty 2
  Filled 2019-01-30: qty 1
  Filled 2019-01-30 (×2): qty 2

## 2019-01-30 MED ORDER — NALOXONE HCL 0.4 MG/ML IJ SOLN: 0.4000 mg | INTRAMUSCULAR | Status: DC | PRN

## 2019-01-30 MED ORDER — SODIUM CHLORIDE 0.9 % IV SOLN
INTRAVENOUS | Status: DC | PRN
  Administered 2019-01-30: 17:00:00 via INTRAVENOUS

## 2019-01-30 MED ORDER — COCONUT OIL OIL: 1.0000 "application " | TOPICAL_OIL | Status: DC | PRN

## 2019-01-30 MED ORDER — SCOPOLAMINE 1 MG/3DAYS TD PT72: 1.0000 | MEDICATED_PATCH | Freq: Once | TRANSDERMAL | Status: DC

## 2019-01-30 MED ORDER — SIMETHICONE 80 MG PO CHEW
80.0000 mg | CHEWABLE_TABLET | Freq: Three times a day (TID) | ORAL | Status: DC
  Administered 2019-01-31 – 2019-02-02 (×8): 80 mg via ORAL
  Filled 2019-01-30 (×8): qty 1

## 2019-01-30 MED ORDER — SIMETHICONE 80 MG PO CHEW
80.0000 mg | CHEWABLE_TABLET | ORAL | Status: DC
  Administered 2019-01-31 – 2019-02-01 (×3): 80 mg via ORAL
  Filled 2019-01-30 (×3): qty 1

## 2019-01-30 MED ORDER — ACETAMINOPHEN 500 MG PO TABS
1000.0000 mg | ORAL_TABLET | Freq: Four times a day (QID) | ORAL | Status: DC
  Administered 2019-01-31 – 2019-02-02 (×11): 1000 mg via ORAL
  Filled 2019-01-30 (×12): qty 2

## 2019-01-30 MED ORDER — SODIUM CHLORIDE 0.9% FLUSH: 3.0000 mL | INTRAVENOUS | Status: DC | PRN

## 2019-01-30 MED ORDER — LACTATED RINGERS IV SOLN: 120.0000 mL/h | INTRAVENOUS | Status: DC

## 2019-01-30 MED ORDER — SODIUM BICARBONATE 8.4 % IV SOLN
INTRAVENOUS | Status: DC | PRN
  Administered 2019-01-30 (×3): 5 mL via EPIDURAL

## 2019-01-30 MED ORDER — ENOXAPARIN SODIUM 60 MG/0.6ML ~~LOC~~ SOLN
60.0000 mg | SUBCUTANEOUS | Status: DC
  Administered 2019-01-31 – 2019-02-02 (×3): 60 mg via SUBCUTANEOUS
  Filled 2019-01-30 (×3): qty 0.6

## 2019-01-30 MED ORDER — NALBUPHINE HCL 10 MG/ML IJ SOLN: 5.0000 mg | INTRAMUSCULAR | Status: DC | PRN

## 2019-01-30 MED ORDER — OXYTOCIN 10 UNIT/ML IJ SOLN
INTRAMUSCULAR | Status: AC
  Filled 2019-01-30: qty 4

## 2019-01-30 MED ORDER — BUPIVACAINE HCL (PF) 0.25 % IJ SOLN
INTRAMUSCULAR | Status: AC
  Filled 2019-01-30: qty 30

## 2019-01-30 MED ORDER — STERILE WATER FOR IRRIGATION IR SOLN
Status: DC | PRN
  Administered 2019-01-30: 1000 mL

## 2019-01-30 MED ORDER — PRENATAL MULTIVITAMIN CH
1.0000 | ORAL_TABLET | Freq: Every day | ORAL | Status: DC
  Administered 2019-01-31 – 2019-02-02 (×3): 1 via ORAL
  Filled 2019-01-30 (×3): qty 1

## 2019-01-30 MED ORDER — OXYCODONE HCL 5 MG PO TABS: 5.0000 mg | ORAL_TABLET | Freq: Once | ORAL | Status: DC | PRN

## 2019-01-30 MED ORDER — ONDANSETRON HCL 4 MG/2ML IJ SOLN: 4.0000 mg | Freq: Three times a day (TID) | INTRAMUSCULAR | Status: DC | PRN

## 2019-01-30 MED ORDER — MORPHINE SULFATE (PF) 0.5 MG/ML IJ SOLN
INTRAMUSCULAR | Status: AC
  Filled 2019-01-30: qty 10

## 2019-01-30 MED ORDER — SODIUM BICARBONATE 8.4 % IV SOLN
INTRAVENOUS | Status: AC
  Filled 2019-01-30: qty 50

## 2019-01-30 MED ORDER — LACTATED RINGERS IV SOLN: 500.0000 mL | Freq: Once | INTRAVENOUS | Status: DC

## 2019-01-30 MED ORDER — FENTANYL-BUPIVACAINE-NACL 0.5-0.125-0.9 MG/250ML-% EP SOLN: 12.0000 mL/h | EPIDURAL | Status: DC | PRN

## 2019-01-30 SURGICAL SUPPLY — 33 items
BENZOIN TINCTURE PRP APPL 2/3 (GAUZE/BANDAGES/DRESSINGS) ×2 IMPLANT
CANISTER SUCT 3000ML PPV (MISCELLANEOUS) ×2 IMPLANT
CHLORAPREP W/TINT 26ML (MISCELLANEOUS) ×2 IMPLANT
CLAMP CORD UMBIL (MISCELLANEOUS) ×2 IMPLANT
CLOTH BEACON ORANGE TIMEOUT ST (SAFETY) ×2 IMPLANT
DRAPE SHEET LG 3/4 BI-LAMINATE (DRAPES) ×2 IMPLANT
DRESSING PREVENA PLUS CUSTOM (GAUZE/BANDAGES/DRESSINGS) IMPLANT
DRSG OPSITE POSTOP 4X10 (GAUZE/BANDAGES/DRESSINGS) ×2 IMPLANT
DRSG PREVENA PLUS CUSTOM (GAUZE/BANDAGES/DRESSINGS) ×2
ELECT REM PT RETURN 9FT ADLT (ELECTROSURGICAL) ×2
ELECTRODE REM PT RTRN 9FT ADLT (ELECTROSURGICAL) ×1 IMPLANT
GLOVE BIOGEL PI IND STRL 7.0 (GLOVE) ×3 IMPLANT
GLOVE BIOGEL PI INDICATOR 7.0 (GLOVE) ×3
GLOVE ECLIPSE 6.5 STRL STRAW (GLOVE) ×2 IMPLANT
GOWN STRL REUS W/ TWL LRG LVL3 (GOWN DISPOSABLE) ×2 IMPLANT
GOWN STRL REUS W/TWL LRG LVL3 (GOWN DISPOSABLE) ×2
NEEDLE HYPO 22GX1.5 SAFETY (NEEDLE) ×2 IMPLANT
NS IRRIG 1000ML POUR BTL (IV SOLUTION) ×2 IMPLANT
PAD OB MATERNITY 4.3X12.25 (PERSONAL CARE ITEMS) ×2 IMPLANT
PAD PREP 24X48 CUFFED NSTRL (MISCELLANEOUS) ×2 IMPLANT
RETAINER VISCERAL (MISCELLANEOUS) ×1 IMPLANT
RETRACTOR WND ALEXIS 25 LRG (MISCELLANEOUS) IMPLANT
RTRCTR WOUND ALEXIS 25CM LRG (MISCELLANEOUS)
STRIP CLOSURE SKIN 1/2X4 (GAUZE/BANDAGES/DRESSINGS) ×2 IMPLANT
SUT PLAIN 2 0 XLH (SUTURE) ×3 IMPLANT
SUT VIC AB 0 CT1 36 (SUTURE) ×4 IMPLANT
SUT VIC AB 2-0 CT1 27 (SUTURE) ×1
SUT VIC AB 2-0 CT1 TAPERPNT 27 (SUTURE) ×1 IMPLANT
SUT VIC AB 4-0 KS 27 (SUTURE) ×2 IMPLANT
SYR CONTROL 10ML LL (SYRINGE) ×2 IMPLANT
TOWEL OR 17X24 6PK STRL BLUE (TOWEL DISPOSABLE) ×6 IMPLANT
VACUUM CUP M-STYLE MYSTIC II (SUCTIONS) ×1 IMPLANT
WATER STERILE IRR 1000ML POUR (IV SOLUTION) ×2 IMPLANT

## 2019-01-30 NOTE — Consult Note (Signed)
Neonatology Note:   Attendance at C-section:    I was asked by Dr. Wallace to attend this primary C/S at 38 2/7 weeks due to NRFHR. The mother is a G4P0A3 AB pos, GBS neg with IOL for chronic HTN, AMA,  and IUGR. ROM 16 hours prior to delivery, fluid clear. Vacuum-assisted delivery. Infant quiet, without spontaneous cry. Delayed cord clamping was suspended after about 20 seconds. Needed bulb suctioning and stimulation, then she began to cry. The baby was increasingly vigorous over the first 2-3 minutes. Ap 6/9. Lungs clear to ausc in DR. Preliminary weight 2300 grams. Infant is able to remain with her mother for skin to skin time under nursing supervision. Transferred to the care of Pediatrician.   Alicea Wente C. Halaina Vanduzer, MD 

## 2019-01-30 NOTE — Lactation Note (Addendum)
This note was copied from a baby's chart. Lactation Consultation Note  Patient Name: Vanessa Armando ReichertBeverly Curnow ZOXWR'UToday's Date: 01/30/2019 Reason for consult: Initial assessment;1st time breastfeeding;Early term 37-38.6wks;Infant < 6lbs P1, 4 hour female infant, IUGR and less than 6 lbs. [facility] [place] and attended a breastfeeding class in her pregnancy.  Mom feeding choice at admission was breast and bottle feeding. Per mom, she doesn't want to supplement with formula at this time only breastfeed infant although she is aware of infant's small size. Per mom, she feels breastfeeding is going well and infant is latching without difficulty. Per mom infant lat[facility] for [facility] minutes [facility], LC assessed latch infant breastfeed for additional 5 minutes. Mom latched infant on left breast using cross cradle hold, infant was nose to breast and few swallows observed. LC discussed infant needs to be supplement due small size less than 6 lbs (IUGR) mom hand expressed 8 ml of colostrum that was given by curve tip syringe. Mom understands that she must supplement infant with EBM / or formula after each feeding. Mom has sheet of supplementing with breastfeeding  For each feeding 0-12 hours of life ( 5-7 ml),  24-48 hours of life ( 7-12 ml), and 48-72 hours of life ( 18-25 ml). Infant did not exhibit any difficulties with receiving (8ml)  supplemented EBM and  suckled well on gloved finger . Mom explained how to use hand pump and DEBP, LC discussed assembly, re-assemble, cleaning and storage. Mom was using DEBP when LC left room , was still pumping and had already pumped 7 ml for next feeding to supplement infant after breastfeeding. Mom knows to breastfeed infant according hunger cues, 8 or more times within 24 hours. LC discussed I & O. Mom knows to call Nurse or LC if she has any questions, concerns or need assistance with latching infant to breast, Mom made aware of O/P services,  breastfeeding support groups, community resources, and our phone # for post-discharge questions.  Mom's plan: 1. BF according hunger cues, 8 or more times within 24 hours. 2. Mom knows the importance of supplementing due to IUGR and weighing less than 6 lbs after each feeding with EBM/ or formula based on infant age/ hours of life. 3. Mom knows to use DEBP every 3 hours for 15 minutes on initial setting. 4. Parents will do as much STS as possible. 5. Mom knows to call Nurse or LC if she has any questions, concerns or need assistance with latching infant to breast.   Maternal Data Formula Feeding for Exclusion: Yes Reason for exclusion: Mother's choice to formula and breast feed on admission Has patient been taught Hand Expression?: Yes(8 ml of colostrum given by curve tip syringe) Does the patient have breastfeeding experience prior to this delivery?: No  Feeding Feeding Type: Breast Fed  LATCH Score Latch: Grasps breast easily, tongue down, lips flanged, rhythmical sucking.  Audible Swallowing: A few with stimulation  Type of Nipple: Everted at rest and after stimulation  Comfort (Breast/Nipple): Soft / non-tender  Hold (Positioning): Assistance needed to correctly position infant at breast and maintain latch.  LATCH Score: 8  Interventions Interventions: Breast feeding basics reviewed;Assisted with latch;Skin to skin;Breast massage;Hand express;Support pillows;Adjust position;Breast compression;Position options;Expressed milk;DEBP;Hand pump  Lactation Tools Discussed/Used WIC Program: Yes Pump Review: Setup, frequency, and cleaning;Milk Storage Initiated by:: Danelle Earthlyobin Anson Peddie, IBCLC Date initiated:: 01/31/19   Consult Status Consult Status: Follow-up Date: 01/31/19 Follow-up type: In-patient    Zella Ballobin  Nillie Bartolotta 01/30/2019, 9:36 PM

## 2019-01-30 NOTE — Progress Notes (Signed)
Patient ID: Vanessa Braun, female   DOB: 1981-08-24, 38 y.o.   MRN: [id number] Labor Progress Note Vanessa Braun is a 38 y.o. G4P0030 at [redacted]w[redacted]d presented for IOL for cHTN with sub-optimal growth and borderline IUGR  S: Patient restarted   O:  BP 130/68   Pulse 72   Temp 98.5 F (36.9 C) (Oral)   Resp 20   Ht 5\' 9"  (1.753 m)   Wt 110.7 kg   LMP 05/07/2018   SpO2 100%   BMI 36.04 kg/m  EFM: 140/moderate/No accels, + decels, variable in nature with nadir at 80 and quick resolution.   Performed position changes, removed IUPC, stopped Pitocin, placed O2, and gave terbutaline.   CVE: Dilation: 5.5 Effacement (%): 70 Cervical Position: Anterior Station: -1 Presentation: Vertex Exam by:: Dr Alvester Morin   A&P: 38 y.o. G4P0030 at [redacted]w[redacted]d presented for IOL for cHTN with sub-optimal growth and borderline IUGR  #Labor: Protracted 1st stage. Concern for fetal intolerance. Patient received pitocin break earlier in the morning on 4/7 and again after prolonged decel at 1:30 PM. Currently on low pitocin (4u). IUPC replaced to help monitor contractions.   #Pain: Epidural  #FWB: Cat 2- IUPC replaced and Amnioinfusion started bolus with 125 ml/hr after that. Close monitoring and low threshold to proceed for urgent delivery. Currently low risk of fetal acidemia but high risk of progression.    #GBS negative  Federico Flake, MD 4:18 PM

## 2019-01-30 NOTE — Progress Notes (Signed)
Subjective: Vanessa Braun is a 38 y.o. G4P0030 at [redacted]w[redacted]d by LMP admitted for induction of labor due to chronic Hypertension. FB still in place from 2224 on 01/28/2019.  Objective: BP 110/60   Pulse (!) 54   Temp 98.5 F (36.9 C) (Oral)   Resp 18   Ht 5\' 9"  (1.753 m)   Wt 110.7 kg   LMP 05/07/2018   SpO2 100%   BMI 36.04 kg/m  No intake/output data recorded. No intake/output data recorded.  FHT:  FHR: 145 bpm, variability: moderate,  accelerations:  Present,  decelerations:  Absent UC:   Unable to trace d/t maternal habitus SVE:   Dilation: 1.0 Effacement (%): thick Station: high Exam by: Erven Colla RN  FB in place -- RN maintaining constant tension on FB every 2 hrs Pitocin 6 mU/min  Labs: Lab Results  Component Value Date   WBC 9.7 01/30/2019   HGB 11.2 (L) 01/30/2019   HCT 34.9 (L) 01/30/2019   MCV 88.4 01/30/2019   PLT 162 01/30/2019    Assessment / Plan: Induction of labor due to cHTN,  progressing well on pitocin  Labor: Not in labor with FB in place 13 hrs and pitocin at 6 mU/min Preeclampsia:  n/a Fetal Wellbeing:  Category I Pain Control:  Labor support without medications I/D:  n/a Anticipated MOD:  NSVD  Vanessa Mora, MSN, CNM 01/29/2019, 11:30 AM Late Entry on 01/30/19 @ 0730

## 2019-01-30 NOTE — Progress Notes (Signed)
LABOR PROGRESS NOTE  Vanessa Braun is a 38 y.o. G4P0030 at [redacted]w[redacted]d  admitted for IOL for cHTN  Subjective: Not feeling much pain with contractions  Objective: BP (!) 141/87   Pulse 71   Temp 98.7 F (37.1 C) (Oral)   Resp 18   Ht 5\' 9"  (1.753 m)   Wt 110.7 kg   LMP 05/07/2018   BMI 36.04 kg/m  or  Vitals:   01/29/19 2301 01/29/19 2331 01/30/19 0001 01/30/19 0101  BP: 124/71 121/60 128/62 (!) 141/87  Pulse: 62 (!) 54 (!) 55 71  Resp: 18 18 18 18   Temp:      TempSrc:      Weight:      Height:        Dilation: 4.5 Effacement (%): 60 Station: -2, -1 Presentation: Vertex Exam by:: Dr. Janina Mayo: baseline rate 150, moderate varibility, + acel, variable decel Toco: difficult to trace  Labs: Lab Results  Component Value Date   WBC 7.7 01/28/2019   HGB 10.6 (L) 01/28/2019   HCT 32.5 (L) 01/28/2019   MCV 90.5 01/28/2019   PLT 155 01/28/2019    Patient Active Problem List   Diagnosis Date Noted  . Hypertension in pregnancy, antepartum 01/28/2019  . Vitamin D deficiency 07/25/2018  . Supervision of high risk pregnancy, antepartum 07/21/2018  . Chronic hypertension during pregnancy, antepartum 07/21/2018  . Previous gastric bypass affecting pregnancy, antepartum 07/21/2018  . 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 AMA (advanced maternal age) primigravida 35+ 07/02/2018  . Eczema of both hands 08/16/2014  . Obesity 02/11/2012    Assessment / Plan: 38 y.o. G4P0030 at [redacted]w[redacted]d here for IOL for cHTN  Labor: Minimal change since last check, but not in active labor yet. Induction ongoing since morning of 4/5. Had several doses of cytotec and been on pit around 19 hours. Will AROM and place IUPC for monitoring of MVUs.  Fetal Wellbeing:  Cat 2 (reassuring) Pain Control:  Comfortable Anticipated MOD:  SVD  Vanessa Mochizuki,MD OB Fellow  01/30/2019, 1:19 AM

## 2019-01-30 NOTE — Progress Notes (Signed)
Vanessa Braun is a 38 y.o. G4P0030 at [redacted]w[redacted]d by 7 week ultrasound admitted for induction of labor due to [facility].  Subjective: Pt comfortable with epidural.  S/O in room for support.  Objective: BP 122/67   Pulse 72   Temp 98.2 F (36.8 C) (Oral)   Resp 18   Ht 5\' 9"  (1.753 m)   Wt 110.7 kg   LMP 05/07/2018   SpO2 100%   BMI 36.04 kg/m  No intake/output data recorded. Total I/O In: -  Out: 175 [Urine:175]  FHT:  FHR: 135 bpm, variability: moderate,  accelerations:  Present (10 x 10),  decelerations:  Present occasional variables UC:   irregular, every 3-6 minutes SVE:   Dilation: 4 Effacement (%): 60 Station: -1 Exam by:: jaton burgess rnc  Labs: Lab Results  Component Value Date   WBC 9.7 01/30/2019   HGB 11.2 (L) 01/30/2019   HCT 34.9 (L) 01/30/2019   MCV 88.4 01/30/2019   PLT 162 01/30/2019    Assessment / Plan: Induction of labor due to [facility] Pitocin off x 4 hours this morning related to FHR tracing and for Pitocin break  Labor: Restart Pitocin at 2 milliunits, increase by 2 per protocol Preeclampsia:  labs stable Fetal Wellbeing:  Category II Pain Control:  Epidural I/D:  GBS negative Anticipated MOD:  NSVD  Sharen Counter 01/30/2019, 9:21 AM

## 2019-01-30 NOTE — Progress Notes (Signed)
Subjective: Vanessa Braun is a 38 y.o. G4P0030 at [redacted]w[redacted]d by LMP admitted for induction of labor due to Chronic Hypertension. FB out. Not feeling UC's.  Objective: BP 110/60   Pulse (!) 54   Temp 98.2 F (36.8 C) (Oral)   Resp 18   Ht 5\' 9"  (1.753 m)   Wt 110.7 kg   LMP 05/07/2018   SpO2 100%   BMI 36.04 kg/m    FHT:  FHR: 160 bpm, variability: moderate,  accelerations:  Present,  decelerations:  Absent UC:   regular, every 2 minutes SVE:   Dilation: 4.5 Effacement (%): Thick Station: -1 Exam by: Erven Colla RN   Labs: Lab Results  Component Value Date   WBC 9.7 01/30/2019   HGB 11.2 (L) 01/30/2019   HCT 34.9 (L) 01/30/2019   MCV 88.4 01/30/2019   PLT 162 01/30/2019    Assessment / Plan: Induction of labor due to cHTN,    Labor: Progressing slowly on Pitocin, will continue to increase then AROM Preeclampsia:  n/a Fetal Wellbeing:  Category I Pain Control:  Labor support without medications I/D:  n/a Anticipated MOD:  NSVD  Raelyn Mora, MSN, CNM 01/29/2019 3:00 PM Late Entry on 01/30/2019, 7:21 AM

## 2019-01-30 NOTE — Anesthesia Procedure Notes (Signed)
Epidural Patient location during procedure: OB  Staffing Anesthesiologist: Brinna Divelbiss, MD Performed: anesthesiologist   Preanesthetic Checklist Completed: patient identified, pre-op evaluation, timeout performed, IV checked, risks and benefits discussed and monitors and equipment checked  Epidural Patient position: sitting Prep: site prepped and draped and DuraPrep Patient monitoring: heart rate, continuous pulse ox and blood pressure Approach: midline Location: L3-L4 Injection technique: LOR air and LOR saline  Needle:  Needle type: Tuohy  Needle gauge: 17 G Needle length: 9 cm Needle insertion depth: 7 cm Catheter type: closed end flexible Catheter size: 19 Gauge Catheter at skin depth: 12 cm Test dose: negative  Assessment Sensory level: T8 Events: blood not aspirated, injection not painful, no injection resistance, negative IV test and no paresthesia  Additional Notes Reason for block:procedure for pain     

## 2019-01-30 NOTE — Progress Notes (Signed)
Koralynn Suer Beth is 38 y.o.  G4P0030 at [redacted]w[redacted]d presenting for IOL due to cHTN and poor fetal growth borderline IUGR with fetal intolerance of labor. Patient started induction on 4/5 and has progressed slowly. During her labor course she required two pitocin breaks due to fetal intolerance and a dose of terbutaline )last at ~1330. Patient was restarted on pitocin and infant developed deep variables to the 60-70s with slow return to baseline. Attempts at resolving variable decelerations were unsuccessful. We discussed proceeding to C-section.   The risks of cesarean section were discussed with the patient including but were not limited to: bleeding which may require transfusion or reoperation; infection which may require antibiotics; injury to bowel, bladder, ureters or other surrounding organs; injury to the fetus; need for additional procedures including hysterectomy in the event of a life-threatening hemorrhage; placental abnormalities wth subsequent pregnancies, incisional problems, thromboembolic phenomenon and other postoperative/anesthesia complications.  She will remain NPO for procedure. Anesthesia and OR aware.  Preoperative prophylactic antibiotics and SCDs ordered on call to the OR.  To OR when ready.   Federico Flake, MD, MPH, ABFM Attending Physician Center for [facility]

## 2019-01-30 NOTE — Progress Notes (Addendum)
Vanessa Braun is a 38 y.o. G4P0030 at [redacted]w[redacted]d by ultrasound admitted for induction of labor due to [facility] and suboptimal fetal growth per MFM.  Subjective: Pt comfortable with epidural  Objective: BP (!) 109/58   Pulse (!) 52   Temp 98.3 F (36.8 C) (Oral)   Resp 18   Ht 5\' 9"  (1.753 m)   Wt 110.7 kg   LMP 05/07/2018   SpO2 100%   BMI 36.04 kg/m  No intake/output data recorded. Total I/O In: -  Out: 175 [Urine:175]  FHT:  FHR: 135 bpm, variability: moderate,  accelerations:  Present,  decelerations:  Present isolated lates x 2 UC:   regular, every 3 minutes SVE:   Deferred  Labs: Lab Results  Component Value Date   WBC 9.7 01/30/2019   HGB 11.2 (L) 01/30/2019   HCT 34.9 (L) 01/30/2019   MCV 88.4 01/30/2019   PLT 162 01/30/2019    Assessment / Plan: Induction of labor due to [facility],  progressing well on pitocin  Labor: Progressing normally and plan to continue increasing Pitocin until adequate MVUs.   Preeclampsia:  labs stable Fetal Wellbeing:  Category I Pain Control:  Epidural I/D:  GBS negative Anticipated MOD:  NSVD  Sharen Counter 01/30/2019, 11:40 AM

## 2019-01-30 NOTE — Progress Notes (Signed)
Vanessa Braun is a 38 y.o. G4P0030 at 94w2dby ultrasound [facility]  Subjective: Pt comfortable with epidural.  Objective: BP 117/76   Pulse (!) 54   Temp 98.4 F (36.9 C) (Oral)   Resp 20   Ht _0  (1.753 m)   Wt 110.7 kg   LMP 05/07/2018   SpO2 100%   BMI 36.04 kg/m  No intake/output data recorded. Total I/O In: -  Out: 875 [Urine:875]  FHT:  FHR: 135 bpm, variability: moderate,  accelerations:  Present,  decelerations:  Present FHR tracing started having repetitive variables, which became progressively deeper and longer and with slower drop to nadir met criteria for lates.  Intrauterine resuscitation with IV fluids, position change and O2 by nonrebreather mask. Pitocin off.  FHR tracing improved. UC:   regular, every 2 minutes SVE:   Dilation: 4 Effacement (%): 70 Station: -1 Exam by:: [email or web address] rnc  Labs: Lab Results  Component Value Date   WBC 9.7 01/30/2019   HGB 11.2 (L) 01/30/2019   HCT 34.9 (L) 01/30/2019   MCV 88.4 01/30/2019   PLT 162 01/30/2019    Assessment / Plan: [facility],  progressing well on pitocin Pitocin off for FHR tracing  Labor: Progressing normally Preeclampsia:  labs stable Fetal Wellbeing:  Category II Pain Control:  Epidural I/D:  GBS neg Anticipated MOD:  NSVD  LFatima Blank4/04/2019, 1:09 PM

## 2019-01-30 NOTE — Op Note (Addendum)
Cesarean Section Operative Report  PATIENT: Vanessa Braun  PROCEDURE DATE: 01/30/2019  PREOPERATIVE DIAGNOSES: Intrauterine pregnancy at 4821w2d weeks gestation; non-reassuring fetal status  POSTOPERATIVE DIAGNOSES: The same  PROCEDURE: Primary Low Transverse Cesarean Section  SURGEON:   Surgeon(s) and Role:    Federico Flake* Newton, Kimberly Niles, MD - Primary - Attending   Marcy Sirenatherine Wallace, DO- OB Fellow   INDICATIONS: Vanessa Braun is a 38 y.o. G4P0030 at 4521w2d here for cesarean section secondary to the indications listed under preoperative diagnoses; please see preoperative note for further details.  The risks of cesarean section were discussed with the patient including but were not limited to: bleeding which may require transfusion or reoperation; infection which may require antibiotics; injury to bowel, bladder, ureters or other surrounding organs; injury to the fetus; need for additional procedures including hysterectomy in the event of a life-threatening hemorrhage; placental abnormalities wth subsequent pregnancies, incisional problems, thromboembolic phenomenon and other postoperative/anesthesia complications.   The patient concurred with the proposed plan, giving informed written consent for the procedure.    FINDINGS:  Viable female infant in cephalic direct OP presentation.  Nuchal cord x1 with body cord present. Apgars 6 and 9.  Clear amniotic fluid.  Intact placenta, three vessel cord.  Normal uterus, fallopian tubes and ovaries bilaterally. Adhesion present at top of uterus to small bowel.   ANESTHESIA: Epidural INTRAVENOUS FLUIDS: 9254 mL  ESTIMATED BLOOD LOSS: 354 mL URINE OUTPUT:  1200 ml SPECIMENS: Placenta sent to pathology COMPLICATIONS: None immediate  PROCEDURE IN DETAIL:  The patient preoperatively received intravenous antibiotics and had sequential compression devices applied to her lower extremities.  She was then taken to the operating room where the epidural  anesthesia was dosed up to surgical level and was found to be adequate. She was then placed in a dorsal supine position with a leftward tilt, and prepped and draped in a sterile manner.  A foley catheter was placed into her bladder and attached to constant gravity.    After an adequate timeout was performed, a Pfannenstiel skin incision was made with scalpel and carried through to the underlying layer of fascia. The fascia was incised in the midline, and this incision was extended bilaterally using the Mayo scissors.  Kocher clamps were applied to the superior aspect of the fascial incision and the underlying rectus muscles were dissected off bluntly.  A similar process was carried out on the inferior aspect of the fascial incision. The rectus muscles were separated in the midline bluntly and the peritoneum was entered bluntly. Attention was turned to the lower uterine segment where a low transverse hysterotomy was made with a scalpel and extended bilaterally bluntly.  The infant was successfully delivered with Mighty Vac vacuum assistance, the cord was clamped and cut after approximately 20 seconds due to poor skin color and poor respiratory effort, and the infant was handed over to the awaiting neonatology team. Uterine massage was then administered, and the placenta delivered intact with a three-vessel cord. The uterus was then cleared of clots and debris.  The hysterotomy was closed with 0 Vicryl in a running locked fashion, and an imbricating layer was also placed with 0 Vicryl.  Figure-of-eight 0 Vicryl serosal stitches were placed to help with hemostasis.  The pelvis was cleared of all clot and debris. Hemostasis was confirmed on all surfaces.  The peritoneum was closed with a 0 Vicryl running stitch. The fascia was then closed using 0 Vicryl a running fashion.  The subcutaneous layer was irrigated,  then reapproximated with 2-0 plain gut running stitches, and 30 ml of 0.5% Marcaine was injected  subcutaneously around the incision.  The skin was closed with a 4-0 Vicryl subcuticular stitch. A Prevena wound vacuum was placed post-operatively.   The patient tolerated the procedure well. Sponge, lap, instrument and needle counts were correct x 3.  She was taken to the recovery room in stable condition.   An experienced assistant was required given the standard of surgical care given the complexity of the case.  This assistant was needed for exposure, dissection, suctioning, retraction, instrument exchange, assisting with delivery with administration of fundal pressure, and for overall help during the procedure.   Maternal Disposition: PACU - hemodynamically stable.   Infant Disposition: stable   Marcy Siren, D.O. OB Fellow  01/30/2019, 6:16 PM

## 2019-01-30 NOTE — Anesthesia Preprocedure Evaluation (Signed)
Anesthesia Evaluation  Patient identified by MRN, date of birth, ID band Patient awake    Reviewed: Allergy & Precautions, H&P , Patient's Chart, lab work & pertinent test results  History of Anesthesia Complications Negative for: history of anesthetic complications  Airway Mallampati: III  TM Distance: >3 FB Neck ROM: full    Dental   Pulmonary neg pulmonary ROS,    Pulmonary exam normal breath sounds clear to auscultation       Cardiovascular hypertension,  Rhythm:regular Rate:Normal     Neuro/Psych negative neurological ROS  negative psych ROS   GI/Hepatic Neg liver ROS, GERD  ,  Endo/Other  Morbid obesity  Renal/GU negative Renal ROS     Musculoskeletal   Abdominal (+) + obese,   Peds  Hematology   Anesthesia Other Findings   Reproductive/Obstetrics (+) Pregnancy                             Anesthesia Physical  Anesthesia Plan  ASA: II  Anesthesia Plan: Epidural   Post-op Pain Management:    Induction:   PONV Risk Score and Plan:   Airway Management Planned:   Additional Equipment:   Intra-op Plan:   Post-operative Plan:   Informed Consent: I have reviewed the patients History and Physical, chart, labs and discussed the procedure including the risks, benefits and alternatives for the proposed anesthesia with the patient or authorized representative who has indicated his/her understanding and acceptance.       Plan Discussed with:   Anesthesia Plan Comments:         Anesthesia Quick Evaluation

## 2019-01-30 NOTE — Discharge Summary (Signed)
OB Discharge Summary     Patient Name: Vanessa BasemanBeverly Burnett Laszlo DOB: [birth year] MRN: [id number]  Date of admission: 01/28/2019 Delivering MD: Arvilla MarketWALLACE, Everitt Wenner LAUREN   Date of discharge: 02/02/2019  Admitting diagnosis: pregnancy Intrauterine pregnancy: 2264w2d     Secondary diagnosis:  Principal Problem:   S/P cesarean section Active Problems:   AMA (advanced maternal age) primigravida 35+   Chronic hypertension during pregnancy, antepartum   Previous gastric bypass affecting pregnancy, antepartum      Discharge diagnosis: Term Pregnancy Delivered                                                                                                Post partum procedures:None  Augmentation: AROM, Pitocin, Cytotec and Foley Balloon  Complications: None  Hospital course:  Induction of Labor With Cesarean Section  38 y.o. yo G4P0030 at 264w2d was admitted to the hospital 01/28/2019 for induction of labor. Patient had a labor course significant for NRFHT with recurrent deep variables as well as slow cervical dilation. The patient went for cesarean section due to Non-Reassuring FHR, and delivered a Viable infant,01/30/2019  Membrane Rupture Time/Date: 1:03 AM ,01/30/2019   Details of operation can be found in separate operative Note.  Patient had an uncomplicated postpartum course. BPs were elevated PP so she was started on Norvasc 10 mg with good blood pressure control. She is ambulating, tolerating a regular diet, passing flatus, and urinating well.  Patient is discharged home in stable condition on 02/02/19.                                    Physical exam  Vitals:   02/01/19 1015 02/01/19 1433 02/01/19 2216 02/02/19 0613  BP: 131/82 126/70 127/77 127/84  Pulse:  83 77 77  Resp:  16 14 16   Temp:  98.8 F (37.1 C) 98.8 F (37.1 C) 98.7 F (37.1 C)  TempSrc:  Oral  Oral  SpO2:  98% 99% 99%  Weight:      Height:       General: alert and cooperative Lochia: appropriate Uterine Fundus:  firm Incision: Prevena in place. Dressing clean/dry/intact.  DVT Evaluation: No evidence of DVT seen on physical exam. Labs: Lab Results  Component Value Date   WBC 15.4 (H) 01/31/2019   HGB 9.9 (L) 01/31/2019   HCT 29.5 (L) 01/31/2019   MCV 89.4 01/31/2019   PLT 140 (L) 01/31/2019   CMP Latest Ref Rng & Units 01/31/2019  Glucose 70 - 99 mg/dL -  BUN 6 - 20 mg/dL -  Creatinine 7.820.44 - 9.561.00 mg/dL 2.130.67  Sodium 086135 - 578145 mmol/L -  Potassium 3.5 - 5.1 mmol/L -  Chloride 98 - 111 mmol/L -  CO2 22 - 32 mmol/L -  Calcium 8.9 - 10.3 mg/dL -  Total Protein 6.5 - 8.1 g/dL -  Total Bilirubin 0.3 - 1.2 mg/dL -  Alkaline Phos 38 - 469126 U/L -  AST 15 - 41 U/L -  ALT 0 - 44 U/L -  Discharge instruction: per After Visit Summary and "Baby and Me Booklet".  After visit meds:  Allergies as of 02/02/2019      Reactions   Other    "anything antibacterial"; some creams for dermatitis   Penicillins Itching, Rash      Medication List    STOP taking these medications   promethazine 12.5 MG tablet Commonly known as:  PHENERGAN   Vitamin D (Ergocalciferol) 1.25 MG (50000 UT) Caps capsule Commonly known as:  DRISDOL     TAKE these medications   acetaminophen 500 MG tablet Commonly known as:  TYLENOL Take 500 mg by mouth as needed for moderate pain.   amLODipine 10 MG tablet Commonly known as:  NORVASC Take 1 tablet (10 mg total) by mouth daily.   ferrous sulfate 325 (65 FE) MG tablet Take 1 tablet (325 mg total) by mouth 2 (two) times daily with a meal.   oxyCODONE 5 MG immediate release tablet Commonly known as:  Oxy IR/ROXICODONE Take 1-2 tablets (5-10 mg total) by mouth every 4 (four) hours as needed for moderate pain.   PrePLUS 27-1 MG Tabs Take 1 tablet by mouth daily.   senna-docusate 8.6-50 MG tablet Commonly known as:  Senokot-S Take 2 tablets by mouth daily. Start taking on:  February 03, 2019      Postpartum Message  Please schedule this patient for Postpartum visit  in: 4 weeks with the following provider: Any provider For C/S patients schedule nurse incision check in weeks 2 weeks: yes High risk pregnancy complicated by: cHTN (no meds) with suboptimal fetal growth (11%), h/o gastric bypass Delivery mode:  CS Anticipated Birth Control:  Declines PP Procedures needed: BP check, incision check  Schedule Integrated BH visit: no    Diet: routine diet  Activity: Advance as tolerated. Pelvic rest for 6 weeks.   Outpatient follow up:4 weeks Follow up Appt:No future appointments. Follow up Visit:No follow-ups on file.  Postpartum contraception: Declines  Newborn Data: Live born female  Birth Weight:   APGAR: 6, 9  Newborn Delivery   Birth date/time:  01/30/2019 17:30:00 Delivery type:  C-Section, Low Transverse Trial of labor:  No C-section categorization:  Primary     Baby Feeding: Breast Disposition:home with mother   02/02/2019 De Hollingshead, DO

## 2019-01-30 NOTE — Progress Notes (Signed)
LABOR PROGRESS NOTE  Vanessa Braun is a 38 y.o. G4P0030 at [redacted]w[redacted]d admitted for IOL for cHTN  Subjective: Epidural in place  Objective: BP 109/60   Pulse (!) 56   Temp 98.5 F (36.9 C) (Oral)   Resp 18   Ht 5\' 9"  (1.753 m)   Wt 110.7 kg   LMP 05/07/2018   SpO2 100%   BMI 36.04 kg/m  or  Vitals:   01/30/19 0429 01/30/19 0501 01/30/19 0531 01/30/19 0601  BP: 103/66 120/66 (!) 106/59 109/60  Pulse: (!) 58 (!) 52 (!) 54 (!) 56  Resp: 18 18 18 18   Temp:      TempSrc:      SpO2:      Weight:      Height:        Dilation: 4.5 Effacement (%): 60 Station: -1 Presentation: Vertex Exam by:: Fabio Neighbors RN  FHT: baseline rate 145, minimal to moderate varibility, + acel, no decel (late and prolonged earlier) Toco: MVUs inadequate  Labs: Lab Results  Component Value Date   WBC 9.7 01/30/2019   HGB 11.2 (L) 01/30/2019   HCT 34.9 (L) 01/30/2019   MCV 88.4 01/30/2019   PLT 162 01/30/2019    Patient Active Problem List   Diagnosis Date Noted  . Hypertension in pregnancy, antepartum 01/28/2019  . Vitamin D deficiency 07/25/2018  . Supervision of high risk pregnancy, antepartum 07/21/2018  . Chronic hypertension during pregnancy, antepartum 07/21/2018  . Previous gastric bypass affecting pregnancy, antepartum 07/21/2018  . 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 AMA (advanced maternal age) primigravida 35+ 07/02/2018  . Eczema of both hands 08/16/2014  . Obesity 02/11/2012    Assessment / Plan: 38 y.o. G4P0030 at [redacted]w[redacted]d here for IOL for cHTN  Labor: was on pitocin for nearly 24 hours. After epidural, had several deep decelerations. Pitocin stopped. Will break at least 4 hours and restart. Stopped around 430am Fetal Wellbeing:  Cat 2 (reassuring) Pain Control:  Epidural  Anticipated MOD:  SVD  Yvon Mccord,MD OB Fellow  01/30/2019, 6:33 AM

## 2019-01-30 NOTE — Transfer of Care (Signed)
Immediate Anesthesia Transfer of Care Note  Patient: Vanessa Braun  Procedure(s) Performed: CESAREAN SECTION (N/A Abdomen)  Patient Location: PACU  Anesthesia Type:Epidural  Level of Consciousness: awake  Airway & Oxygen Therapy: Patient Spontanous Breathing  Post-op Assessment: Report given to RN and Post -op Vital signs reviewed and stable  Post vital signs: Reviewed and stable  Last Vitals:  Vitals Value Taken Time  BP    Temp    Pulse    Resp    SpO2      Last Pain:  Vitals:   01/30/19 1631  TempSrc: Oral  PainSc:          Complications: No apparent anesthesia complications

## 2019-01-30 NOTE — Progress Notes (Signed)
Patient ID: Vanessa Braun, female   DOB: 1981-03-17, 38 y.o.   MRN: [id number] Labor Progress Note Vanessa Braun is a 38 y.o. G4P0030 at [redacted]w[redacted]d presented for IOL for cHTN with sub-optimal growth and borderline IUGR  S: called to room regarding decreased fetal heart rate and concern by RN due to no return o recently placed . Upon entry Pitocin was stopped.  O:  BP 117/73   Pulse (!) 50   Temp 98.4 F (36.9 C) (Oral)   Resp 16   Ht 5\' 9"  (1.753 m)   Wt 110.7 kg   LMP 05/07/2018   SpO2 100%   BMI 36.04 kg/m  EFM: 120/mod/no accels, + decel with a nadir in the 60s with slow return to baseline. Total decel time was ~ 2 minutes.   Performed position changes, removed IUPC, stopped Pitocin, placed O2, and gave terbutaline.   CVE: Dilation: 4.5 Effacement (%): 70 Cervical Position: Anterior Station: -1 Presentation: Vertex Exam by:: Ozell Ferrera MD   A&P: 38 y.o. G4P0030 at [redacted]w[redacted]d presented for IOL for cHTN with sub-optimal growth and borderline IUGR  #Labor: Protracted 1st stage. Concern for fetal intolerance. Patient received pitocin break earlier in the morning on 4/7. Pitocin to remain off for at least 30 minutes and restart at 1/2 last dose.  Discussed possibility of C-section. The risks of cesarean section were discussed with the patient including but were not limited to: bleeding which may require transfusion or reoperation; infection which may require antibiotics; injury to bowel, bladder, ureters or other surrounding organs; injury to the fetus; need for additional procedures including hysterectomy in the event of a life-threatening hemorrhage; placental abnormalities wth subsequent pregnancies, incisional problems, thromboembolic phenomenon and other postoperative/anesthesia complications.   #Pain: Epidural #FWB: Cat 2-concerning given need for pit break prior if FHR is not reassuring fetal status. After pit break, if non-reassuring will re-address need for CS.  #GBS  negative  Federico Flake, MD 2:10 PM

## 2019-01-31 ENCOUNTER — Encounter: Payer: BLUE CROSS/BLUE SHIELD | Admitting: Obstetrics & Gynecology

## 2019-01-31 LAB — CBC
HCT: 29.5 % — ABNORMAL LOW (ref 36.0–46.0)
Hemoglobin: 9.9 g/dL — ABNORMAL LOW (ref 12.0–15.0)
MCH: 30 pg (ref 26.0–34.0)
MCHC: 33.6 g/dL (ref 30.0–36.0)
MCV: 89.4 fL (ref 80.0–100.0)
Platelets: 140 10*3/uL — ABNORMAL LOW (ref 150–400)
RBC: 3.3 MIL/uL — ABNORMAL LOW (ref 3.87–5.11)
RDW: 12.7 % (ref 11.5–15.5)
WBC: 15.4 10*3/uL — ABNORMAL HIGH (ref 4.0–10.5)
nRBC: 0 % (ref 0.0–0.2)

## 2019-01-31 LAB — CREATININE, SERUM
Creatinine, Ser: 0.67 mg/dL (ref 0.44–1.00)
GFR calc Af Amer: >60 mL/min (ref >60)
GFR calc non Af Amer: >60 mL/min (ref >60)

## 2019-01-31 NOTE — Progress Notes (Signed)
Subjective: Postpartum Day 1: Cesarean Delivery Patient reports incisional pain, tolerating PO and + flatus.    Objective: Vital signs in last 24 hours: Temp:  [98.3 F (36.8 C)-98.9 F (37.2 C)] 98.4 F (36.9 C) (04/08 0405) Pulse Rate:  [50-102] 70 (04/08 0405) Resp:  [16-29] 20 (04/08 0405) BP: (102-144)/(52-94) 123/69 (04/08 0405) SpO2:  [94 %-100 %] 99 % (04/08 0405)  Physical Exam:  General: alert, cooperative and no distress Lochia: appropriate Uterine Fundus: firm Incision: no significant drainage DVT Evaluation: No evidence of DVT seen on physical exam.  Recent Labs    01/30/19 1926 01/31/19 0521  HGB 10.9* 9.9*  HCT 33.1* 29.5*    Assessment/Plan: Status post Cesarean section. Doing well postoperatively.  Continue current care.  Scheryl Darter 01/31/2019, 8:37 AM

## 2019-01-31 NOTE — Lactation Note (Signed)
This note was copied from a baby's chart. Lactation Consultation Note  Patient Name: [name] [id number] Date: 01/31/2019 Reason for consult: Follow-up assessment;Early term 37-38.6wks;Infant < 6lbs;Primapara;1st time breastfeeding  P1 mother whose infant is now 63 hours old.  This is an ETI at 38+2 weeks weighing < 6 lbs.  Baby is SGA and IUGR.  Baby was asleep in mother's arms when I arrived.  She last fed approximately one hour ago.  Mother had the DEBP set up in her room but was very unclear on how to use it.  Reviewed pump parts and settings.  #24 flange size changed to a #27 for greater comfort and fit.  Observed mother pumping and answered basic breast feeding questions during her pumping session.  Mother has pumped only once today and this was very early in the morning.  Emphasized the importance of pumping after every breast feeding to help stimulate milk production.  Advised mother that baby needs to supplement according to volume guidelines after every feeding.  Mother was not aware of this.  Also suggested she do hand expression before/after feedings to help increase milk supply.  Finger feeding demonstrated.  Mother encouraged to call her RN for latch assistance as needed and for RN to observe latching.  She will be diligent about pumping after every feed and will use the curved tip syringe for supplementation after every feeding.  Mother will call for assistance as needed.    Reminded mother to feed 8-12 times/24 hours or sooner if baby shows cues.  Mother stated that she awakens to eat every 2 hours and feels like latching is going well.  Mother enjoys doing STS and I encouraged this as much as possible.  Mother had lanolin ointment in room which I discouraged and gave reasons why this is not the most appropriate nipple cream.  Suggested coconut oil instead but mother informed me that she cannot use coconut oil.  I suggested EBM and mother will begin doing this.     Maternal  Data Formula Feeding for Exclusion: No Has patient been taught Hand Expression?: Yes Does the patient have breastfeeding experience prior to this delivery?: No  Feeding Feeding Type: Breast Fed  LATCH Score                   Interventions    Lactation Tools Discussed/Used WIC Program: Yes Pump Review: Setup, frequency, and cleaning;Milk Storage Initiated by:: Reviewed; mother had many questions   Consult Status Consult Status: Follow-up Date: 02/01/19 Follow-up type: In-patient    Dora Sims 01/31/2019, 5:54 PM

## 2019-01-31 NOTE — Anesthesia Postprocedure Evaluation (Signed)
Anesthesia Post Note  Patient: Vanessa Braun  Procedure(s) Performed: CESAREAN SECTION (N/A Abdomen)     Patient location during evaluation: Mother Baby Anesthesia Type: Epidural Level of consciousness: awake and alert Pain management: pain level controlled Vital Signs Assessment: post-procedure vital signs reviewed and stable Respiratory status: spontaneous breathing, nonlabored ventilation and respiratory function stable Cardiovascular status: stable Postop Assessment: no headache, no backache and epidural receding Anesthetic complications: no    Last Vitals:  Vitals:   01/31/19 0842 01/31/19 1618  BP: 125/68 129/80  Pulse: 60 80  Resp: 18 18  Temp: 36.8 C 36.8 C  SpO2: 99% 100%    Last Pain:  Vitals:   01/31/19 1753  TempSrc:   PainSc: 4    Pain Goal: Patients Stated Pain Goal: 3 (01/31/19 0600)                 Lowella Curb

## 2019-02-01 ENCOUNTER — Encounter (HOSPITAL_COMMUNITY): Payer: Self-pay | Admitting: Family Medicine

## 2019-02-01 MED ORDER — FERROUS SULFATE 325 (65 FE) MG PO TABS
325.0000 mg | ORAL_TABLET | Freq: Two times a day (BID) | ORAL | Status: DC
  Administered 2019-02-01 – 2019-02-02 (×3): 325 mg via ORAL
  Filled 2019-02-01 (×3): qty 1

## 2019-02-01 MED ORDER — DOCUSATE SODIUM 100 MG PO CAPS
100.0000 mg | ORAL_CAPSULE | Freq: Two times a day (BID) | ORAL | Status: DC
  Administered 2019-02-02: 10:00:00 100 mg via ORAL
  Filled 2019-02-01 (×3): qty 1

## 2019-02-01 MED ORDER — AMLODIPINE BESYLATE 5 MG PO TABS
10.0000 mg | ORAL_TABLET | Freq: Every day | ORAL | Status: DC
  Administered 2019-02-01 – 2019-02-02 (×2): 10 mg via ORAL
  Filled 2019-02-01 (×2): qty 2

## 2019-02-01 NOTE — Progress Notes (Signed)
Postpartum Day 2: Cesarean Delivery  Subjective: Patient reports mild incisional pain, tolerating PO and + flatus.   Patient denies any headaches, visual symptoms, RUQ/epigastric pain or other concerning symptoms. Baby is stable at bedside.  Objective: Vital signs in last 24 hours: Temp:  [98.2 F (36.8 C)-98.7 F (37.1 C)] 98.7 F (37.1 C) (04/09 0548) Pulse Rate:  [80-85] 85 (04/09 0548) Resp:  [16-18] 16 (04/09 0548) BP: (122-151)/(80-105) 131/82 (04/09 1015) SpO2:  [100 %] 100 % (04/09 0548)  Vitals:   01/31/19 2100 02/01/19 0548 02/01/19 0600 02/01/19 1015  BP: 122/86 (!) 151/105 (!) 145/92 131/82  Pulse: 85 85    Resp: 18 16    Temp: 98.2 F (36.8 C) 98.7 F (37.1 C)    TempSrc: Oral Oral    SpO2:  100%    Weight:      Height:        Physical Exam:  General: alert, cooperative and no distress Lochia: appropriate Uterine Fundus: firm Incision: no significant drainage, Prevena in place DVT Evaluation: No evidence of DVT seen on physical exam.  Recent Labs    01/30/19 1926 01/31/19 0521  HGB 10.9* 9.9*  HCT 33.1* 29.5*    Assessment/Plan: Status post Cesarean section. Doing well postoperatively.  Elevated BP this morning, started Norvasc 10 mg daily. Will monitor today. If stable and not increasing further, will send home tomorrow. Message sent to [facility] office for in-person appt for Prevena removal, incision check and BP check (RN visit) on 02/06/19 Continue current care.  Jaynie Collins, MD 02/01/2019, 11:04 AM

## 2019-02-01 NOTE — Lactation Note (Addendum)
This note was copied from a baby's chart. Lactation Consultation Note  Patient Name: Vanessa Braun [id number] Date: 02/01/2019 Reason for consult: Follow-up assessment  Infant is 2 hours old. Mom reports infant is feeding frequently, but can be content for as long as 2 hours between feedings during the day, 3 hrs at night. Infant was cueing when I walked into the room; infant latched very easily & frequent swallows were noted (verified by cervical auscultation). Mom is comfortable with latch. She denies any increase in breast heaviness at this time. Weight loss is almost 7%, but multiple voids & stools have been recorded.   Mom reports that her blood labs (s/p Roux-en-y) are done annually. She said that her blood labs have been fine, except for her Vit D levels. Mom thinks they will be repeated again in June or July. If Mom is able to exclusively breastfeed, I suggested that Mom ask her pediatrician if "Vanessa Braun" should be supplemented with Vit B12 . Mont Dutton, NP was made aware that Vit B12 deficiency can result in exclusively breast milk-fed infants whose mothers have had malabsorptive gastric surgery.  Mom noted to be on amlodipine 10mg  qd (L3).  Lurline Hare [facility] 02/01/2019, 5:41 PM

## 2019-02-02 ENCOUNTER — Encounter (HOSPITAL_COMMUNITY): Payer: Self-pay | Admitting: *Deleted

## 2019-02-02 MED ORDER — OXYCODONE HCL 5 MG PO TABS: 5.0000 mg | ORAL_TABLET | ORAL | 0 refills | Status: DC | PRN

## 2019-02-02 MED ORDER — AMLODIPINE BESYLATE 10 MG PO TABS: 10.0000 mg | ORAL_TABLET | Freq: Every day | ORAL | 0 refills | Status: DC

## 2019-02-02 MED ORDER — FERROUS SULFATE 325 (65 FE) MG PO TABS: 325.0000 mg | ORAL_TABLET | Freq: Two times a day (BID) | ORAL | 3 refills | Status: DC

## 2019-02-02 MED ORDER — SENNOSIDES-DOCUSATE SODIUM 8.6-50 MG PO TABS: 2.0000 | ORAL_TABLET | ORAL | 0 refills | Status: DC

## 2019-02-02 NOTE — Discharge Instructions (Signed)
Home Care Instructions for Mom  Activity  · Gradually return to your regular activities.  · Let yourself rest. Nap while your baby sleeps.  · Avoid lifting anything that is heavier than 10 lb (4.5 kg) until your health care provider says it is okay.  · Avoid activities that take a lot of effort and energy (are strenuous) until approved by your health care provider. Walking at a slow-to-moderate pace is usually safe.  · If you had a cesarean delivery:  ? Do not vacuum, climb stairs, or drive a car for 4-6 weeks.  ? Have someone help you at home until you feel like you can do your usual activities yourself.  ? Do exercises as told by your health care provider, if this applies.  Vaginal bleeding  You may continue to bleed for 4-6 weeks after delivery. Over time, the amount of blood usually decreases and the color of the blood usually gets lighter. However, the flow of bright red blood may increase if you have been too active. If you need to use more than one pad in an hour because your pad gets soaked, or if you pass a large clot:  · Lie down.  · Raise your feet.  · Place a cold compress on your lower abdomen.  · Rest.  · Call your health care provider.  If you are breastfeeding, your period should return anytime between 8 weeks after delivery and the time that you stop breastfeeding. If you are not breastfeeding, your period should return 6-8 weeks after delivery.  Perineal care  The perineal area, or perineum, is the part of your body between your thighs. After delivery, this area needs special care. Follow these instructions as told by your health care provider.  · Take warm tub baths for 15-20 minutes.  · Use medicated pads and pain-relieving sprays and creams as told.  · Do not use tampons or douches until vaginal bleeding has stopped.  · Each time you go to the bathroom:  ? Use a peri bottle.  ? Change your pad.  ? Use towelettes in place of toilet paper until your stitches have healed.  · Do Kegel exercises  every day. Kegel exercises help to maintain the muscles that support the vagina, bladder, and bowels. You can do these exercises while you are standing, sitting, or lying down. To do Kegel exercises:  ? Tighten the muscles of your abdomen and the muscles that surround your birth canal.  ? Hold for a few seconds.  ? Relax.  ? Repeat until you have done this 5 times in a row.  · To prevent hemorrhoids from developing or getting worse:  ? Drink enough fluid to keep your urine clear or pale yellow.  ? Avoid straining when having a bowel movement.  ? Take over-the-counter medicines and stool softeners as told by your health care provider.  Breast care  · Wear a tight-fitting bra.  · Avoid taking over-the-counter pain medicine for breast discomfort.  · Apply ice to the breasts to help with discomfort as needed:  ? Put ice in a plastic bag.  ? Place a towel between your skin and the bag.  ? Leave the ice on for 20 minutes or as told by your health care provider.  Nutrition  · Eat a well-balanced diet.  · Do not try to lose weight quickly by cutting back on calories.  · Take your prenatal vitamins until your postpartum checkup or until your health care provider tells   you to stop.  Postpartum depression  You may find yourself crying for no apparent reason and unable to cope with all of the changes that come with having a newborn. This mood is called postpartum depression. Postpartum depression happens because your hormone levels change after delivery. If you have postpartum depression, get support from your partner, friends, and family. If the depression does not go away on its own after several weeks, contact your health care provider.  Breast self-exam    Do a breast self-exam each month, at the same time of the month. If you are breastfeeding, check your breasts just after a feeding, when your breasts are less full. If you are breastfeeding and your period has started, check your breasts on day 5, 6, or 7 of your  period.  Report any lumps, bumps, or discharge to your health care provider. Know that breasts are normally lumpy if you are breastfeeding. This is temporary, and it is not a health risk.  Intimacy and sexuality  Avoid sexual activity for at least 3-4 weeks after delivery or until the brownish-red vaginal flow is completely gone. If you want to avoid pregnancy, use some form of birth control. You can get pregnant after delivery, even if you have not had your period.  Contact a health care provider if:  · You feel unable to cope with the changes that a child brings to your life, and these feelings do not go away after several weeks.  · You notice a lump, a bump, or discharge on your breast.  Get help right away if:  · Blood soaks your pad in 1 hour or less.  · You have:  ? Severe pain or cramping in your lower abdomen.  ? A bad-smelling vaginal discharge.  ? A fever that is not controlled by medicine.  ? A fever, and an area of your breast is red and sore.  ? Pain or redness in your calf.  ? Sudden, severe chest pain.  ? Shortness of breath.  ? Painful or bloody urination.  ? Problems with your vision.  ? You vomit for 12 hours or longer.  ? You develop a severe headache.  ? You have serious thoughts about hurting yourself, your child, or anyone else.  This information is not intended to replace advice given to you by your health care provider. Make sure you discuss any questions you have with your health care provider.  Document Released: 10/08/2000 Document Revised: 12/07/2017 Document Reviewed: 04/14/2015  Elsevier Interactive Patient Education © 2019 [employer]

## 2019-02-03 ENCOUNTER — Ambulatory Visit: Payer: Self-pay

## 2019-02-03 NOTE — Lactation Note (Signed)
This note was copied from a baby's chart. Lactation Consultation Note  Patient Name: Vanessa Braun [id number] Date: 02/03/2019 Reason for consult: Follow-up assessment;Infant < 6lbs   Baby 87 hours old.  < 6 lbs.  Mother pumped 50 ml this morning of transitional breastmilk.  Stools are green. Mother will convert DEBP to manual for home use and plans to buy a DEBP. Mother is breastfeeding and supplementing after. Encouraged her to continue pumping q 3 hours. Suggest mother call if she needs assistance today with breastfeeding. Feed on demand approximately 8-12 times per day at least q 3 hours.   Reviewed engorgement care and monitoring voids/stools.    Maternal Data    Feeding Feeding Type: Breast Fed  LATCH Score                   Interventions Interventions: DEBP  Lactation Tools Discussed/Used     Consult Status Consult Status: Complete Date: 02/03/19    Vanessa Braun [facility] 02/03/2019, 9:20 AM

## 2019-02-06 ENCOUNTER — Telehealth: Payer: Self-pay | Admitting: Family Medicine

## 2019-02-06 NOTE — Telephone Encounter (Signed)
Attempted to call patient with the office's new address and procedure changes. No answer, left detailed message with this information.

## 2019-02-07 ENCOUNTER — Other Ambulatory Visit: Payer: Self-pay

## 2019-02-07 ENCOUNTER — Ambulatory Visit (INDEPENDENT_AMBULATORY_CARE_PROVIDER_SITE_OTHER): Payer: BLUE CROSS/BLUE SHIELD

## 2019-02-07 VITALS — BP 130/72 | HR 88 | Wt 228.6 lb

## 2019-02-07 DIAGNOSIS — Z5189 Encounter for other specified aftercare: Secondary | ICD-10-CM

## 2019-02-07 NOTE — Progress Notes (Signed)
Pt here today for incision check and wound vac removal.  Pt tolerated wound vac removal well.  Incision well approximated, no odor, no drainage, and no redness.  Pt advised to continue to monitor for signs of infection.  Pt verbalized understanding with no further questions.

## 2019-02-07 NOTE — Progress Notes (Signed)
Patient here today for wound check and wound vac removal following cesarean section. RN asked me to evaluate incision. Incision is well approximated with a small 2 cm area with superficial dehiscence. Silver nitrate and sterri stips applied. No drainage or erythema noted.    Duane Lope, NP 02/07/2019 8:48 PM

## 2019-02-16 ENCOUNTER — Telehealth (INDEPENDENT_AMBULATORY_CARE_PROVIDER_SITE_OTHER): Payer: BLUE CROSS/BLUE SHIELD | Admitting: Obstetrics and Gynecology

## 2019-02-16 ENCOUNTER — Telehealth: Payer: Self-pay | Admitting: Obstetrics and Gynecology

## 2019-02-16 ENCOUNTER — Other Ambulatory Visit: Payer: Self-pay

## 2019-02-16 ENCOUNTER — Inpatient Hospital Stay (HOSPITAL_COMMUNITY)
Admission: AD | Admit: 2019-02-16 | Discharge: 2019-02-16 | Disposition: A | Discharge status: Home / Self Care | Payer: BLUE CROSS/BLUE SHIELD | Attending: Obstetrics and Gynecology | Admitting: Obstetrics and Gynecology

## 2019-02-16 DIAGNOSIS — Z9884 Bariatric surgery status: Secondary | ICD-10-CM | POA: Insufficient documentation

## 2019-02-16 DIAGNOSIS — Z79899 Other long term (current) drug therapy: Secondary | ICD-10-CM | POA: Insufficient documentation

## 2019-02-16 DIAGNOSIS — I10 Essential (primary) hypertension: Secondary | ICD-10-CM | POA: Insufficient documentation

## 2019-02-16 DIAGNOSIS — Z8249 Family history of ischemic heart disease and other diseases of the circulatory system: Secondary | ICD-10-CM | POA: Diagnosis not present

## 2019-02-16 DIAGNOSIS — O909 Complication of the puerperium, unspecified: Secondary | ICD-10-CM

## 2019-02-16 DIAGNOSIS — Z9889 Other specified postprocedural states: Secondary | ICD-10-CM

## 2019-02-16 NOTE — Telephone Encounter (Signed)
Returned pt's call regarding her surgical incision. Pt reports that when she removed the steri strips, her incision is open and having clear/yellow drainage.  Advised pt that, due to her description of the site, her concern of it being open, and the inability to bring her in the office today to have someone check the incision, she needs to go to MAU to have it evaluated.  After hearing this recommendation, pt states that she wasn't sure if it was open or not because it was hard to see.  Advised her to have another person look at her incision site and that, if it all looked lined up and together and did not have any pus coming from it then she is ok to wait but that if any part of it, no matter the size, is open, that she needs to go have it evaluated.  Pt verbalized understanding.

## 2019-02-16 NOTE — MAU Note (Signed)
Pt delivered 4/7. Had a wound check on 4/15, took off wound vac. Now feels like she had some "sweating and leaking". Having pain when she lays down rates 4/10.

## 2019-02-16 NOTE — Telephone Encounter (Signed)
The patient called in stating her incision is leaking a fluid and almost all of her stitches have came off. She stated she would like to speak with a nurse. Also scheduled an face to face visit with a nurse. Sending a message to Dr. Shawnie Pons to ensure the patient can come in.

## 2019-02-16 NOTE — MAU Provider Note (Signed)
History     CSN: [id number]  Arrival date and time: 02/16/19 1257   First Provider Initiated Contact with Patient 02/16/19 1346      Chief Complaint  Patient presents with  . Drainage from Incision   38 y.o female 2.5 weeks post CS presenting with drainage from wound. First noticed clear-yellow drainage about 4 days ago from right side of incision. Area is tender to clothes touching it. No bleeding, erythema, or edema. No fevers or chills.    OB History    Gravida  4   Para  0   Term  0   Preterm  0   AB  3   Living  0     SAB  3   TAB  0   Ectopic  0   Multiple  0   Live Births  0           Past Medical History:  Diagnosis Date  . Abnormal Pap smear   . Chronic dermatitis of hands   . Complication of anesthesia    trouble waking up  . Eczema   . GERD (gastroesophageal reflux disease)    lost weight with bypass surgery, no problems since  . Hypertension   . PCOS (polycystic ovarian syndrome)     Past Surgical History:  Procedure Laterality Date  . ADENOIDECTOMY    . CESAREAN SECTION N/A 01/30/2019   Procedure: CESAREAN SECTION;  Surgeon: Federico Flake, MD;  Location: [facility];  Service: Obstetrics;  Laterality: N/A;  . DILATION AND CURETTAGE OF UTERUS    . DILATION AND EVACUATION N/A 04/01/2014   Procedure: DILATATION AND EVACUATION;  Surgeon: Robley Fries, MD;  Location: [facility] ORS;  Service: Gynecology;  Laterality: N/A;  . DILATION AND EVACUATION N/A 12/23/2017   Procedure: DILATATION AND EVACUATION;  Surgeon: Willodean Rosenthal, MD;  Location: [facility] ORS;  Service: Gynecology;  Laterality: N/A;  . GASTRIC BYPASS    . TONSILLECTOMY AND ADENOIDECTOMY  1999    Family History  Problem Relation Age of Onset  . Diabetes Mother   . Hyperlipidemia Mother   . Hypertension Mother   . Diabetes Father   . Kidney disease Father   . Hypertension Father   . Hyperlipidemia Father   . Cancer Father        pancreatic  . Gestational diabetes  Sister   . Depression Sister   . Diabetes Sister   . Heart disease Maternal Grandmother   . Heart disease Paternal Grandmother     Social History   Tobacco Use  . Smoking status: Never Smoker  . Smokeless tobacco: Never Used  Substance Use Topics  . Alcohol use: Not Currently    Frequency: Never  . Drug use: No    Allergies:  Allergies  Allergen Reactions  . Other     "anything antibacterial"; some creams for dermatitis  . Penicillins Itching and Rash    Medications Prior to Admission  Medication Sig Dispense Refill Last Dose  . acetaminophen (TYLENOL) 500 MG tablet Take 500 mg by mouth as needed for moderate pain.   Past Week at Unknown time  . amLODipine (NORVASC) 10 MG tablet Take 1 tablet (10 mg total) by mouth daily. 30 tablet 0   . ferrous sulfate 325 (65 FE) MG tablet Take 1 tablet (325 mg total) by mouth 2 (two) times daily with a meal. 60 tablet 3   . oxyCODONE (OXY IR/ROXICODONE) 5 MG immediate release tablet Take 1-2 tablets (5-10  mg total) by mouth every 4 (four) hours as needed for moderate pain. 30 tablet 0   . Prenatal Vit-Fe Fumarate-FA (PREPLUS) 27-1 MG TABS Take 1 tablet by mouth daily. 30 tablet 6 Past Week at Unknown time  . senna-docusate (SENOKOT-S) 8.6-50 MG tablet Take 2 tablets by mouth daily. 20 tablet 0     Review of Systems  Constitutional: Negative for fever.  Gastrointestinal: Negative for abdominal pain.  Skin: Positive for wound.   Physical Exam   Blood pressure 119/74, pulse 62, temperature 98.3 F (36.8 C), temperature source Oral, resp. rate 16, weight 105.6 kg, SpO2 100 %, unknown if currently breastfeeding.  Physical Exam  Nursing note and vitals reviewed. Constitutional: She is oriented to person, place, and time. She appears well-developed and well-nourished. No distress.  HENT:  Head: Normocephalic and atraumatic.  Neck: Normal range of motion.  Cardiovascular: Normal rate.  Respiratory: No respiratory distress.  GI: Soft.  She exhibits no distension. There is no abdominal tenderness.    Musculoskeletal: Normal range of motion.  Neurological: She is alert and oriented to person, place, and time.  Skin: Skin is warm and dry.  Psychiatric: She has a normal mood and affect.   No results found for this or any previous visit (from the past 24 hour(s)).  MAU Course  Procedures  MDM Do not recommend any treatment at this time. No evidence of infection, would should heal by secondary intention. Recommend keeping clean and dry with bandaid or or gauze. Discussed warning signs. Stable for discharge home.   Assessment and Plan   1. Cesarean section wound complication    [facility] Return precautions  Allergies as of 02/16/2019      Reactions   Other    "anything antibacterial"; some creams for dermatitis   Penicillins Itching, Rash      Medication List    TAKE these medications   acetaminophen 500 MG tablet Commonly known as:  TYLENOL Take 500 mg by mouth as needed for moderate pain.   amLODipine 10 MG tablet Commonly known as:  NORVASC Take 1 tablet (10 mg total) by mouth daily.   ferrous sulfate 325 (65 FE) MG tablet Take 1 tablet (325 mg total) by mouth 2 (two) times daily with a meal.   oxyCODONE 5 MG immediate release tablet Commonly known as:  Oxy IR/ROXICODONE Take 1-2 tablets (5-10 mg total) by mouth every 4 (four) hours as needed for moderate pain.   PrePLUS 27-1 MG Tabs Take 1 tablet by mouth daily.   senna-docusate 8.6-50 MG tablet Commonly known as:  Senokot-S Take 2 tablets by mouth daily.      Donette LarryMelanie Alexandro Line, CNM 02/16/2019, 2:10 PM

## 2019-02-19 ENCOUNTER — Telehealth: Payer: Self-pay | Admitting: Advanced Practice Midwife

## 2019-02-19 ENCOUNTER — Ambulatory Visit: Payer: BLUE CROSS/BLUE SHIELD

## 2019-02-19 NOTE — Telephone Encounter (Signed)
The patient stated she was told to go the hospital to be seen. She called in to cancel her appointment because she has already been seen.

## 2019-03-06 ENCOUNTER — Other Ambulatory Visit: Payer: Self-pay

## 2019-03-06 ENCOUNTER — Ambulatory Visit (INDEPENDENT_AMBULATORY_CARE_PROVIDER_SITE_OTHER): Payer: BLUE CROSS/BLUE SHIELD | Admitting: Student

## 2019-03-06 DIAGNOSIS — O10919 Unspecified pre-existing hypertension complicating pregnancy, unspecified trimester: Secondary | ICD-10-CM

## 2019-03-06 NOTE — Progress Notes (Signed)
I connected with  Vanessa Braun on 03/06/19 at  1:15 PM EDT by telephone and verified that I am speaking with the correct person using two identifiers.   I discussed the limitations, risks, security and privacy concerns of performing an evaluation and management service by telephone and the availability of in person appointments. I also discussed with the patient that there may be a patient responsible charge related to this service. The patient expressed understanding and agreed to proceed.  Vanessa Braun CMA   I connected with@ on 03/06/19 at  1:15 PM EDT by: WebExand verified that I am speaking with the correct person using two identifiers.  Patient is located at home and provider is located at [place] office.    The purpose of this virtual visit is to provide medical care while limiting exposure to the novel coronavirus. I discussed the limitations, risks, security and privacy concerns of performing an evaluation and management service by Webex and the availability of in person appointments. I also discussed with the patient that there may be a patient responsible charge related to this service. By engaging in this virtual visit, you consent to the provision of healthcare.  Additionally, you authorize for your insurance to be billed for the services provided during this visit.  The patient expressed understanding and agreed to proceed.  The following staff members participated in the virtual visit:  Vanessa Braun      VIRTUAL POSTPARTUM VISIT ENCOUNTER NOTE  Appointment Date: 03/06/2019  [facility]  Chief Complaint:  Chief Complaint  Patient presents with  . Postpartum Care    History of Present Illness: Vanessa Braun is a 38 y.o. African-American G4P0030 (No LMP recorded.), seen for the above chief complaint. Her past medical history is significant for chronic hypertension, multiple miscarriages and pLTCS   She is s/p primary cesarean section on  4-7 at 38 weeks;  she was discharged to home on Norvasc 10 mg daily for chronic hypertension. Pregnancy complicated by chronic hypertension. Baby is doing well;feeding by bottle.   Complains of still some occasional exudate from her incision. Was in seen in MAU for this and was told to keep it clean and dry; she feels it is slowly getting better.   She has finished her Norvasc; she does not have any way to check her BP. States that she has no HA, blurry vision, floating spots, RUQ.   Vaginal bleeding or discharge: No  Mode of feeding infant: Bottle Intercourse: Yes  Contraception: no method PP depression s/s: No .  Any bowel or bladder issues: No  Pap smear: no abnormalities (date: 06-2018)  Review of Systems: Positive for nothing.  Her 12 point review of systems is negative or as noted in the History of Present Illness.  Patient Active Problem List   Diagnosis Date Noted  . Vitamin D deficiency 07/25/2018  . S/P cesarean section 07/21/2018  . Chronic hypertension during pregnancy, antepartum 07/21/2018  . Previous gastric bypass affecting pregnancy, antepartum 07/21/2018  . 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 AMA (advanced maternal age) primigravida 35+ 07/02/2018  . Eczema of both hands 08/16/2014  . Obesity 02/11/2012    Medications Charmon B. Barret had no medications administered during this visit. Current Outpatient Medications  Medication Sig Dispense Refill  . Prenatal Vit-Fe Fumarate-FA (PREPLUS) 27-1 MG TABS Take 1 tablet by mouth daily. 30 tablet 6  . acetaminophen (TYLENOL) 500 MG tablet Take 500 mg by mouth as needed for moderate pain.    Marland Kitchen  amLODipine (NORVASC) 10 MG tablet Take 1 tablet (10 mg total) by mouth daily. 30 tablet 0  . ferrous sulfate 325 (65 FE) MG tablet Take 1 tablet (325 mg total) by mouth 2 (two) times daily with a meal. 60 tablet 3  . oxyCODONE (OXY IR/ROXICODONE) 5 MG immediate release tablet Take 1-2 tablets (5-10 mg total) by mouth every 4 (four) hours as  needed for moderate pain. 30 tablet 0  . senna-docusate (SENOKOT-S) 8.6-50 MG tablet Take 2 tablets by mouth daily. 20 tablet 0   No current facility-administered medications for this visit.     Allergies Other and Penicillins  Physical Exam:  General:  Alert, oriented and cooperative.   Mental Status: Normal mood and affect perceived. Normal judgment and thought content.  Rest of physical exam deferred due to type of encounter  PP Depression Screening:   Edinburgh Postnatal Depression Scale - 03/06/19 1315      Edinburgh Postnatal Depression Scale:  In the Past 7 Days   I have been able to laugh and see the funny side of things.  0    I have looked forward with enjoyment to things.  0    I have blamed myself unnecessarily when things went wrong.  0    I have been anxious or worried for no good reason.  0    I have felt scared or panicky for no good reason.  0    Things have been getting on top of me.  0    I have been so unhappy that I have had difficulty sleeping.  0    I have felt sad or miserable.  0    I have been so unhappy that I have been crying.  0    The thought of harming myself has occurred to me.  0    Edinburgh Postnatal Depression Scale Total  0       Assessment:Patient is a 38 y.o. G4P0030 who is 5 weeks postpartum from a primary cesarean section.  She is doing well.   Plan: 1. Chronic hypertension during pregnancy, antepartum Patient needs appt for BP check her at clinic to determine if she needs to take Norvasc or not. Patient would prefer nurse check her BP than go to pharmacy.  Patient advised that she could be pregnant as she is not using a method and is bottle feeding; I recommended that she abstain or start [facility]; she does not want anything for [facility] but understands importance of spacing our her pregnancies, especially given pLTCS.   RTC for BP check   I discussed the assessment and treatment plan with the patient. The patient was provided an opportunity to  ask questions and all were answered. The patient agreed with the plan and demonstrated an understanding of the instructions.   The patient was advised to call back or seek an in-person evaluation/go to the ED for any concerning postpartum symptoms.  I provided 15 minutes of non-face-to-face time during this encounter.  Time spent on virtual visit: 15  Minutes  Vanessa Braun, CNM [facility], [facility]

## 2019-03-08 ENCOUNTER — Other Ambulatory Visit: Payer: Self-pay

## 2019-03-08 ENCOUNTER — Ambulatory Visit (INDEPENDENT_AMBULATORY_CARE_PROVIDER_SITE_OTHER): Payer: Medicaid Other | Admitting: *Deleted

## 2019-03-08 ENCOUNTER — Ambulatory Visit: Payer: BLUE CROSS/BLUE SHIELD | Admitting: Obstetrics & Gynecology

## 2019-03-08 VITALS — BP 112/70 | HR 57 | Temp 100.0°F | Ht 69.0 in | Wt 235.7 lb

## 2019-03-08 DIAGNOSIS — Z0130 Encounter for examination of blood pressure without abnormal findings: Secondary | ICD-10-CM

## 2019-03-08 NOTE — Progress Notes (Signed)
Pt presents for BP check.  Pt states the last time she took her amlodipine was over 1 week ago.  Pt has not been checking her BP at home d/t lack of a cuff.  Pt denies headaches, blurred vision, spots in her vision. BP manually today was 112/70.  Reviewed with Dr. Jolayne Panther, who advised that pt is ok to stay off of the amlodipine.  Pt verbalized understanding.

## 2019-03-09 NOTE — Progress Notes (Signed)
I have reviewed the chart and agree with nursing staff's documentation of this patient's encounter.  Catalina Antigua, MD 03/09/2019 11:51 AM

## 2019-03-28 ENCOUNTER — Telehealth: Payer: Self-pay | Admitting: Nurse Practitioner

## 2019-03-28 NOTE — Telephone Encounter (Signed)
The patient called in regarding the FMLA documents. Informed of the 15.00 charge. She stated she was unaware and requested a bill be made to her. Requested the forms be faxed to her employer. She stated she cant received FMLA pay until the documents are sent in and she does not have the funds to pay the FMLA documents at this time.

## 2019-03-30 ENCOUNTER — Telehealth: Payer: Self-pay | Admitting: Obstetrics & Gynecology

## 2019-03-30 NOTE — Telephone Encounter (Signed)
Patient came in to pick up FMLA documents. Stated she can pay for them once she starts to receive the FMLA as of right now she doesn't have the money to pay.

## 2019-04-13 ENCOUNTER — Telehealth: Payer: Self-pay | Admitting: Family Medicine

## 2019-04-13 NOTE — Telephone Encounter (Signed)
Spoke with patient about her FMLA paperwork for better clarification on why we needed to complete them again because the paperwork was filled out correctly. Patient stated that she needed she FMLA start time to say 01/21/2019. Explained to patient because the provider did not take her out of work that I would not be able to date her paperwork with that date. I could only start her leave the day that she delivered (01/30/19). Patient verbalized understanding and had no further questions.

## 2019-04-23 DIAGNOSIS — Z029 Encounter for administrative examinations, unspecified: Secondary | ICD-10-CM

## 2019-05-10 ENCOUNTER — Ambulatory Visit (INDEPENDENT_AMBULATORY_CARE_PROVIDER_SITE_OTHER): Payer: Medicaid Other

## 2019-05-10 ENCOUNTER — Other Ambulatory Visit: Payer: Self-pay

## 2019-05-10 ENCOUNTER — Encounter: Payer: Self-pay | Admitting: Family Medicine

## 2019-05-10 DIAGNOSIS — Z3201 Encounter for pregnancy test, result positive: Secondary | ICD-10-CM | POA: Diagnosis present

## 2019-05-10 LAB — POCT PREGNANCY, URINE
Preg Test, Ur: POSITIVE — AB
Preg Test, Ur: POSITIVE — AB

## 2019-05-10 NOTE — Progress Notes (Signed)
Pt here today for pregnancy test.  Resulted positive. Pt denies any pain or bleeding.  Pt reports LMP 03/14/19 EDD 12/19/19  8w 1d today.  Medications reconciled.  List of medications safe to take in pregnancy given.  [place] office to provide proof of pregnancy letter.    Mel Almond, RN 05/10/19

## 2019-05-17 NOTE — Progress Notes (Signed)
Patient ID: Vanessa Braun, female   DOB: 1981/01/21, 38 y.o.   MRN: [id number] Patient seen and assessed by nursing staff during this encounter. I have reviewed the chart and agree with the documentation and plan.  Emeterio Reeve, MD 05/17/2019 11:18 AM

## 2019-05-24 ENCOUNTER — Telehealth (INDEPENDENT_AMBULATORY_CARE_PROVIDER_SITE_OTHER): Payer: Medicaid Other

## 2019-05-24 ENCOUNTER — Other Ambulatory Visit: Payer: Self-pay

## 2019-05-24 DIAGNOSIS — Z0130 Encounter for examination of blood pressure without abnormal findings: Secondary | ICD-10-CM

## 2019-05-24 DIAGNOSIS — O0990 Supervision of high risk pregnancy, unspecified, unspecified trimester: Secondary | ICD-10-CM | POA: Insufficient documentation

## 2019-05-24 MED ORDER — BLOOD PRESSURE KIT DEVI: 1.0000 | 0 refills | Status: AC

## 2019-05-24 NOTE — Progress Notes (Signed)
I connected with  Tamesha Ellerbrock Shimel on 05/24/19 at 10:30 AM EDT by telephone and verified that I am speaking with the correct person using two identifiers.   I discussed the limitations, risks, security and privacy concerns of performing an evaluation and management service by telephone and the availability of in person appointments. I also discussed with the patient that there may be a patient responsible charge related to this service. The patient expressed understanding and agreed to proceed.  Pt hx obtained.  Pt informed that we will have [facility] contact her about receiving a BP cuff.  I also explained to the pt to bring the cuff to her appt so that we can make sure that she can use it properly.  Pt was unable to download [employer] app due to her phone getting good reception.  I explained to the pt to continue to watch out the email from [employer] and follow prompts and we follow up with her at her appt on 06/07/19.    I informed pt that we will need to do GC/Ch with no pap smear, OB urine cx, HgBA1C, OB panel, and offer her genetic screening at her face to face provider appt.  Pt verbalized understanding with no further questions.   Verdell Carmine, RN 05/24/2019  10:50 AM

## 2019-05-24 NOTE — Progress Notes (Signed)
Patient seen and assessed by nursing staff.  Agree with documentation and plan.  

## 2019-06-06 ENCOUNTER — Telehealth: Payer: Self-pay | Admitting: Family Medicine

## 2019-06-06 NOTE — Telephone Encounter (Signed)
Called and spoke to patient about appointment on 06-07-2019 she is aware to wear a face mask and no visitors are allowed due to [place]

## 2019-06-07 ENCOUNTER — Ambulatory Visit (INDEPENDENT_AMBULATORY_CARE_PROVIDER_SITE_OTHER): Payer: Medicaid Other | Admitting: Family Medicine

## 2019-06-07 ENCOUNTER — Other Ambulatory Visit: Payer: Self-pay

## 2019-06-07 ENCOUNTER — Encounter: Payer: Self-pay | Admitting: Family Medicine

## 2019-06-07 VITALS — BP 119/78 | HR 68 | Temp 97.8°F | Wt 258.4 lb

## 2019-06-07 DIAGNOSIS — O10911 Unspecified pre-existing hypertension complicating pregnancy, first trimester: Secondary | ICD-10-CM

## 2019-06-07 DIAGNOSIS — N96 Recurrent pregnancy loss: Secondary | ICD-10-CM

## 2019-06-07 DIAGNOSIS — Z3A12 12 weeks gestation of pregnancy: Secondary | ICD-10-CM

## 2019-06-07 DIAGNOSIS — Z98891 History of uterine scar from previous surgery: Secondary | ICD-10-CM

## 2019-06-07 DIAGNOSIS — O0990 Supervision of high risk pregnancy, unspecified, unspecified trimester: Secondary | ICD-10-CM

## 2019-06-07 DIAGNOSIS — Z1509 Genetic susceptibility to other malignant neoplasm: Secondary | ICD-10-CM

## 2019-06-07 DIAGNOSIS — O99840 Bariatric surgery status complicating pregnancy, unspecified trimester: Secondary | ICD-10-CM

## 2019-06-07 DIAGNOSIS — Z148 Genetic carrier of other disease: Secondary | ICD-10-CM

## 2019-06-07 DIAGNOSIS — O99841 Bariatric surgery status complicating pregnancy, first trimester: Secondary | ICD-10-CM

## 2019-06-07 DIAGNOSIS — O0991 Supervision of high risk pregnancy, unspecified, first trimester: Secondary | ICD-10-CM

## 2019-06-07 DIAGNOSIS — O10919 Unspecified pre-existing hypertension complicating pregnancy, unspecified trimester: Secondary | ICD-10-CM

## 2019-06-07 DIAGNOSIS — O09521 Supervision of elderly multigravida, first trimester: Secondary | ICD-10-CM

## 2019-06-07 DIAGNOSIS — Z3490 Encounter for supervision of normal pregnancy, unspecified, unspecified trimester: Secondary | ICD-10-CM

## 2019-06-07 DIAGNOSIS — E559 Vitamin D deficiency, unspecified: Secondary | ICD-10-CM

## 2019-06-07 NOTE — Patient Instructions (Signed)
 First Trimester of Pregnancy The first trimester of pregnancy is from week 1 until the end of week 13 (months 1 through 3). A week after a sperm fertilizes an egg, the egg will implant on the wall of the uterus. This embryo will begin to develop into a baby. Genes from you and your partner will form the baby. The female genes will determine whether the baby will be a boy or a girl. At 6-8 weeks, the eyes and face will be formed, and the heartbeat can be seen on ultrasound. At the end of 12 weeks, all the baby's organs will be formed. Now that you are pregnant, you will want to do everything you can to have a healthy baby. Two of the most important things are to get good prenatal care and to follow your health care provider's instructions. Prenatal care is all the medical care you receive before the baby's birth. This care will help prevent, find, and treat any problems during the pregnancy and childbirth. Body changes during your first trimester Your body goes through many changes during pregnancy. The changes vary from woman to woman.  You may gain or lose a couple of pounds at first.  You may feel sick to your stomach (nauseous) and you may throw up (vomit). If the vomiting is uncontrollable, call your health care provider.  You may tire easily.  You may develop headaches that can be relieved by medicines. All medicines should be approved by your health care provider.  You may urinate more often. Painful urination may mean you have a bladder infection.  You may develop heartburn as a result of your pregnancy.  You may develop constipation because certain hormones are causing the muscles that push stool through your intestines to slow down.  You may develop hemorrhoids or swollen veins (varicose veins).  Your breasts may begin to grow larger and become tender. Your nipples may stick out more, and the tissue that surrounds them (areola) may become darker.  Your gums may bleed and may be  sensitive to brushing and flossing.  Dark spots or blotches (chloasma, mask of pregnancy) may develop on your face. This will likely fade after the baby is born.  Your menstrual periods will stop.  You may have a loss of appetite.  You may develop cravings for certain kinds of food.  You may have changes in your emotions from day to day, such as being excited to be pregnant or being concerned that something may go wrong with the pregnancy and baby.  You may have more vivid and strange dreams.  You may have changes in your hair. These can include thickening of your hair, rapid growth, and changes in texture. Some women also have hair loss during or after pregnancy, or hair that feels dry or thin. Your hair will most likely return to normal after your baby is born. What to expect at prenatal visits During a routine prenatal visit:  You will be weighed to make sure you and the baby are growing normally.  Your blood pressure will be taken.  Your abdomen will be measured to track your baby's growth.  The fetal heartbeat will be listened to between weeks 10 and 14 of your pregnancy.  Test results from any previous visits will be discussed. Your health care provider may ask you:  How you are feeling.  If you are feeling the baby move.  If you have had any abnormal symptoms, such as leaking fluid, bleeding, severe headaches, or   abdominal cramping.  If you are using any tobacco products, including cigarettes, chewing tobacco, and electronic cigarettes.  If you have any questions. Other tests that may be performed during your first trimester include:  Blood tests to find your blood type and to check for the presence of any previous infections. The tests will also be used to check for low iron levels (anemia) and protein on red blood cells (Rh antibodies). Depending on your risk factors, or if you previously had diabetes during pregnancy, you may have tests to check for high blood sugar  that affects pregnant women (gestational diabetes).  Urine tests to check for infections, diabetes, or protein in the urine.  An ultrasound to confirm the proper growth and development of the baby.  Fetal screens for spinal cord problems (spina bifida) and Down syndrome.  HIV (human immunodeficiency virus) testing. Routine prenatal testing includes screening for HIV, unless you choose not to have this test.  You may need other tests to make sure you and the baby are doing well. Follow these instructions at home: Medicines  Follow your health care provider's instructions regarding medicine use. Specific medicines may be either safe or unsafe to take during pregnancy.  Take a prenatal vitamin that contains at least 600 micrograms (mcg) of folic acid.  If you develop constipation, try taking a stool softener if your health care provider approves. Eating and drinking   Eat a balanced diet that includes fresh fruits and vegetables, whole grains, good sources of protein such as meat, eggs, or tofu, and low-fat dairy. Your health care provider will help you determine the amount of weight gain that is right for you.  Avoid raw meat and uncooked cheese. These carry germs that can cause birth defects in the baby.  Eating four or five small meals rather than three large meals a day may help relieve nausea and vomiting. If you start to feel nauseous, eating a few soda crackers can be helpful. Drinking liquids between meals, instead of during meals, also seems to help ease nausea and vomiting.  Limit foods that are high in fat and processed sugars, such as fried and sweet foods.  To prevent constipation: ? Eat foods that are high in fiber, such as fresh fruits and vegetables, whole grains, and beans. ? Drink enough fluid to keep your urine clear or pale yellow. Activity  Exercise only as directed by your health care provider. Most women can continue their usual exercise routine during  pregnancy. Try to exercise for 30 minutes at least 5 days a week. Exercising will help you: ? Control your weight. ? Stay in shape. ? Be prepared for labor and delivery.  Experiencing pain or cramping in the lower abdomen or lower back is a good sign that you should stop exercising. Check with your health care provider before continuing with normal exercises.  Try to avoid standing for long periods of time. Move your legs often if you must stand in one place for a long time.  Avoid heavy lifting.  Wear low-heeled shoes and practice good posture.  You may continue to have sex unless your health care provider tells you not to. Relieving pain and discomfort  Wear a good support bra to relieve breast tenderness.  Take warm sitz baths to soothe any pain or discomfort caused by hemorrhoids. Use hemorrhoid cream if your health care provider approves.  Rest with your legs elevated if you have leg cramps or low back pain.  If you develop varicose veins   in your legs, wear support hose. Elevate your feet for 15 minutes, 3-4 times a day. Limit salt in your diet. Prenatal care  Schedule your prenatal visits by the twelfth week of pregnancy. They are usually scheduled monthly at first, then more often in the last 2 months before delivery.  Write down your questions. Take them to your prenatal visits.  Keep all your prenatal visits as told by your health care provider. This is important. Safety  Wear your seat belt at all times when driving.  Make a list of emergency phone numbers, including numbers for family, friends, the hospital, and police and fire departments. General instructions  Ask your health care provider for a referral to a local prenatal education class. Begin classes no later than the beginning of month 6 of your pregnancy.  Ask for help if you have counseling or nutritional needs during pregnancy. Your health care provider can offer advice or refer you to specialists for help  with various needs.  Do not use hot tubs, steam rooms, or saunas.  Do not douche or use tampons or scented sanitary pads.  Do not cross your legs for long periods of time.  Avoid cat litter boxes and soil used by cats. These carry germs that can cause birth defects in the baby and possibly loss of the fetus by miscarriage or stillbirth.  Avoid all smoking, herbs, alcohol, and medicines not prescribed by your health care provider. Chemicals in these products affect the formation and growth of the baby.  Do not use any products that contain nicotine or tobacco, such as cigarettes and e-cigarettes. If you need help quitting, ask your health care provider. You may receive counseling support and other resources to help you quit.  Schedule a dentist appointment. At home, brush your teeth with a soft toothbrush and be gentle when you floss. Contact a health care provider if:  You have dizziness.  You have mild pelvic cramps, pelvic pressure, or nagging pain in the abdominal area.  You have persistent nausea, vomiting, or diarrhea.  You have a bad smelling vaginal discharge.  You have pain when you urinate.  You notice increased swelling in your face, hands, legs, or ankles.  You are exposed to fifth disease or chickenpox.  You are exposed to [place] measles (rubella) and have never had it. Get help right away if:  You have a fever.  You are leaking fluid from your vagina.  You have spotting or bleeding from your vagina.  You have severe abdominal cramping or pain.  You have rapid weight gain or loss.  You vomit blood or material that looks like coffee grounds.  You develop a severe headache.  You have shortness of breath.  You have any kind of trauma, such as from a fall or a car accident. Summary  The first trimester of pregnancy is from week 1 until the end of week 13 (months 1 through 3).  Your body goes through many changes during pregnancy. The changes vary from  woman to woman.  You will have routine prenatal visits. During those visits, your health care provider will examine you, discuss any test results you may have, and talk with you about how you are feeling. This information is not intended to replace advice given to you by your health care provider. Make sure you discuss any questions you have with your health care provider. Document Released: 10/05/2001 Document Revised: 09/23/2017 Document Reviewed: 09/22/2016 Elsevier Patient Education  2020 [employer].  Breastfeeding  Choosing to breastfeed is one of the best decisions you can make for yourself and your baby. A change in hormones during pregnancy causes your breasts to make breast milk in your milk-producing glands. Hormones prevent breast milk from being released before your baby is born. They also prompt milk flow after birth. Once breastfeeding has begun, thoughts of your baby, as well as his or her sucking or crying, can stimulate the release of milk from your milk-producing glands. Benefits of breastfeeding Research shows that breastfeeding offers many health benefits for infants and mothers. It also offers a cost-free and convenient way to feed your baby. For your baby  Your first milk (colostrum) helps your baby's digestive system to function better.  Special cells in your milk (antibodies) help your baby to fight off infections.  Breastfed babies are less likely to develop asthma, allergies, obesity, or type 2 diabetes. They are also at lower risk for sudden infant death syndrome (SIDS).  Nutrients in breast milk are better able to meet your baby's needs compared to infant formula.  Breast milk improves your baby's brain development. For you  Breastfeeding helps to create a very special bond between you and your baby.  Breastfeeding is convenient. Breast milk costs nothing and is always available at the correct temperature.  Breastfeeding helps to burn calories. It helps  you to lose the weight that you gained during pregnancy.  Breastfeeding makes your uterus return faster to its size before pregnancy. It also slows bleeding (lochia) after you give birth.  Breastfeeding helps to lower your risk of developing type 2 diabetes, osteoporosis, rheumatoid arthritis, cardiovascular disease, and breast, ovarian, uterine, and endometrial cancer later in life. Breastfeeding basics Starting breastfeeding  Find a comfortable place to sit or lie down, with your neck and back well-supported.  Place a pillow or a rolled-up blanket under your baby to bring him or her to the level of your breast (if you are seated). Nursing pillows are specially designed to help support your arms and your baby while you breastfeed.  Make sure that your baby's tummy (abdomen) is facing your abdomen.  Gently massage your breast. With your fingertips, massage from the outer edges of your breast inward toward the nipple. This encourages milk flow. If your milk flows slowly, you may need to continue this action during the feeding.  Support your breast with 4 fingers underneath and your thumb above your nipple (make the letter "C" with your hand). Make sure your fingers are well away from your nipple and your baby's mouth.  Stroke your baby's lips gently with your finger or nipple.  When your baby's mouth is open wide enough, quickly bring your baby to your breast, placing your entire nipple and as much of the areola as possible into your baby's mouth. The areola is the colored area around your nipple. ? More areola should be visible above your baby's upper lip than below the lower lip. ? Your baby's lips should be opened and extended outward (flanged) to ensure an adequate, comfortable latch. ? Your baby's tongue should be between his or her lower gum and your breast.  Make sure that your baby's mouth is correctly positioned around your nipple (latched). Your baby's lips should create a seal on  your breast and be turned out (everted).  It is common for your baby to suck about 2-3 minutes in order to start the flow of breast milk. Latching Teaching your baby how to latch onto your breast properly  is very important. An improper latch can cause nipple pain, decreased milk supply, and poor weight gain in your baby. Also, if your baby is not latched onto your nipple properly, he or she may swallow some air during feeding. This can make your baby fussy. Burping your baby when you switch breasts during the feeding can help to get rid of the air. However, teaching your baby to latch on properly is still the best way to prevent fussiness from swallowing air while breastfeeding. Signs that your baby has successfully latched onto your nipple  Silent tugging or silent sucking, without causing you pain. Infant's lips should be extended outward (flanged).  Swallowing heard between every 3-4 sucks once your milk has started to flow (after your let-down milk reflex occurs).  Muscle movement above and in front of his or her ears while sucking. Signs that your baby has not successfully latched onto your nipple  Sucking sounds or smacking sounds from your baby while breastfeeding.  Nipple pain. If you think your baby has not latched on correctly, slip your finger into the corner of your baby's mouth to break the suction and place it between your baby's gums. Attempt to start breastfeeding again. Signs of successful breastfeeding Signs from your baby  Your baby will gradually decrease the number of sucks or will completely stop sucking.  Your baby will fall asleep.  Your baby's body will relax.  Your baby will retain a small amount of milk in his or her mouth.  Your baby will let go of your breast by himself or herself. Signs from you  Breasts that have increased in firmness, weight, and size 1-3 hours after feeding.  Breasts that are softer immediately after breastfeeding.  Increased milk  volume, as well as a change in milk consistency and color by the fifth day of breastfeeding.  Nipples that are not sore, cracked, or bleeding. Signs that your baby is getting enough milk  Wetting at least 1-2 diapers during the first 24 hours after birth.  Wetting at least 5-6 diapers every 24 hours for the first week after birth. The urine should be clear or pale yellow by the age of 5 days.  Wetting 6-8 diapers every 24 hours as your baby continues to grow and develop.  At least 3 stools in a 24-hour period by the age of 5 days. The stool should be soft and yellow.  At least 3 stools in a 24-hour period by the age of 7 days. The stool should be seedy and yellow.  No loss of weight greater than 10% of birth weight during the first 3 days of life.  Average weight gain of 4-7 oz (113-198 g) per week after the age of 4 days.  Consistent daily weight gain by the age of 5 days, without weight loss after the age of 2 weeks. After a feeding, your baby may spit up a small amount of milk. This is normal. Breastfeeding frequency and duration Frequent feeding will help you make more milk and can prevent sore nipples and extremely full breasts (breast engorgement). Breastfeed when you feel the need to reduce the fullness of your breasts or when your baby shows signs of hunger. This is called "breastfeeding on demand." Signs that your baby is hungry include:  Increased alertness, activity, or restlessness.  Movement of the head from side to side.  Opening of the mouth when the corner of the mouth or cheek is stroked (rooting).  Increased sucking sounds, smacking lips, cooing,  sighing, or squeaking.  Hand-to-mouth movements and sucking on fingers or hands.  Fussing or crying. Avoid introducing a pacifier to your baby in the first 4-6 weeks after your baby is born. After this time, you may choose to use a pacifier. Research has shown that pacifier use during the first year of a baby's life  decreases the risk of sudden infant death syndrome (SIDS). Allow your baby to feed on each breast as long as he or she wants. When your baby unlatches or falls asleep while feeding from the first breast, offer the second breast. Because newborns are often sleepy in the first few weeks of life, you may need to awaken your baby to get him or her to feed. Breastfeeding times will vary from baby to baby. However, the following rules can serve as a guide to help you make sure that your baby is properly fed:  Newborns (babies 28 weeks of age or younger) may breastfeed every 1-3 hours.  Newborns should not go without breastfeeding for longer than 3 hours during the day or 5 hours during the night.  You should breastfeed your baby a minimum of 8 times in a 24-hour period. Breast milk pumping     Pumping and storing breast milk allows you to make sure that your baby is exclusively fed your breast milk, even at times when you are unable to breastfeed. This is especially important if you go back to work while you are still breastfeeding, or if you are not able to be present during feedings. Your lactation consultant can help you find a method of pumping that works best for you and give you guidelines about how long it is safe to store breast milk. Caring for your breasts while you breastfeed Nipples can become dry, cracked, and sore while breastfeeding. The following recommendations can help keep your breasts moisturized and healthy:  Avoid using soap on your nipples.  Wear a supportive bra designed especially for nursing. Avoid wearing underwire-style bras or extremely tight bras (sports bras).  Air-dry your nipples for 3-4 minutes after each feeding.  Use only cotton bra pads to absorb leaked breast milk. Leaking of breast milk between feedings is normal.  Use lanolin on your nipples after breastfeeding. Lanolin helps to maintain your skin's normal moisture barrier. Pure lanolin is not harmful (not  toxic) to your baby. You may also hand express a few drops of breast milk and gently massage that milk into your nipples and allow the milk to air-dry. In the first few weeks after giving birth, some women experience breast engorgement. Engorgement can make your breasts feel heavy, warm, and tender to the touch. Engorgement peaks within 3-5 days after you give birth. The following recommendations can help to ease engorgement:  Completely empty your breasts while breastfeeding or pumping. You may want to start by applying warm, moist heat (in the shower or with warm, water-soaked hand towels) just before feeding or pumping. This increases circulation and helps the milk flow. If your baby does not completely empty your breasts while breastfeeding, pump any extra milk after he or she is finished.  Apply ice packs to your breasts immediately after breastfeeding or pumping, unless this is too uncomfortable for you. To do this: ? Put ice in a plastic bag. ? Place a towel between your skin and the bag. ? Leave the ice on for 20 minutes, 2-3 times a day.  Make sure that your baby is latched on and positioned properly while breastfeeding. If  engorgement persists after 48 hours of following these recommendations, contact your health care provider or a Advertising copywriterlactation consultant. Overall health care recommendations while breastfeeding  Eat 3 healthy meals and 3 snacks every day. Well-nourished mothers who are breastfeeding need an additional 450-500 calories a day. You can meet this requirement by increasing the amount of a balanced diet that you eat.  Drink enough water to keep your urine pale yellow or clear.  Rest often, relax, and continue to take your prenatal vitamins to prevent fatigue, stress, and low vitamin and mineral levels in your body (nutrient deficiencies).  Do not use any products that contain nicotine or tobacco, such as cigarettes and e-cigarettes. Your baby may be harmed by chemicals from  cigarettes that pass into breast milk and exposure to secondhand smoke. If you need help quitting, ask your health care provider.  Avoid alcohol.  Do not use illegal drugs or marijuana.  Talk with your health care provider before taking any medicines. These include over-the-counter and prescription medicines as well as vitamins and herbal supplements. Some medicines that may be harmful to your baby can pass through breast milk.  It is possible to become pregnant while breastfeeding. If birth control is desired, ask your health care provider about options that will be safe while breastfeeding your baby. Where to find more information: [employer]: www.llli.org Contact a health care provider if:  You feel like you want to stop breastfeeding or have become frustrated with breastfeeding.  Your nipples are cracked or bleeding.  Your breasts are red, tender, or warm.  You have: ? Painful breasts or nipples. ? A swollen area on either breast. ? A fever or chills. ? Nausea or vomiting. ? Drainage other than breast milk from your nipples.  Your breasts do not become full before feedings by the fifth day after you give birth.  You feel sad and depressed.  Your baby is: ? Too sleepy to eat well. ? Having trouble sleeping. ? More than 641 week old and wetting fewer than 6 diapers in a 24-hour period. ? Not gaining weight by 665 days of age.  Your baby has fewer than 3 stools in a 24-hour period.  Your baby's skin or the white parts of his or her eyes become yellow. Get help right away if:  Your baby is overly tired (lethargic) and does not want to wake up and feed.  Your baby develops an unexplained fever. Summary  Breastfeeding offers many health benefits for infant and mothers.  Try to breastfeed your infant when he or she shows early signs of hunger.  Gently tickle or stroke your baby's lips with your finger or nipple to allow the baby to open his or her mouth.  Bring the baby to your breast. Make sure that much of the areola is in your baby's mouth. Offer one side and burp the baby before you offer the other side.  Talk with your health care provider or lactation consultant if you have questions or you face problems as you breastfeed. This information is not intended to replace advice given to you by your health care provider. Make sure you discuss any questions you have with your health care provider. Document Released: 10/11/2005 Document Revised: 01/05/2018 Document Reviewed: 11/12/2016 Elsevier Patient Education  2020 [employer]

## 2019-06-07 NOTE — Progress Notes (Addendum)
Subjective:   Vanessa Braun is a 38 y.o. [id number] at 50w1dby uncertain LMP being seen today for her first obstetrical visit.  Her obstetrical history is significant for advanced maternal age, obesity and cHTN, prior cesarean. Patient does not intend to breast feed. Pregnancy history fully reviewed.  Patient reports no complaints.  HISTORY: OB History  Gravida Para Term Preterm AB Living  _0 0 3 1  SAB TAB Ectopic Multiple Live Births  3 0 0 0 1    # Outcome Date GA Lbr Len/2nd Weight Sex Delivery Anes PTL Lv  5 Current           4 Term 01/30/19 385w2d5 lb 6 oz (2.438 kg) F CS-LTranv EPI N LIV     Complications: Fetal heart rate decelerations, delivered  3 SAB 2019 5w72w0d      2 SAB 2013 4w032w0d     1 SAB 2012 77w0d47w0d     Last pap smear was  07/21/2018 and was normal Past Medical History:  Diagnosis Date  . Abnormal Pap smear   . Chronic dermatitis of hands   . Complication of anesthesia    trouble waking up  . Eczema   . GERD (gastroesophageal reflux disease)    lost weight with bypass surgery, no problems since  . Hypertension   . PCOS (polycystic ovarian syndrome)    Past Surgical History:  Procedure Laterality Date  . ADENOIDECTOMY    . CESAREAN SECTION N/A 01/30/2019   Procedure: CESAREAN SECTION;  Surgeon: NewtoCaren Macadam  Location: [facility];  Service: Obstetrics;  Laterality: N/A;  . DILATION AND CURETTAGE OF UTERUS    . DILATION AND EVACUATION N/A 04/01/2014   Procedure: DILATATION AND EVACUATION;  Surgeon: VaishElveria Royals  [facility]  Service: Gynecology;  Laterality: N/A;  . DILATION AND EVACUATION N/A 12/23/2017   Procedure: DILATATION AND EVACUATION;  Surgeon: HarraLavonia Drafts  [facility]  Service: Gynecology;  Laterality: N/A;  . GASTRIC BYPASS    . TONSILLECTOMY AND ADENOIDECTOMY  1999   Family History  Problem Relation Age of Onset  . Diabetes Mother   . Hyperlipidemia Mother   . Hypertension  Mother   . Diabetes Father   . Kidney disease Father   . Hypertension Father   . Hyperlipidemia Father   . Cancer Father        pancreatic  . Gestational diabetes Sister   . Depression Sister   . Diabetes Sister   . Heart disease Maternal Grandmother   . Heart disease Paternal Grandmother    Social History   Tobacco Use  . Smoking status: Never Smoker  . Smokeless tobacco: Never Used  Substance Use Topics  . Alcohol use: Not Currently    Frequency: Never  . Drug use: No   Allergies  Allergen Reactions  . Aspirin     Contraindicated due to gastric bypass  . Other     "anything antibacterial"; some creams for dermatitis  . Penicillins Itching and Rash   Current Outpatient Medications on File Prior to Visit  Medication Sig Dispense Refill  . Blood Pressure Monitoring (BLOOD PRESSURE KIT) DEVI 1 Device by Does not apply route See admin instructions. Blood pressure cuff 1 Device 0  . Prenatal Vit-Fe Fumarate-FA (PREPLUS) 27-1 MG TABS Take 1 tablet by mouth daily. 30 tablet 6   No  current facility-administered medications on file prior to visit.      Exam   Vitals:   06/07/19 1343  BP: 119/78  Pulse: 68  Temp: 97.8 F (36.6 C)  Weight: 258 lb 6.4 oz (117.2 kg)   Fetal Heart Rate (bpm): u/s  System: General: well-developed, well-nourished female in no acute distress   Skin: normal coloration and turgor, no rashes   Neurologic: oriented, normal, negative, normal mood   Extremities: normal strength, tone, and muscle mass, ROM of all joints is normal   HEENT PERRLA, extraocular movement intact and sclera clear, anicteric   Mouth/Teeth mucous membranes moist, pharynx normal without lesions and dental hygiene good   Neck supple and no masses   Cardiovascular: regular rate and rhythm   Respiratory:  no respiratory distress, normal breath sounds   Abdomen: soft, non-tender; bowel sounds normal; no masses,  no organomegaly     Assessment:   Pregnancy: [id number]  Patient Active Problem List   Diagnosis Date Noted  . AMA (advanced maternal age) multigravida 48+, first trimester 06/07/2019  . Supervision of high risk pregnancy, antepartum 05/24/2019  . Vitamin D deficiency 07/25/2018  . S/P cesarean section 07/21/2018  . Chronic hypertension during pregnancy, antepartum 07/21/2018  . Previous gastric bypass affecting pregnancy, antepartum 07/21/2018  . 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 Eczema of both hands 08/16/2014  . Obesity 02/11/2012     Plan:  1. Supervision of high risk pregnancy, antepartum OB intake labs Irregular period and short interval pregnancy. Unable to doppler but IUP w cardiac activity seen on BSUS. Will send for dating ultrasound. - dating [place] - Hemoglobin A1c - Culture, OB Urine - Comprehensive metabolic panel - Protein / creatinine ratio, urine - TSH - VITAMIN D 25 Hydroxy (Vit-D Deficiency, Fractures) - B12 and Folate Panel - Obstetric Panel, Including HIV - Babyscripts Schedule Optimization  2. AMA (advanced maternal age) multigravida 61+, first trimester NIPS once dating has been clarified  3. S/P cesarean section Indication NRFHT TOLAC counseling at future visit  4. Previous gastric bypass affecting pregnancy, antepartum Check labs No ASA - B12 - folate - ferritin - Vit D - A1c  5. History of multiple miscarriages 3 SAB followed by term pregnancy  6. Vitamin D deficiency Check level today  7. Chronic hypertension during pregnancy, antepartum Diagnosed prior to last pregnancy, not on meds Baseline labs today BP cuff ordered  8. Pregnancy with fetus of unknown gestational age Uncertain dating - US OB Comp Less 14 Wks; Future   Initial labs drawn. Continue prenatal vitamins. Genetic Screening discussed, defer until dating is confirmed: n/a. Ultrasound discussed; fetal anatomic survey: ordered. Problem list reviewed and updated. The nature of [place] with multiple MDs and other Advanced Practice Providers was explained to patient; also emphasized that residents, students are part of our team. Routine obstetric precautions reviewed. Return in 4 weeks (on 07/05/2019) for in person.

## 2019-06-08 LAB — OBSTETRIC PANEL, INCLUDING HIV
Antibody Screen: NEGATIVE
Basophils Absolute: 0 10*3/uL (ref 0.0–0.2)
Basos: 1 %
EOS (ABSOLUTE): 0.1 10*3/uL (ref 0.0–0.4)
Eos: 2 %
HIV Screen 4th Generation wRfx: NONREACTIVE
Hematocrit: 34.6 % (ref 34.0–46.6)
Hemoglobin: 11.4 g/dL (ref 11.1–15.9)
Hepatitis B Surface Ag: NEGATIVE
Immature Grans (Abs): 0 10*3/uL (ref 0.0–0.1)
Immature Granulocytes: 0 %
Lymphocytes Absolute: 2.7 10*3/uL (ref 0.7–3.1)
Lymphs: 39 %
MCH: 28.9 pg (ref 26.6–33.0)
MCHC: 32.9 g/dL (ref 31.5–35.7)
MCV: 88 fL (ref 79–97)
Monocytes Absolute: 0.6 10*3/uL (ref 0.1–0.9)
Monocytes: 9 %
Neutrophils Absolute: 3.4 10*3/uL (ref 1.4–7.0)
Neutrophils: 49 %
Platelets: 163 10*3/uL (ref 150–450)
RBC: 3.95 x10E6/uL (ref 3.77–5.28)
RDW: 13.1 % (ref 11.7–15.4)
RPR Ser Ql: NONREACTIVE
Rh Factor: POSITIVE
Rubella Antibodies, IGG: 1.91 index (ref >0.99)
WBC: 6.9 10*3/uL (ref 3.4–10.8)

## 2019-06-08 LAB — B12 AND FOLATE PANEL
Folate: >20 ng/mL (ref >3.0)
Vitamin B-12: 457 pg/mL (ref 232–1245)

## 2019-06-08 LAB — COMPREHENSIVE METABOLIC PANEL
ALT: 18 IU/L (ref 0–32)
AST: 22 IU/L (ref 0–40)
Albumin/Globulin Ratio: 1.5 (ref 1.2–2.2)
Albumin: 3.6 g/dL — ABNORMAL LOW (ref 3.8–4.8)
Alkaline Phosphatase: 64 IU/L (ref 39–117)
BUN/Creatinine Ratio: 8 — ABNORMAL LOW (ref 9–23)
BUN: 6 mg/dL (ref 6–20)
Bilirubin Total: 0.2 mg/dL (ref 0.0–1.2)
CO2: 20 mmol/L (ref 20–29)
Calcium: 8.5 mg/dL — ABNORMAL LOW (ref 8.7–10.2)
Chloride: 103 mmol/L (ref 96–106)
Creatinine, Ser: 0.75 mg/dL (ref 0.57–1.00)
GFR calc Af Amer: 118 mL/min/{1.73_m2} (ref >59)
GFR calc non Af Amer: 102 mL/min/{1.73_m2} (ref >59)
Globulin, Total: 2.4 g/dL (ref 1.5–4.5)
Glucose: 79 mg/dL (ref 65–99)
Potassium: 4.3 mmol/L (ref 3.5–5.2)
Sodium: 137 mmol/L (ref 134–144)
Total Protein: 6 g/dL (ref 6.0–8.5)

## 2019-06-08 LAB — TSH: TSH: 1.44 u[IU]/mL (ref 0.450–4.500)

## 2019-06-08 LAB — PROTEIN / CREATININE RATIO, URINE
Creatinine, Urine: 218.6 mg/dL
Protein, Ur: 11.1 mg/dL
Protein/Creat Ratio: 51 mg/g creat (ref 0–200)

## 2019-06-08 LAB — HEMOGLOBIN A1C
Est. average glucose Bld gHb Est-mCnc: 111 mg/dL
Hgb A1c MFr Bld: 5.5 % (ref 4.8–5.6)

## 2019-06-08 LAB — VITAMIN D 25 HYDROXY (VIT D DEFICIENCY, FRACTURES): Vit D, 25-Hydroxy: 13.8 ng/mL — ABNORMAL LOW (ref 30.0–100.0)

## 2019-06-10 LAB — CULTURE, OB URINE

## 2019-06-10 LAB — URINE CULTURE, OB REFLEX

## 2019-06-11 ENCOUNTER — Telehealth: Payer: Self-pay | Admitting: *Deleted

## 2019-06-11 DIAGNOSIS — Z148 Genetic carrier of other disease: Secondary | ICD-10-CM | POA: Insufficient documentation

## 2019-06-11 MED ORDER — CALCIUM CARBONATE-VITAMIN D 500-200 MG-UNIT PO TABS: 1.0000 | ORAL_TABLET | Freq: Two times a day (BID) | ORAL | 2 refills | Status: DC

## 2019-06-11 MED ORDER — VITAMIN D (ERGOCALCIFEROL) 1.25 MG (50000 UNIT) PO CAPS: 50000.0000 [IU] | ORAL_CAPSULE | ORAL | 0 refills | Status: DC

## 2019-06-11 NOTE — Addendum Note (Signed)
Addended by: Donnamae Jude on: 06/11/2019 08:35 AM   Modules accepted: Orders

## 2019-06-11 NOTE — Telephone Encounter (Signed)
Received a voicemail from this am stating " can you call me in Calcitrol instead of calcium"

## 2019-06-13 NOTE — Telephone Encounter (Addendum)
I called Vanessa Braun and informed her Calcitrol is not recommended in pregnancy.  She states she has a history of gastric bypass and took some vitamins, etc in her other pregnancy. She states she doesn't want to take something that her body can't absorb anyways. I informed her that our providers would review her history and what she should take during her pregnancy and that yes there are some supplements she should take. I reviewed her upcoming appointment with her.  She voices understanding.

## 2019-06-18 ENCOUNTER — Ambulatory Visit (HOSPITAL_COMMUNITY)
Admission: RE | Admit: 2019-06-18 | Discharge: 2019-06-18 | Disposition: A | Discharge status: Home / Self Care | Payer: Medicaid Other | Source: Ambulatory Visit | Attending: Family Medicine | Admitting: Family Medicine

## 2019-06-18 ENCOUNTER — Ambulatory Visit: Payer: Medicaid Other | Admitting: Lactation Services

## 2019-06-18 ENCOUNTER — Other Ambulatory Visit: Payer: Self-pay

## 2019-06-18 ENCOUNTER — Telehealth: Payer: Self-pay | Admitting: Family Medicine

## 2019-06-18 VITALS — Wt 256.4 lb

## 2019-06-18 DIAGNOSIS — Z3490 Encounter for supervision of normal pregnancy, unspecified, unspecified trimester: Secondary | ICD-10-CM | POA: Insufficient documentation

## 2019-06-18 DIAGNOSIS — O0990 Supervision of high risk pregnancy, unspecified, unspecified trimester: Secondary | ICD-10-CM

## 2019-06-18 NOTE — Telephone Encounter (Signed)
Spoke to patient about her appointment on 8/24 @ 3:50. Patient instructed to wear a face mask for the entire appointment and no visitors are allowed with her during the visit. Patient screened for covid symptoms and denied having any

## 2019-06-18 NOTE — Progress Notes (Signed)
Pt in for Vanessa Braun, Vanessa Braun results given. Pt was informed of new EDD of 01/03/2020.   Pt requested to have her Genetic Screening drawn today so was drawn. Pt given purple card to look up results.   Pt with no questions/concerns at this time.

## 2019-06-19 ENCOUNTER — Encounter: Payer: Self-pay | Admitting: General Practice

## 2019-07-03 ENCOUNTER — Encounter: Payer: Self-pay | Admitting: *Deleted

## 2019-07-05 ENCOUNTER — Ambulatory Visit (INDEPENDENT_AMBULATORY_CARE_PROVIDER_SITE_OTHER): Payer: Medicaid Other | Admitting: Obstetrics and Gynecology

## 2019-07-05 ENCOUNTER — Other Ambulatory Visit: Payer: Self-pay

## 2019-07-05 VITALS — BP 128/79 | HR 65 | Wt 259.0 lb

## 2019-07-05 DIAGNOSIS — O09521 Supervision of elderly multigravida, first trimester: Secondary | ICD-10-CM

## 2019-07-05 DIAGNOSIS — O09522 Supervision of elderly multigravida, second trimester: Secondary | ICD-10-CM

## 2019-07-05 DIAGNOSIS — O0990 Supervision of high risk pregnancy, unspecified, unspecified trimester: Secondary | ICD-10-CM

## 2019-07-05 DIAGNOSIS — Z3A14 14 weeks gestation of pregnancy: Secondary | ICD-10-CM

## 2019-07-05 DIAGNOSIS — O99842 Bariatric surgery status complicating pregnancy, second trimester: Secondary | ICD-10-CM

## 2019-07-05 DIAGNOSIS — O99840 Bariatric surgery status complicating pregnancy, unspecified trimester: Secondary | ICD-10-CM

## 2019-07-05 DIAGNOSIS — O34219 Maternal care for unspecified type scar from previous cesarean delivery: Secondary | ICD-10-CM

## 2019-07-05 DIAGNOSIS — Z98891 History of uterine scar from previous surgery: Secondary | ICD-10-CM

## 2019-07-05 DIAGNOSIS — O10919 Unspecified pre-existing hypertension complicating pregnancy, unspecified trimester: Secondary | ICD-10-CM

## 2019-07-05 DIAGNOSIS — O0992 Supervision of high risk pregnancy, unspecified, second trimester: Secondary | ICD-10-CM

## 2019-07-05 DIAGNOSIS — O10912 Unspecified pre-existing hypertension complicating pregnancy, second trimester: Secondary | ICD-10-CM

## 2019-07-05 MED ORDER — FOLIC ACID 1 MG PO TABS: 1.0000 mg | ORAL_TABLET | Freq: Every day | ORAL | 3 refills | Status: DC

## 2019-07-05 NOTE — Progress Notes (Signed)
error 

## 2019-07-06 ENCOUNTER — Encounter: Payer: Self-pay | Admitting: *Deleted

## 2019-07-06 NOTE — Progress Notes (Signed)
Prenatal Visit Note Date: 07/05/2019 Clinic: [facility]  Subjective:  Vanessa Braun is a 38 y.o. Q7Y1950 at [redacted]w[redacted]d being seen today for ongoing prenatal care.  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  Patient reports no complaints.   Contractions: Not present. Vag. Bleeding: None.   . Denies leaking of fluid.   The following portions of the patient's history were reviewed and updated as appropriate: allergies, current medications, past family history, past medical history, past social history, past surgical history and problem list. Problem list updated.  Objective:   Vitals:   07/05/19 1359  BP: 128/79  Pulse: 65  Weight: 259 lb (117.5 kg)    Fetal Status: Fetal Heart Rate (bpm): 154         General:  Alert, oriented and cooperative. Patient is in no acute distress.  Skin: Skin is warm and dry. No rash noted.   Cardiovascular: Normal heart rate noted  Respiratory: Normal respiratory effort, no problems with respiration noted  Abdomen: Soft, gravid, appropriate for gestational age. Pain/Pressure: Absent     Pelvic:  Cervical exam deferred        Extremities: Normal range of motion.  Edema: None  Mental Status: Normal mood and affect. Normal behavior. Normal judgment and thought content.   Urinalysis:      Assessment and Plan:  Pregnancy: [id number] at [redacted]w[redacted]d  1. Chronic hypertension during pregnancy, antepartum Routine care Doing well on no meds Hold off on asa due to gastric bypass roux en y procedure  2.  History of cesarean delivery D/w her re: delivery mode later in pregnancy  3. Previous gastric bypass affecting pregnancy, antepartum Baseline b12, folate normal qmonth serial growth u/s starting after anatomy u/s  4. Supervision of high risk pregnancy, antepartum Offer afp nv Will set up for anatomy u/s - [place] MFM OB DETAIL +14 WK; Future  5. AMA (advanced maternal age) multigravida 72+, first trimester cffdna low risk Follow up anatomy u/s - [place] MFM OB DETAIL +14 WK; Future  Preterm labor symptoms and general obstetric precautions including but not limited to vaginal bleeding, contractions, leaking of fluid and fetal movement were reviewed in detail with the patient. Please refer to After Visit Summary for other counseling recommendations.  Return in about 5 weeks (around 08/09/2019) for high risk, in person.   Aletha Halim, MD

## 2019-07-25 ENCOUNTER — Other Ambulatory Visit: Payer: Self-pay | Admitting: Family Medicine

## 2019-07-25 DIAGNOSIS — E559 Vitamin D deficiency, unspecified: Secondary | ICD-10-CM

## 2019-08-09 ENCOUNTER — Encounter: Payer: Self-pay | Admitting: Obstetrics and Gynecology

## 2019-08-09 ENCOUNTER — Ambulatory Visit (INDEPENDENT_AMBULATORY_CARE_PROVIDER_SITE_OTHER): Payer: Medicaid Other | Admitting: Obstetrics and Gynecology

## 2019-08-09 ENCOUNTER — Other Ambulatory Visit: Payer: Self-pay | Admitting: Obstetrics and Gynecology

## 2019-08-09 ENCOUNTER — Other Ambulatory Visit (HOSPITAL_COMMUNITY): Payer: Self-pay | Admitting: *Deleted

## 2019-08-09 ENCOUNTER — Other Ambulatory Visit: Payer: Self-pay

## 2019-08-09 ENCOUNTER — Ambulatory Visit (HOSPITAL_COMMUNITY)
Admission: RE | Admit: 2019-08-09 | Discharge: 2019-08-09 | Disposition: A | Discharge status: Home / Self Care | Payer: Medicaid Other | Source: Ambulatory Visit | Attending: Obstetrics and Gynecology | Admitting: Obstetrics and Gynecology

## 2019-08-09 VITALS — BP 121/61 | HR 89 | Wt 260.0 lb

## 2019-08-09 DIAGNOSIS — O43212 Placenta accreta, second trimester: Secondary | ICD-10-CM | POA: Diagnosis not present

## 2019-08-09 DIAGNOSIS — Z98891 History of uterine scar from previous surgery: Secondary | ICD-10-CM | POA: Diagnosis not present

## 2019-08-09 DIAGNOSIS — O99840 Bariatric surgery status complicating pregnancy, unspecified trimester: Secondary | ICD-10-CM

## 2019-08-09 DIAGNOSIS — O09522 Supervision of elderly multigravida, second trimester: Secondary | ICD-10-CM | POA: Diagnosis not present

## 2019-08-09 DIAGNOSIS — O4402 Placenta previa specified as without hemorrhage, second trimester: Secondary | ICD-10-CM

## 2019-08-09 DIAGNOSIS — O0990 Supervision of high risk pregnancy, unspecified, unspecified trimester: Secondary | ICD-10-CM | POA: Diagnosis not present

## 2019-08-09 DIAGNOSIS — Z3A19 19 weeks gestation of pregnancy: Secondary | ICD-10-CM

## 2019-08-09 DIAGNOSIS — Z23 Encounter for immunization: Secondary | ICD-10-CM | POA: Diagnosis not present

## 2019-08-09 DIAGNOSIS — O99842 Bariatric surgery status complicating pregnancy, second trimester: Secondary | ICD-10-CM

## 2019-08-09 DIAGNOSIS — Z148 Genetic carrier of other disease: Secondary | ICD-10-CM | POA: Diagnosis not present

## 2019-08-09 DIAGNOSIS — O43219 Placenta accreta, unspecified trimester: Secondary | ICD-10-CM

## 2019-08-09 DIAGNOSIS — O99212 Obesity complicating pregnancy, second trimester: Secondary | ICD-10-CM

## 2019-08-09 DIAGNOSIS — O09521 Supervision of elderly multigravida, first trimester: Secondary | ICD-10-CM

## 2019-08-09 DIAGNOSIS — O0992 Supervision of high risk pregnancy, unspecified, second trimester: Secondary | ICD-10-CM | POA: Diagnosis not present

## 2019-08-09 DIAGNOSIS — O10912 Unspecified pre-existing hypertension complicating pregnancy, second trimester: Secondary | ICD-10-CM

## 2019-08-09 DIAGNOSIS — O34219 Maternal care for unspecified type scar from previous cesarean delivery: Secondary | ICD-10-CM

## 2019-08-09 DIAGNOSIS — O10919 Unspecified pre-existing hypertension complicating pregnancy, unspecified trimester: Secondary | ICD-10-CM

## 2019-08-09 DIAGNOSIS — O09892 Supervision of other high risk pregnancies, second trimester: Secondary | ICD-10-CM

## 2019-08-09 NOTE — Progress Notes (Signed)
   PRENATAL VISIT NOTE  Subjective:  Vanessa Braun is a 38 y.o. G5P1031 at [redacted]w[redacted]d being seen today for ongoing prenatal care.  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  Patient reports no complaints.  Contractions: Not present. Vag. Bleeding: None.   . Denies leaking of fluid.   The following portions of the patient's history were reviewed and updated as appropriate: allergies, current medications, past family history, past medical history, past social history, past surgical history and problem list.   Objective:   Vitals:   08/09/19 1056  BP: 121/61  Pulse: 89  Weight: 260 lb (117.9 kg)    Fetal Status: Fetal Heart Rate (bpm): 151         General:  Alert, oriented and cooperative. Patient is in no acute distress.  Skin: Skin is warm and dry. No rash noted.   Cardiovascular: Normal heart rate noted  Respiratory: Normal respiratory effort, no problems with respiration noted  Abdomen: Soft, gravid, appropriate for gestational age.  Pain/Pressure: Present     Pelvic: Cervical exam deferred        Extremities: Normal range of motion.  Edema: None  Mental Status: Normal mood and affect. Normal behavior. Normal judgment and thought content.   Assessment and Plan:  Pregnancy: A2Z3086 at [redacted]w[redacted]d  1. Flu vaccine need Counseled regarding risks/benefits of flu vaccine, patient accepts vaccine.   2. Supervision of high risk pregnancy, antepartum  3. Previous gastric bypass affecting pregnancy, antepartum No baby ASA  4.  History of cesarean delivery  5. AMA (advanced maternal age) multigravida 38+, first trimester  6. 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 Chronic hypertension during pregnancy, antepartum BP stable, no meds  8. 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 - Suspected on [place] today - F/u scheduled for 1 month - Will obtain MRI if needed - Reviewed accreta with patient, she is aware of diagnosis, will need MRI, reviewed plan [facility], likelihood of hysterectomy at the time of c-section, reviewed pelvic precautions, need to go to hospital with any bleeding, she verbalizes understanding of plan and instructions   Preterm labor symptoms and general obstetric precautions including but not limited to vaginal bleeding, contractions, leaking of fluid and fetal movement were reviewed in detail with the patient. Please refer to After Visit Summary for other counseling recommendations.   Return in about 3 weeks (around 08/30/2019) for high OB, in person.  Future Appointments  Date Time Provider [place]  09/06/2019 11:15 AM [place] NURSE [place] [facility]  09/06/2019 11:15 AM [facility] [place] 4 [facility]    Sloan Leiter, MD

## 2019-08-11 LAB — AFP, SERUM, OPEN SPINA BIFIDA
AFP MoM: 1.89
AFP Value: 74.9 ng/mL
Gest. Age on Collection Date: 19 weeks
Maternal Age At EDD: 37.7 yr
OSBR Risk 1 IN: 2068
Test Results:: NEGATIVE
Weight: 260 [lb_av]

## 2019-08-31 ENCOUNTER — Ambulatory Visit (INDEPENDENT_AMBULATORY_CARE_PROVIDER_SITE_OTHER): Payer: Medicaid Other | Admitting: Obstetrics and Gynecology

## 2019-08-31 ENCOUNTER — Encounter: Payer: Self-pay | Admitting: Obstetrics and Gynecology

## 2019-08-31 ENCOUNTER — Other Ambulatory Visit: Payer: Self-pay

## 2019-08-31 VITALS — BP 132/87 | HR 71 | Temp 98.0°F | Wt 268.5 lb

## 2019-08-31 DIAGNOSIS — O99840 Bariatric surgery status complicating pregnancy, unspecified trimester: Secondary | ICD-10-CM

## 2019-08-31 DIAGNOSIS — O4400 Placenta previa specified as without hemorrhage, unspecified trimester: Secondary | ICD-10-CM | POA: Insufficient documentation

## 2019-08-31 DIAGNOSIS — O10912 Unspecified pre-existing hypertension complicating pregnancy, second trimester: Secondary | ICD-10-CM

## 2019-08-31 DIAGNOSIS — O0990 Supervision of high risk pregnancy, unspecified, unspecified trimester: Secondary | ICD-10-CM

## 2019-08-31 DIAGNOSIS — O10919 Unspecified pre-existing hypertension complicating pregnancy, unspecified trimester: Secondary | ICD-10-CM

## 2019-08-31 DIAGNOSIS — O09521 Supervision of elderly multigravida, first trimester: Secondary | ICD-10-CM

## 2019-08-31 DIAGNOSIS — Z98891 History of uterine scar from previous surgery: Secondary | ICD-10-CM

## 2019-08-31 DIAGNOSIS — O09522 Supervision of elderly multigravida, second trimester: Secondary | ICD-10-CM

## 2019-08-31 DIAGNOSIS — Z3A22 22 weeks gestation of pregnancy: Secondary | ICD-10-CM

## 2019-08-31 DIAGNOSIS — O0992 Supervision of high risk pregnancy, unspecified, second trimester: Secondary | ICD-10-CM

## 2019-08-31 DIAGNOSIS — O99842 Bariatric surgery status complicating pregnancy, second trimester: Secondary | ICD-10-CM

## 2019-08-31 DIAGNOSIS — O43212 Placenta accreta, second trimester: Secondary | ICD-10-CM

## 2019-08-31 DIAGNOSIS — O4402 Placenta previa specified as without hemorrhage, second trimester: Secondary | ICD-10-CM

## 2019-08-31 DIAGNOSIS — Z148 Genetic carrier of other disease: Secondary | ICD-10-CM

## 2019-08-31 NOTE — Progress Notes (Signed)
   PRENATAL VISIT NOTE  Subjective:  Vanessa Braun is a 38 y.o. G5P1031 at [redacted]w[redacted]d being seen today for ongoing prenatal care.  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  Patient reports no complaints.  Contractions: Not present. Vag. Bleeding: None.   . Denies leaking of fluid.   The following portions of the patient's history were reviewed and updated as appropriate: allergies, current medications, past family history, past medical history, past social history, past surgical history and problem list.   Objective:   Vitals:   08/31/19 1124  BP: 132/87  Pulse: 71  Temp: 98 F (36.7 C)  Weight: 268 lb 8 oz (121.8 kg)   Fetal Status: Fetal Heart Rate (bpm):  152         General:  Alert, oriented and cooperative. Patient is in no acute distress.  Skin: Skin is warm and dry. No rash noted.   Cardiovascular: Normal heart rate noted  Respiratory: Normal respiratory effort, no problems with respiration noted  Abdomen: Soft, gravid, appropriate for gestational age.  Pain/Pressure: Absent     Pelvic: Cervical exam deferred        Extremities: Normal range of motion.  Edema: None  Mental Status: Normal mood and affect. Normal behavior. Normal judgment and thought content.   Assessment and Plan:  Pregnancy: Z6W1093 at [redacted]w[redacted]d  1. Supervision of high risk pregnancy, antepartum  2. AMA (advanced maternal age) multigravida 25+, first trimester  3. History of cesarean delivery  4. Previous gastric bypass affecting  pregnancy, antepartum  5. 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 Placenta previa in second trimester   Preterm labor symptoms and general obstetric precautions including but not limited to vaginal bleeding, contractions, leaking of fluid and fetal movement were reviewed in detail with the patient. Please refer to After Visit Summary for other counseling recommendations.   Return in about 3 weeks (around 09/21/2019) for high OB, in person.  Future Appointments  Date Time Provider [place]  09/06/2019 11:15 AM [place] NURSE [place] [facility]  09/06/2019 11:15 AM [place] 4 [facility]  09/24/2019 11:15 AM Wood[place] Mode, MD [facility]    Sloan Leiter, MD

## 2019-09-06 ENCOUNTER — Other Ambulatory Visit: Payer: Self-pay

## 2019-09-06 ENCOUNTER — Encounter (HOSPITAL_COMMUNITY): Payer: Self-pay

## 2019-09-06 ENCOUNTER — Ambulatory Visit (HOSPITAL_COMMUNITY)
Admission: RE | Admit: 2019-09-06 | Discharge: 2019-09-06 | Disposition: A | Discharge status: Home / Self Care | Payer: Medicaid Other | Source: Ambulatory Visit | Attending: Obstetrics and Gynecology | Admitting: Obstetrics and Gynecology

## 2019-09-06 ENCOUNTER — Ambulatory Visit (HOSPITAL_COMMUNITY): Payer: Medicaid Other | Admitting: *Deleted

## 2019-09-06 DIAGNOSIS — Z148 Genetic carrier of other disease: Secondary | ICD-10-CM | POA: Insufficient documentation

## 2019-09-06 DIAGNOSIS — O99212 Obesity complicating pregnancy, second trimester: Secondary | ICD-10-CM | POA: Diagnosis not present

## 2019-09-06 DIAGNOSIS — Z362 Encounter for other antenatal screening follow-up: Secondary | ICD-10-CM | POA: Diagnosis not present

## 2019-09-06 DIAGNOSIS — O09522 Supervision of elderly multigravida, second trimester: Secondary | ICD-10-CM

## 2019-09-06 DIAGNOSIS — O43212 Placenta accreta, second trimester: Secondary | ICD-10-CM

## 2019-09-06 DIAGNOSIS — O4402 Placenta previa specified as without hemorrhage, second trimester: Secondary | ICD-10-CM | POA: Diagnosis not present

## 2019-09-06 DIAGNOSIS — O0990 Supervision of high risk pregnancy, unspecified, unspecified trimester: Secondary | ICD-10-CM | POA: Insufficient documentation

## 2019-09-06 DIAGNOSIS — Z3A23 23 weeks gestation of pregnancy: Secondary | ICD-10-CM

## 2019-09-06 DIAGNOSIS — O09892 Supervision of other high risk pregnancies, second trimester: Secondary | ICD-10-CM

## 2019-09-06 DIAGNOSIS — O99842 Bariatric surgery status complicating pregnancy, second trimester: Secondary | ICD-10-CM

## 2019-09-06 DIAGNOSIS — O09292 Supervision of pregnancy with other poor reproductive or obstetric history, second trimester: Secondary | ICD-10-CM

## 2019-09-06 DIAGNOSIS — O43219 Placenta accreta, unspecified trimester: Secondary | ICD-10-CM | POA: Diagnosis not present

## 2019-09-06 DIAGNOSIS — O34219 Maternal care for unspecified type scar from previous cesarean delivery: Secondary | ICD-10-CM

## 2019-09-07 ENCOUNTER — Other Ambulatory Visit (HOSPITAL_COMMUNITY): Payer: Self-pay | Admitting: *Deleted

## 2019-09-07 DIAGNOSIS — O43212 Placenta accreta, second trimester: Secondary | ICD-10-CM

## 2019-09-11 ENCOUNTER — Encounter (HOSPITAL_COMMUNITY): Payer: Self-pay | Admitting: Obstetrics and Gynecology

## 2019-09-24 ENCOUNTER — Other Ambulatory Visit: Payer: Self-pay

## 2019-09-24 ENCOUNTER — Ambulatory Visit (INDEPENDENT_AMBULATORY_CARE_PROVIDER_SITE_OTHER): Payer: Medicaid Other | Admitting: Obstetrics & Gynecology

## 2019-09-24 VITALS — BP 114/66 | HR 72 | Wt 262.6 lb

## 2019-09-24 DIAGNOSIS — O0992 Supervision of high risk pregnancy, unspecified, second trimester: Secondary | ICD-10-CM

## 2019-09-24 DIAGNOSIS — O4402 Placenta previa specified as without hemorrhage, second trimester: Secondary | ICD-10-CM

## 2019-09-24 DIAGNOSIS — Z98891 History of uterine scar from previous surgery: Secondary | ICD-10-CM

## 2019-09-24 DIAGNOSIS — O0990 Supervision of high risk pregnancy, unspecified, unspecified trimester: Secondary | ICD-10-CM

## 2019-09-24 DIAGNOSIS — Z3A25 25 weeks gestation of pregnancy: Secondary | ICD-10-CM

## 2019-09-24 DIAGNOSIS — O43212 Placenta accreta, second trimester: Secondary | ICD-10-CM

## 2019-09-24 NOTE — Patient Instructions (Signed)
Cesarean Delivery Cesarean birth, or cesarean delivery, is the surgical delivery of a baby through an incision in the abdomen and the uterus. This may be referred to as a C-section. This procedure may be scheduled ahead of time, or it may be done in an emergency situation. Tell a health care provider about:  Any allergies you have.  All medicines you are taking, including vitamins, herbs, eye drops, creams, and over-the-counter medicines.  Any problems you or family members have had with anesthetic medicines.  Any blood disorders you have.  Any surgeries you have had.  Any medical conditions you have.  Whether you or any members of your family have a history of deep vein thrombosis (DVT) or pulmonary embolism (PE). What are the risks? Generally, this is a safe procedure. However, problems may occur, including:  Infection.  Bleeding.  Allergic reactions to medicines.  Damage to other structures or organs.  Blood clots.  Injury to your baby. What happens before the procedure? General instructions  Follow instructions from your health care provider about eating or drinking restrictions.  If you know that you are going to have a cesarean delivery, do not shave your pubic area. Shaving before the procedure may increase your risk of infection.  Plan to have someone take you home from the hospital.  Ask your health care provider what steps will be taken to prevent infection. These may include: ? Removing hair at the surgery site. ? Washing skin with a germ-killing soap. ? Taking antibiotic medicine.  Depending on the reason for your cesarean delivery, you may have a physical exam or additional testing, such as an ultrasound.  You may have your blood or urine tested. Questions for your health care provider  Ask your health care provider about: ? Changing or stopping your regular medicines. This is especially important if you are taking diabetes medicines or blood thinners.  ? Your pain management plan. This is especially important if you plan to breastfeed your baby. ? How long you will be in the hospital after the procedure. ? Any concerns you may have about receiving blood products, if you need them during the procedure. ? Cord blood banking, if you plan to collect your baby's umbilical cord blood.  You may also want to ask your health care provider: ? Whether you will be able to hold or breastfeed your baby while you are still in the operating room. ? Whether your baby can stay with you immediately after the procedure and during your recovery. ? Whether a family member or a person of your choice can go with you into the operating room and stay with you during the procedure, immediately after the procedure, and during your recovery. What happens during the procedure?   An IV will be inserted into one of your veins.  Fluid and medicines, such as antibiotics, will be given before the surgery.  Fetal monitors will be placed on your abdomen to check your baby's heart rate.  You may be given a special warming gown to wear to keep your temperature stable.  A catheter may be inserted into your bladder through your urethra. This drains your urine during the procedure.  You may be given one or more of the following: ? A medicine to numb the area (local anesthetic). ? A medicine to make you fall asleep (general anesthetic). ? A medicine (regional anesthetic) that is injected into your back or through a small thin tube placed in your back (spinal anesthetic or epidural anesthetic).   This numbs everything below the injection site and allows you to stay awake during your procedure. If this makes you feel nauseous, tell your health care provider. Medicines will be available to help reduce any nausea you may feel.  An incision will be made in your abdomen, and then in your uterus.  If you are awake during your procedure, you may feel tugging and pulling in your abdomen,  but you should not feel pain. If you feel pain, tell your health care provider immediately.  Your baby will be removed from your uterus. You may feel more pressure or pushing while this happens.  Immediately after birth, your baby will be dried and kept warm. You may be able to hold and breastfeed your baby.  The umbilical cord may be clamped and cut during this time. This usually occurs after waiting a period of 1-2 minutes after delivery.  Your placenta will be removed from your uterus.  Your incisions will be closed with stitches (sutures). Staples, skin glue, or adhesive strips may also be applied to the incision in your abdomen.  Bandages (dressings) may be placed over the incision in your abdomen. The procedure may vary among health care providers and hospitals. What happens after the procedure?  Your blood pressure, heart rate, breathing rate, and blood oxygen level will be monitored until you are discharged from the hospital.  You may continue to receive fluids and medicines through an IV.  You will have some pain. Medicines will be available to help control your pain.  To help prevent blood clots: ? You may be given medicines. ? You may have to wear compression stockings or devices. ? You will be encouraged to walk around when you are able.  Hospital staff will encourage and support bonding with your baby. Your hospital may have you and your baby to stay in the same room (rooming in) during your hospital stay to encourage successful bonding and breastfeeding.  You may be encouraged to cough and breathe deeply often. This helps to prevent lung problems.  If you have a catheter draining your urine, it will be removed as soon as possible after your procedure. Summary  Cesarean birth, or cesarean delivery, is the surgical delivery of a baby through an incision in the abdomen and the uterus.  Follow instructions from your health care provider about eating or drinking  restrictions before the procedure.  You will have some pain after the procedure. Medicines will be available to help control your pain.  Hospital staff will encourage and support bonding with your baby after the procedure. Your hospital may have you and your baby to stay in the same room (rooming in) during your hospital stay to encourage successful bonding and breastfeeding. This information is not intended to replace advice given to you by your health care provider. Make sure you discuss any questions you have with your health care provider. Document Released: 10/11/2005 Document Revised: 04/17/2018 Document Reviewed: 04/17/2018 Elsevier Patient Education  2020 [employer]  

## 2019-09-24 NOTE — Progress Notes (Signed)
   PRENATAL VISIT NOTE  Subjective:  Vanessa Braun is a 38 y.o. G5P1031 at [redacted]w[redacted]d being seen today for ongoing prenatal care.  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  Patient reports no complaints.  Contractions: Not present. Vag. Bleeding: None.  Movement: Present. Denies leaking of fluid.   The following portions of the patient's history were reviewed and updated as appropriate: allergies, current medications, past family history, past medical history, past social history, past surgical history and problem list.   Objective:   Vitals:   09/24/19 1102  BP: 114/66  Pulse: 72  Weight: 262 lb 9.6 oz (119.1 kg)    Fetal Status: Fetal Heart Rate (bpm): 150 Fundal Height: 26 cm Movement: Present     General:  Alert, oriented and cooperative. Patient is in no acute distress.  Skin: Skin is warm and dry. No rash noted.   Cardiovascular: Normal heart rate noted  Respiratory: Normal respiratory effort, no problems with respiration noted  Abdomen: Soft, gravid, appropriate for gestational age.  Pain/Pressure: Present     Pelvic: Cervical exam deferred        Extremities: Normal range of motion.  Edema: None  Mental Status: Normal mood and affect. Normal behavior. Normal judgment and thought content.   Assessment and Plan:  Pregnancy: [id number] at [redacted]w[redacted]d 1. Placenta previa in second trimester   2. 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 Possible c-hyst, referral needed - Ambulatory referral to Obstetrics /  Gynecology  3. History of cesarean    4. Supervision of high risk pregnancy, antepartum   Preterm labor symptoms and general obstetric precautions including but not limited to vaginal bleeding, contractions, leaking of fluid and fetal movement were reviewed in detail with the patient. Please refer to After Visit Summary for other counseling recommendations.   Return in about 2 weeks (around 10/08/2019) for 2 hr.  Future Appointments  Date Time Provider [place]  10/04/2019 10:15 AM [place] [facility]  10/04/2019 10:15 AM [place] [facility]  10/08/2019  8:35 AM Emily Filbert, MD [facility]    Emeterio Reeve, MD

## 2019-10-04 ENCOUNTER — Ambulatory Visit (HOSPITAL_COMMUNITY)
Admission: RE | Admit: 2019-10-04 | Discharge: 2019-10-04 | Disposition: A | Discharge status: Home / Self Care | Payer: Medicaid Other | Source: Ambulatory Visit | Attending: Obstetrics and Gynecology | Admitting: Obstetrics and Gynecology

## 2019-10-04 ENCOUNTER — Encounter (HOSPITAL_COMMUNITY): Payer: Self-pay

## 2019-10-04 ENCOUNTER — Other Ambulatory Visit: Payer: Self-pay

## 2019-10-04 ENCOUNTER — Other Ambulatory Visit (HOSPITAL_COMMUNITY): Payer: Self-pay | Admitting: *Deleted

## 2019-10-04 ENCOUNTER — Ambulatory Visit (HOSPITAL_COMMUNITY): Payer: Medicaid Other | Admitting: *Deleted

## 2019-10-04 DIAGNOSIS — O43219 Placenta accreta, unspecified trimester: Secondary | ICD-10-CM

## 2019-10-04 DIAGNOSIS — O99842 Bariatric surgery status complicating pregnancy, second trimester: Secondary | ICD-10-CM

## 2019-10-04 DIAGNOSIS — O43212 Placenta accreta, second trimester: Secondary | ICD-10-CM | POA: Diagnosis present

## 2019-10-04 DIAGNOSIS — O0990 Supervision of high risk pregnancy, unspecified, unspecified trimester: Secondary | ICD-10-CM | POA: Insufficient documentation

## 2019-10-04 DIAGNOSIS — Z148 Genetic carrier of other disease: Secondary | ICD-10-CM

## 2019-10-04 DIAGNOSIS — O4402 Placenta previa specified as without hemorrhage, second trimester: Secondary | ICD-10-CM

## 2019-10-04 DIAGNOSIS — O09522 Supervision of elderly multigravida, second trimester: Secondary | ICD-10-CM | POA: Diagnosis not present

## 2019-10-04 DIAGNOSIS — Z362 Encounter for other antenatal screening follow-up: Secondary | ICD-10-CM | POA: Diagnosis not present

## 2019-10-04 DIAGNOSIS — O09892 Supervision of other high risk pregnancies, second trimester: Secondary | ICD-10-CM

## 2019-10-04 DIAGNOSIS — Z3A27 27 weeks gestation of pregnancy: Secondary | ICD-10-CM

## 2019-10-04 DIAGNOSIS — O99212 Obesity complicating pregnancy, second trimester: Secondary | ICD-10-CM

## 2019-10-04 DIAGNOSIS — O34219 Maternal care for unspecified type scar from previous cesarean delivery: Secondary | ICD-10-CM

## 2019-10-08 ENCOUNTER — Encounter: Payer: Medicaid Other | Admitting: Obstetrics & Gynecology

## 2019-10-09 ENCOUNTER — Encounter: Payer: Self-pay | Admitting: Family Medicine

## 2019-10-11 ENCOUNTER — Encounter: Payer: Self-pay | Admitting: Obstetrics and Gynecology

## 2019-10-11 ENCOUNTER — Other Ambulatory Visit: Payer: Self-pay

## 2019-10-11 ENCOUNTER — Ambulatory Visit (INDEPENDENT_AMBULATORY_CARE_PROVIDER_SITE_OTHER): Payer: Medicaid Other | Admitting: Obstetrics and Gynecology

## 2019-10-11 ENCOUNTER — Encounter (HOSPITAL_COMMUNITY): Payer: Self-pay | Admitting: Obstetrics & Gynecology

## 2019-10-11 ENCOUNTER — Inpatient Hospital Stay (HOSPITAL_COMMUNITY)
Admission: AD | Admit: 2019-10-11 | Discharge: 2019-10-11 | Disposition: A | Discharge status: Home / Self Care | Payer: Medicaid Other | Attending: Obstetrics & Gynecology | Admitting: Obstetrics & Gynecology

## 2019-10-11 VITALS — BP 113/70 | HR 75 | Wt 262.5 lb

## 2019-10-11 DIAGNOSIS — O43213 Placenta accreta, third trimester: Secondary | ICD-10-CM

## 2019-10-11 DIAGNOSIS — Z3A28 28 weeks gestation of pregnancy: Secondary | ICD-10-CM | POA: Insufficient documentation

## 2019-10-11 DIAGNOSIS — O0993 Supervision of high risk pregnancy, unspecified, third trimester: Secondary | ICD-10-CM

## 2019-10-11 DIAGNOSIS — O10913 Unspecified pre-existing hypertension complicating pregnancy, third trimester: Secondary | ICD-10-CM

## 2019-10-11 DIAGNOSIS — Z8249 Family history of ischemic heart disease and other diseases of the circulatory system: Secondary | ICD-10-CM | POA: Insufficient documentation

## 2019-10-11 DIAGNOSIS — Z98891 History of uterine scar from previous surgery: Secondary | ICD-10-CM

## 2019-10-11 DIAGNOSIS — O09521 Supervision of elderly multigravida, first trimester: Secondary | ICD-10-CM

## 2019-10-11 DIAGNOSIS — Z880 Allergy status to penicillin: Secondary | ICD-10-CM | POA: Insufficient documentation

## 2019-10-11 DIAGNOSIS — N939 Abnormal uterine and vaginal bleeding, unspecified: Secondary | ICD-10-CM

## 2019-10-11 DIAGNOSIS — Z148 Genetic carrier of other disease: Secondary | ICD-10-CM

## 2019-10-11 DIAGNOSIS — Z800 Family history of malignant neoplasm of digestive organs: Secondary | ICD-10-CM | POA: Insufficient documentation

## 2019-10-11 DIAGNOSIS — Z833 Family history of diabetes mellitus: Secondary | ICD-10-CM | POA: Insufficient documentation

## 2019-10-11 DIAGNOSIS — O10919 Unspecified pre-existing hypertension complicating pregnancy, unspecified trimester: Secondary | ICD-10-CM

## 2019-10-11 DIAGNOSIS — Z9884 Bariatric surgery status: Secondary | ICD-10-CM | POA: Diagnosis not present

## 2019-10-11 DIAGNOSIS — O0990 Supervision of high risk pregnancy, unspecified, unspecified trimester: Secondary | ICD-10-CM

## 2019-10-11 DIAGNOSIS — Z888 Allergy status to other drugs, medicaments and biological substances status: Secondary | ICD-10-CM | POA: Diagnosis not present

## 2019-10-11 DIAGNOSIS — O4693 Antepartum hemorrhage, unspecified, third trimester: Secondary | ICD-10-CM | POA: Insufficient documentation

## 2019-10-11 DIAGNOSIS — O34219 Maternal care for unspecified type scar from previous cesarean delivery: Secondary | ICD-10-CM | POA: Diagnosis not present

## 2019-10-11 DIAGNOSIS — O26893 Other specified pregnancy related conditions, third trimester: Secondary | ICD-10-CM

## 2019-10-11 DIAGNOSIS — O163 Unspecified maternal hypertension, third trimester: Secondary | ICD-10-CM | POA: Insufficient documentation

## 2019-10-11 DIAGNOSIS — O09523 Supervision of elderly multigravida, third trimester: Secondary | ICD-10-CM

## 2019-10-11 DIAGNOSIS — Z886 Allergy status to analgesic agent status: Secondary | ICD-10-CM | POA: Insufficient documentation

## 2019-10-11 LAB — URINALYSIS, ROUTINE W REFLEX MICROSCOPIC
Bilirubin Urine: NEGATIVE
Glucose, UA: NEGATIVE mg/dL
Ketones, ur: NEGATIVE mg/dL
Leukocytes,Ua: NEGATIVE
Nitrite: NEGATIVE
Protein, ur: NEGATIVE mg/dL
Specific Gravity, Urine: 1.005 (ref 1.005–1.030)
pH: 6 (ref 5.0–8.0)

## 2019-10-11 LAB — WET PREP, GENITAL
Clue Cells Wet Prep HPF POC: NONE SEEN
Sperm: NONE SEEN
Trich, Wet Prep: NONE SEEN
Yeast Wet Prep HPF POC: NONE SEEN

## 2019-10-11 NOTE — Progress Notes (Signed)
Subjective:  Vanessa Braun is a 38 y.o. G5P1031 at [redacted]w[redacted]d being seen today for ongoing prenatal care.  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  Patient reports general discomforts of pregnancy.  Contractions: Irritability. Vag. Bleeding: None.  Movement: Present. Denies leaking of fluid.   The following portions of the patient's history were reviewed and updated as appropriate: allergies, current medications, past family history, past medical history, past social history, past surgical history and problem list. Problem list updated.  Objective:   Vitals:   10/11/19 0943  BP: 113/70  Pulse: 75  Weight: 262 lb 8 oz (119.1 kg)    Fetal Status: Fetal Heart Rate (bpm): 143   Movement: Present     General:  Alert, oriented and cooperative. Patient is in no acute distress.  Skin: Skin is warm and dry. No rash noted.   Cardiovascular: Normal heart rate noted  Respiratory: Normal respiratory effort, no problems with respiration noted  Abdomen: Soft, gravid, appropriate for gestational age. Pain/Pressure: Present     Pelvic:  Cervical exam deferred        Extremities: Normal range of motion.  Edema: None  Mental Status: Normal mood and affect. Normal behavior. Normal judgment and thought content.   Urinalysis:      Assessment and Plan:  Pregnancy: [id number] at [redacted]w[redacted]d  1. Supervision of high risk pregnancy, antepartum Stable Did not fast for Glucola Will schedule appt for next week  2. Placenta accreta in third  trimester Referral has been made to [facility] Pt has not heard from them yet. Advised pt to inform us at next weeks lab appt if she has not heard form [facility]  3. Chronic hypertension during pregnancy, antepartum BP controlled  4. AMA (advanced maternal age) multigravida 79+, first trimester Stable  5. History of cesarean delivery See above  6. 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 Please refer to After Visit Summary for other counseling recommendations.  Return in about 2 weeks (around 10/25/2019) for OB visit, virtual, MD provider.   Chancy Milroy, MD

## 2019-10-11 NOTE — Patient Instructions (Signed)
Third Trimester of Pregnancy The third trimester is from week 28 through week 40 (months 7 through 9). The third trimester is a time when the unborn baby (fetus) is growing rapidly. At the end of the ninth month, the fetus is about 20 inches in length and weighs 6-10 pounds. Body changes during your third trimester Your body will continue to go through many changes during pregnancy. The changes vary from woman to woman. During the third trimester:  Your weight will continue to increase. You can expect to gain 25-35 pounds (11-16 kg) by the end of the pregnancy.  You may begin to get stretch marks on your hips, abdomen, and breasts.  You may urinate more often because the fetus is moving lower into your pelvis and pressing on your bladder.  You may develop or continue to have heartburn. This is caused by increased hormones that slow down muscles in the digestive tract.  You may develop or continue to have constipation because increased hormones slow digestion and cause the muscles that push waste through your intestines to relax.  You may develop hemorrhoids. These are swollen veins (varicose veins) in the rectum that can itch or be painful.  You may develop swollen, bulging veins (varicose veins) in your legs.  You may have increased body aches in the pelvis, back, or thighs. This is due to weight gain and increased hormones that are relaxing your joints.  You may have changes in your hair. These can include thickening of your hair, rapid growth, and changes in texture. Some women also have hair loss during or after pregnancy, or hair that feels dry or thin. Your hair will most likely return to normal after your baby is born.  Your breasts will continue to grow and they will continue to become tender. A yellow fluid (colostrum) may leak from your breasts. This is the first milk you are producing for your baby.  Your belly button may stick out.  You may notice more swelling in your hands,  face, or ankles.  You may have increased tingling or numbness in your hands, arms, and legs. The skin on your belly may also feel numb.  You may feel short of breath because of your expanding uterus.  You may have more problems sleeping. This can be caused by the size of your belly, increased need to urinate, and an increase in your body's metabolism.  You may notice the fetus "dropping," or moving lower in your abdomen (lightening).  You may have increased vaginal discharge.  You may notice your joints feel loose and you may have pain around your pelvic bone. What to expect at prenatal visits You will have prenatal exams every 2 weeks until week 36. Then you will have weekly prenatal exams. During a routine prenatal visit:  You will be weighed to make sure you and the baby are growing normally.  Your blood pressure will be taken.  Your abdomen will be measured to track your baby's growth.  The fetal heartbeat will be listened to.  Any test results from the previous visit will be discussed.  You may have a cervical check near your due date to see if your cervix has softened or thinned (effaced).  You will be tested for Group B streptococcus. This happens between 35 and 37 weeks. Your health care provider may ask you:  What your birth plan is.  How you are feeling.  If you are feeling the baby move.  If you have had any abnormal   symptoms, such as leaking fluid, bleeding, severe headaches, or abdominal cramping.  If you are using any tobacco products, including cigarettes, chewing tobacco, and electronic cigarettes.  If you have any questions. Other tests or screenings that may be performed during your third trimester include:  Blood tests that check for low iron levels (anemia).  Fetal testing to check the health, activity level, and growth of the fetus. Testing is done if you have certain medical conditions or if there are problems during the pregnancy.  Nonstress test  (NST). This test checks the health of your baby to make sure there are no signs of problems, such as the baby not getting enough oxygen. During this test, a belt is placed around your belly. The baby is made to move, and its heart rate is monitored during movement. What is false labor? False labor is a condition in which you feel small, irregular tightenings of the muscles in the womb (contractions) that usually go away with rest, changing position, or drinking water. These are called Braxton Hicks contractions. Contractions may last for hours, days, or even weeks before true labor sets in. If contractions come at regular intervals, become more frequent, increase in intensity, or become painful, you should see your health care provider. What are the signs of labor?  Abdominal cramps.  Regular contractions that start at 10 minutes apart and become stronger and more frequent with time.  Contractions that start on the top of the uterus and spread down to the lower abdomen and back.  Increased pelvic pressure and dull back pain.  A watery or bloody mucus discharge that comes from the vagina.  Leaking of amniotic fluid. This is also known as your "water breaking." It could be a slow trickle or a gush. Let your health care provider know if it has a color or strange odor. If you have any of these signs, call your health care provider right away, even if it is before your due date. Follow these instructions at home: Medicines  Follow your health care provider's instructions regarding medicine use. Specific medicines may be either safe or unsafe to take during pregnancy.  Take a prenatal vitamin that contains at least 600 micrograms (mcg) of folic acid.  If you develop constipation, try taking a stool softener if your health care provider approves. Eating and drinking   Eat a balanced diet that includes fresh fruits and vegetables, whole grains, good sources of protein such as meat, eggs, or tofu,  and low-fat dairy. Your health care provider will help you determine the amount of weight gain that is right for you.  Avoid raw meat and uncooked cheese. These carry germs that can cause birth defects in the baby.  If you have low calcium intake from food, talk to your health care provider about whether you should take a daily calcium supplement.  Eat four or five small meals rather than three large meals a day.  Limit foods that are high in fat and processed sugars, such as fried and sweet foods.  To prevent constipation: ? Drink enough fluid to keep your urine clear or pale yellow. ? Eat foods that are high in fiber, such as fresh fruits and vegetables, whole grains, and beans. Activity  Exercise only as directed by your health care provider. Most women can continue their usual exercise routine during pregnancy. Try to exercise for 30 minutes at least 5 days a week. Stop exercising if you experience uterine contractions.  Avoid heavy lifting.  Do   not exercise in extreme heat or humidity, or at high altitudes.  Wear low-heel, comfortable shoes.  Practice good posture.  You may continue to have sex unless your health care provider tells you otherwise. Relieving pain and discomfort  Take frequent breaks and rest with your legs elevated if you have leg cramps or low back pain.  Take warm sitz baths to soothe any pain or discomfort caused by hemorrhoids. Use hemorrhoid cream if your health care provider approves.  Wear a good support bra to prevent discomfort from breast tenderness.  If you develop varicose veins: ? Wear support pantyhose or compression stockings as told by your healthcare provider. ? Elevate your feet for 15 minutes, 3-4 times a day. Prenatal care  Write down your questions. Take them to your prenatal visits.  Keep all your prenatal visits as told by your health care provider. This is important. Safety  Wear your seat belt at all times when driving.  Make  a list of emergency phone numbers, including numbers for family, friends, the hospital, and police and fire departments. General instructions  Avoid cat litter boxes and soil used by cats. These carry germs that can cause birth defects in the baby. If you have a cat, ask someone to clean the litter box for you.  Do not travel far distances unless it is absolutely necessary and only with the approval of your health care provider.  Do not use hot tubs, steam rooms, or saunas.  Do not drink alcohol.  Do not use any products that contain nicotine or tobacco, such as cigarettes and e-cigarettes. If you need help quitting, ask your health care provider.  Do not use any medicinal herbs or unprescribed drugs. These chemicals affect the formation and growth of the baby.  Do not douche or use tampons or scented sanitary pads.  Do not cross your legs for long periods of time.  To prepare for the arrival of your baby: ? Take prenatal classes to understand, practice, and ask questions about labor and delivery. ? Make a trial run to the hospital. ? Visit the hospital and tour the maternity area. ? Arrange for maternity or paternity leave through employers. ? Arrange for family and friends to take care of pets while you are in the hospital. ? Purchase a rear-facing car seat and make sure you know how to install it in your car. ? Pack your hospital bag. ? Prepare the baby's nursery. Make sure to remove all pillows and stuffed animals from the baby's crib to prevent suffocation.  Visit your dentist if you have not gone during your pregnancy. Use a soft toothbrush to brush your teeth and be gentle when you floss. Contact a health care provider if:  You are unsure if you are in labor or if your water has broken.  You become dizzy.  You have mild pelvic cramps, pelvic pressure, or nagging pain in your abdominal area.  You have lower back pain.  You have persistent nausea, vomiting, or diarrhea.   You have an unusual or bad smelling vaginal discharge.  You have pain when you urinate. Get help right away if:  Your water breaks before 37 weeks.  You have regular contractions less than 5 minutes apart before 37 weeks.  You have a fever.  You are leaking fluid from your vagina.  You have spotting or bleeding from your vagina.  You have severe abdominal pain or cramping.  You have rapid weight loss or weight gain.  You have   shortness of breath with chest pain.  You notice sudden or extreme swelling of your face, hands, ankles, feet, or legs.  Your baby makes fewer than 10 movements in 2 hours.  You have severe headaches that do not go away when you take medicine.  You have vision changes. Summary  The third trimester is from week 28 through week 40, months 7 through 9. The third trimester is a time when the unborn baby (fetus) is growing rapidly.  During the third trimester, your discomfort may increase as you and your baby continue to gain weight. You may have abdominal, leg, and back pain, sleeping problems, and an increased need to urinate.  During the third trimester your breasts will keep growing and they will continue to become tender. A yellow fluid (colostrum) may leak from your breasts. This is the first milk you are producing for your baby.  False labor is a condition in which you feel small, irregular tightenings of the muscles in the womb (contractions) that eventually go away. These are called Braxton Hicks contractions. Contractions may last for hours, days, or even weeks before true labor sets in.  Signs of labor can include: abdominal cramps; regular contractions that start at 10 minutes apart and become stronger and more frequent with time; watery or bloody mucus discharge that comes from the vagina; increased pelvic pressure and dull back pain; and leaking of amniotic fluid. This information is not intended to replace advice given to you by your health  care provider. Make sure you discuss any questions you have with your health care provider. Document Released: 10/05/2001 Document Revised: 02/01/2019 Document Reviewed: 11/16/2016 Elsevier Patient Education  2020 [employer]  

## 2019-10-11 NOTE — MAU Note (Signed)
.   Vanessa Braun is a 38 y.o. at [redacted]w[redacted]d here in MAU reporting: vaginal spotting today. Pt states she was told to come in due to having a placenta accreta. Mild lower abdominal cramping  Onset of complaint: today Pain score: 2 Vitals:   10/11/19 1704  BP: (!) 123/59  Pulse: 62  Resp: 16  Temp: 98.2 F (36.8 C)  SpO2: 100%     FHT:140 Lab orders placed from triage: [facility]

## 2019-10-11 NOTE — MAU Note (Signed)
Pt left with out signing but all discharge information was gone over and pt reports understanding with no further questions.

## 2019-10-11 NOTE — MAU Provider Note (Signed)
Patient Vanessa Braun  Is a 38 y.o. G5P1031 At 41w0dhere with complaints of vaginal bleeding at 4pm.  She denies decreased fetal movements, vaginal discharge, blurry vision, HA, floating spots, difficulties breathing.   She has a known placenta accreta, [facility] She is waiting for that appointment to be scheduled.  She had a C/section in May for NRFHT.  History     CSN: [id number] Arrival date and time: 10/11/19 1625   First Provider Initiated Contact with Patient 10/11/19 1750      Chief Complaint  Patient presents with  . Vaginal Bleeding   Vaginal Bleeding The patient's primary symptoms include vaginal bleeding. This is a new problem. The current episode started today. Associated symptoms include abdominal pain. Associated symptoms comments: She had a little bit of cramping at the same time that she noticed the bleeding; the cramping is now resolved. This is cramping that comes and goes; it is not associated with the bleeding. . The vaginal discharge was bloody. The vaginal bleeding is spotting. She has not been passing clots. She has not been passing tissue.    OB History    Gravida  5   Para  1   Term  1   Preterm  0   AB  3   Living  1     SAB  3   TAB  0   Ectopic  0   Multiple  0   Live Births  1           Past Medical History:  Diagnosis Date  . Abnormal Pap smear   . Chronic dermatitis of hands   . Complication of anesthesia    trouble waking up  . Eczema   . GERD (gastroesophageal reflux disease)    lost weight with bypass surgery, no problems since  . Hypertension   . PCOS (polycystic ovarian syndrome)     Past Surgical History:  Procedure Laterality Date  . ADENOIDECTOMY    . CESAREAN SECTION N/A 01/30/2019   Procedure: CESAREAN SECTION;  Surgeon: NCaren Macadam MD;  Location: [facility] LD ORS;  Service: Obstetrics;  Laterality: N/A;  . DILATION AND CURETTAGE OF UTERUS    . DILATION AND EVACUATION N/A  04/01/2014   Procedure: DILATATION AND EVACUATION;  Surgeon: VElveria Royals MD;  [place];  Service: Gynecology;  Laterality: N/A;  . DILATION AND EVACUATION N/A 12/23/2017   Procedure: DILATATION AND EVACUATION;  Surgeon: HLavonia Drafts MD;  [place] [facility];  Service: Gynecology;  Laterality: N/A;  . GASTRIC BYPASS    . TONSILLECTOMY AND ADENOIDECTOMY  1999    Family History  Problem Relation Age of Onset  . Diabetes Mother   . Hyperlipidemia Mother   . Hypertension Mother   . Diabetes Father   . Kidney disease Father   . Hypertension Father   . Hyperlipidemia Father   . Cancer Father        pancreatic  . Gestational diabetes Sister   . Depression Sister   . Diabetes Sister   . Heart disease Maternal Grandmother   . Heart disease Paternal Grandmother     Social History   Tobacco Use  . Smoking status: Never Smoker  . Smokeless tobacco: Never Used  Substance Use Topics  . Alcohol use: Not Currently  . Drug use: No    Allergies:  Allergies  Allergen Reactions  . Aspirin     Contraindicated due to gastric bypass  .  Other     "anything antibacterial"; some creams for dermatitis  . Penicillins Itching and Rash    Medications Prior to Admission  Medication Sig Dispense Refill Last Dose  . Blood Pressure Monitoring (BLOOD PRESSURE KIT) DEVI 1 Device by Does not apply route See admin instructions. Blood pressure cuff 1 Device 0 10/11/2019 at Unknown time  . folic acid (FOLVITE) 1 MG tablet Take 1 tablet (1 mg total) by mouth daily. 60 tablet 3 10/11/2019 at Unknown time  . Prenatal Vit-Fe Fumarate-FA (PREPLUS) 27-1 MG TABS Take 1 tablet by mouth daily. 30 tablet 6 10/11/2019 at Unknown time    Review of Systems  HENT: Negative.   Eyes: Negative.   Respiratory: Negative.   Gastrointestinal: Positive for abdominal pain.  Genitourinary: Positive for vaginal bleeding.  Neurological: Negative.   Psychiatric/Behavioral: Negative.    Physical Exam    Blood pressure (!) 123/59, pulse 62, temperature 98.2 F (36.8 C), resp. rate 16, last menstrual period 03/14/2019, SpO2 100 %, unknown if currently breastfeeding.  Physical Exam  Constitutional: She is oriented to person, place, and time. She appears well-developed.  HENT:  Head: Normocephalic.  Respiratory: Effort normal.  GI: Soft.  Genitourinary:    Genitourinary Comments: NEFG; no blood in the vagina, no discharge, lesions. Cervix is closed, long, no CMT, no suprapubic or adnexal tenderness.    Musculoskeletal:        General: Normal range of motion.     Cervical back: Normal range of motion.  Neurological: She is alert and oriented to person, place, and time.  Skin: Skin is warm and dry.    MAU Course  Procedures  MDM -NST: 130 bpm, mod var, present acel, neg decels, no contractions -wet prep is normal -GC pending  Assessment and Plan   1. Vaginal spotting    2. Patient stable for discharge with recommendations to keep follow up appt; patient is also waiting transfer to [facility] due to Accreta.   3. Reviewed strict return precautions; patient agrees with plan of care.   Mervyn Skeeters [place] 10/11/2019, 6:06 PM

## 2019-10-11 NOTE — Discharge Instructions (Signed)
Vaginal Bleeding During Pregnancy, Third Trimester ° °A small amount of bleeding (spotting) from the vagina is common during pregnancy. Sometimes the bleeding is normal and is not a problem, and sometimes it is a sign of something serious. Tell your doctor about any bleeding from your vagina right away. °Follow these instructions at home: °Activity °· Follow your doctor's instructions about how active you can be. Your doctor may recommend that you: °? Stay in bed and only get up to use the bathroom. °? Continue light activity. °· If needed, make plans for someone to help you with your normal activities. °· Ask your doctor if it is safe for you to drive. °· Do not lift anything that is heavier than 10 lb (4.5 kg) until your doctor says that this is safe. °· Do not have sex or orgasms until your doctor says that this is safe. °Medicines °· Take over-the-counter and prescription medicines only as told by your doctor. °· Do not take aspirin. It can cause bleeding. °General instructions °· Watch your condition for any changes. °· Write down: °? The number of pads you use each day. °? How often you change pads. °? How soaked (saturated) your pads are. °· Do not use tampons. °· Do not douche. °· If you pass any tissue from your vagina, save the tissue to show your doctor. °· Keep all follow-up visits as told by your doctor. This is important. °Contact a doctor if: °· You have vaginal bleeding at any time during pregnancy. °· You have cramps. °· You have a fever. °Get help right away if: °· You have very bad cramps. °· You have very bad pain in your back or belly (abdomen). °· You have a gush of fluid from your vagina. °· You pass large clots or a lot of tissue from your vagina. °· Your bleeding gets worse. °· You feel light-headed or weak. °· You pass out (faint). °· Your baby is moving less than usual, or not moving at all. °Summary °· Tell your doctor about any bleeding from your vagina right away. °· Follow instructions  from your doctor about how active you can be. You may need someone to help you with your normal activities. °This information is not intended to replace advice given to you by your health care provider. Make sure you discuss any questions you have with your health care provider. °Document Released: 02/25/2014 Document Revised: 01/30/2019 Document Reviewed: 01/12/2017 °Elsevier Patient Education © 2020 [employer] ° °

## 2019-10-12 LAB — GC/CHLAMYDIA PROBE AMP (~~LOC~~) NOT AT ARMC
Chlamydia: NEGATIVE
Comment: NEGATIVE
Comment: NORMAL
Neisseria Gonorrhea: NEGATIVE

## 2019-10-15 ENCOUNTER — Other Ambulatory Visit: Payer: Self-pay

## 2019-10-15 ENCOUNTER — Inpatient Hospital Stay (HOSPITAL_COMMUNITY)
Admission: AD | Admit: 2019-10-15 | Discharge: 2019-10-15 | Disposition: A | Discharge status: Other Acute Inpatient Hospital | Payer: Medicaid Other | Attending: Obstetrics and Gynecology | Admitting: Obstetrics and Gynecology

## 2019-10-15 ENCOUNTER — Encounter (HOSPITAL_COMMUNITY): Payer: Self-pay | Admitting: Obstetrics and Gynecology

## 2019-10-15 DIAGNOSIS — O43213 Placenta accreta, third trimester: Secondary | ICD-10-CM | POA: Insufficient documentation

## 2019-10-15 DIAGNOSIS — Z8249 Family history of ischemic heart disease and other diseases of the circulatory system: Secondary | ICD-10-CM | POA: Insufficient documentation

## 2019-10-15 DIAGNOSIS — O42919 Preterm premature rupture of membranes, unspecified as to length of time between rupture and onset of labor, unspecified trimester: Secondary | ICD-10-CM

## 2019-10-15 DIAGNOSIS — Z20828 Contact with and (suspected) exposure to other viral communicable diseases: Secondary | ICD-10-CM | POA: Diagnosis not present

## 2019-10-15 DIAGNOSIS — Z833 Family history of diabetes mellitus: Secondary | ICD-10-CM | POA: Diagnosis not present

## 2019-10-15 DIAGNOSIS — O34219 Maternal care for unspecified type scar from previous cesarean delivery: Secondary | ICD-10-CM | POA: Diagnosis not present

## 2019-10-15 DIAGNOSIS — O99843 Bariatric surgery status complicating pregnancy, third trimester: Secondary | ICD-10-CM | POA: Insufficient documentation

## 2019-10-15 DIAGNOSIS — O09523 Supervision of elderly multigravida, third trimester: Secondary | ICD-10-CM | POA: Insufficient documentation

## 2019-10-15 DIAGNOSIS — Z880 Allergy status to penicillin: Secondary | ICD-10-CM | POA: Insufficient documentation

## 2019-10-15 DIAGNOSIS — O42913 Preterm premature rupture of membranes, unspecified as to length of time between rupture and onset of labor, third trimester: Secondary | ICD-10-CM | POA: Diagnosis not present

## 2019-10-15 DIAGNOSIS — Z3A28 28 weeks gestation of pregnancy: Secondary | ICD-10-CM | POA: Diagnosis not present

## 2019-10-15 DIAGNOSIS — N898 Other specified noninflammatory disorders of vagina: Secondary | ICD-10-CM | POA: Diagnosis present

## 2019-10-15 LAB — URINALYSIS, ROUTINE W REFLEX MICROSCOPIC
Bilirubin Urine: NEGATIVE
Glucose, UA: NEGATIVE mg/dL
Hgb urine dipstick: NEGATIVE
Ketones, ur: NEGATIVE mg/dL
Leukocytes,Ua: NEGATIVE
Nitrite: NEGATIVE
Protein, ur: NEGATIVE mg/dL
Specific Gravity, Urine: 1.009 (ref 1.005–1.030)
pH: 6 (ref 5.0–8.0)

## 2019-10-15 LAB — WET PREP, GENITAL
Clue Cells Wet Prep HPF POC: NONE SEEN
Sperm: NONE SEEN
Trich, Wet Prep: NONE SEEN
Yeast Wet Prep HPF POC: NONE SEEN

## 2019-10-15 LAB — POCT FERN TEST: POCT Fern Test: NEGATIVE

## 2019-10-15 LAB — AMNISURE RUPTURE OF MEMBRANE (ROM) NOT AT ARMC: Amnisure ROM: POSITIVE

## 2019-10-15 LAB — POC SARS CORONAVIRUS 2 AG: SARS Coronavirus 2 Ag: NEGATIVE

## 2019-10-15 MED ORDER — AZITHROMYCIN 250 MG PO TABS: 1000.0000 mg | ORAL_TABLET | Freq: Once | ORAL | Status: DC

## 2019-10-15 MED ORDER — LACTATED RINGERS IV SOLN: INTRAVENOUS | Status: DC

## 2019-10-15 MED ORDER — CEFAZOLIN SODIUM-DEXTROSE 1-4 GM/50ML-% IV SOLN
1.0000 g | Freq: Three times a day (TID) | INTRAVENOUS | Status: DC
  Administered 2019-10-15: 22:00:00 1 g via INTRAVENOUS
  Filled 2019-10-15 (×2): qty 50

## 2019-10-15 MED ORDER — AZITHROMYCIN 250 MG PO TABS
1000.0000 mg | ORAL_TABLET | Freq: Once | ORAL | Status: AC
  Administered 2019-10-15: 1000 mg via ORAL
  Filled 2019-10-15: qty 4

## 2019-10-15 MED ORDER — CLINDAMYCIN PHOSPHATE 600 MG/50ML IV SOLN: 600.0000 mg | Freq: Three times a day (TID) | INTRAVENOUS | Status: DC

## 2019-10-15 MED ORDER — BETAMETHASONE SOD PHOS & ACET 6 (3-3) MG/ML IJ SUSP
12.0000 mg | Freq: Once | INTRAMUSCULAR | Status: AC
  Administered 2019-10-15: 19:00:00 12 mg via INTRAMUSCULAR
  Filled 2019-10-15: qty 5

## 2019-10-15 MED ORDER — CEPHALEXIN 500 MG PO CAPS: 500.0000 mg | ORAL_CAPSULE | Freq: Four times a day (QID) | ORAL | Status: DC

## 2019-10-15 NOTE — MAU Note (Signed)
Went to pee, and something came out when she stood up.  Saw some clear mucous when wiped.  Clear fluid has been coming since (1520). No bleeding. No pain.

## 2019-10-15 NOTE — MAU Provider Note (Signed)
Chief Complaint:  Rupture of Membranes   First Provider Initiated Contact with Patient 10/15/19 1623     HPI: Vanessa Braun is a 38 y.o. [id number] at 47w4dwho presents to maternity admissions reporting leaking of fluid. Has been leaking clear fluid since 1520 today. Denies abdominal pain or vaginal bleeding. Good fetal movement. .  Pregnancy Course: [facility]. C/section earlier this year for NRFHT. Placenta accreta with current pregnancy.   Past Medical History:  Diagnosis Date  . Abnormal Pap smear   . Chronic dermatitis of hands   . Complication of anesthesia    trouble waking up  . Eczema   . GERD (gastroesophageal reflux disease)    lost weight with bypass surgery, no problems since  . Hypertension   . PCOS (polycystic ovarian syndrome)    OB History  Gravida Para Term Preterm AB Living  '5 1 1 ' 0 3 1  SAB TAB Ectopic Multiple Live Births  3 0 0 0 1    # Outcome Date GA Lbr Len/2nd Weight Sex Delivery Anes PTL Lv  5 Current           4 Term 01/30/19 [id number] g F CS-LTranv EPI N LIV     Complications: Fetal heart rate decelerations, delivered  3 SAB 2019 5w28w0d      2 SAB 2013 [id number]     1 SAB 2012 [id number]     Past Surgical History:  Procedure Laterality Date  . ADENOIDECTOMY    . CESAREAN SECTION N/A 01/30/2019   Procedure: CESAREAN SECTION;  Surgeon: NewtoCaren Macadam  Location: [facility];  Service: Obstetrics;  Laterality: N/A;  . DILATION AND CURETTAGE OF UTERUS    . DILATION AND EVACUATION N/A 04/01/2014   Procedure: DILATATION AND EVACUATION;  Surgeon: VaishElveria Royals  [facility]  Service: Gynecology;  Laterality: N/A;  . DILATION AND EVACUATION N/A 12/23/2017   Procedure: DILATATION AND EVACUATION;  Surgeon: HarraLavonia Drafts  [facility]  Service: Gynecology;  Laterality: N/A;  . GASTRIC BYPASS    . TONSILLECTOMY AND ADENOIDECTOMY  1999   Family History  Problem Relation Age of Onset  . Diabetes Mother   .  Hyperlipidemia Mother   . Hypertension Mother   . Diabetes Father   . Kidney disease Father   . Hypertension Father   . Hyperlipidemia Father   . Cancer Father        pancreatic  . Gestational diabetes Sister   . Depression Sister   . Diabetes Sister   . Heart disease Maternal Grandmother   . Heart disease Paternal Grandmother    Social History   Tobacco Use  . Smoking status: Never Smoker  . Smokeless tobacco: Never Used  Substance Use Topics  . Alcohol use: Not Currently  . Drug use: No   Allergies  Allergen Reactions  . Aspirin     Contraindicated due to gastric bypass  . Other     "anything antibacterial"; some creams for dermatitis  . Penicillins Itching and Rash   Medications Prior to Admission  Medication Sig Dispense Refill Last Dose  . folic acid (FOLVITE) 1 MG tablet Take 1 tablet (1 mg total) by mouth daily. 60 tablet 3 10/15/2019 at Unknown time  . Prenatal Vit-Fe Fumarate-FA (PREPLUS) 27-1 MG TABS Take 1 tablet by mouth daily. 30 tablet 6 10/15/2019 at Unknown time  . Blood Pressure Monitoring (  BLOOD PRESSURE KIT) DEVI 1 Device by Does not apply route See admin instructions. Blood pressure cuff 1 Device 0     I have reviewed patient's Past Medical Hx, Surgical Hx, Family Hx, Social Hx, medications and allergies.   ROS:  Review of Systems  Constitutional: Negative.   Gastrointestinal: Negative.   Genitourinary: Positive for vaginal discharge. Negative for dysuria and vaginal bleeding.    Physical Exam   Patient Vitals for the past 24 hrs:  BP Temp Temp src Pulse Resp SpO2 Height Weight  10/15/19 1647 (!) 119/58 -- -- 72 -- -- -- --  10/15/19 1632 133/61 -- -- 69 -- -- -- --  10/15/19 1616 125/72 -- -- 79 -- -- -- --  10/15/19 1607 121/65 -- -- 80 -- -- -- --  10/15/19 1541 (!) 145/80 97.9 F (36.6 C) Oral 77 18 100 % '5\' 9"'  (1.753 m) 118.9 kg    Constitutional: Well-developed, well-nourished female in no acute distress.  Cardiovascular: normal  rate & rhythm, no murmur Respiratory: normal effort, lung sounds clear throughout GI: Abd soft, non-tender, gravid appropriate for gestational age. Pos BS x 4 MS: Extremities nontender, no edema, normal ROM Neurologic: Alert and oriented x 4.  GU:     SSE: Clear fluid pooling. No blood. Cervix visually closed. Digital exam deferred.     NST:  Baseline: 135 bpm, Variability: Good {> 6 bpm), Accelerations: Non-reactive but appropriate for gestational age and Decelerations: Absent   Labs: Results for orders placed or performed during the hospital encounter of 10/15/19 (from the past 24 hour(s))  Wet prep, genital     Status: Abnormal   Collection Time: 10/15/19  4:45 PM   Specimen: PATH Cytology Cervicovaginal Ancillary Only  Result Value Ref Range   Yeast Wet Prep HPF POC NONE SEEN NONE SEEN   Trich, Wet Prep NONE SEEN NONE SEEN   Clue Cells Wet Prep HPF POC NONE SEEN NONE SEEN   WBC, Wet Prep HPF POC MODERATE (A) NONE SEEN   Sperm NONE SEEN   Urinalysis, Routine w reflex microscopic     Status: None   Collection Time: 10/15/19  4:53 PM  Result Value Ref Range   Color, Urine YELLOW YELLOW   APPearance CLEAR CLEAR   Specific Gravity, Urine 1.009 1.005 - 1.030   pH 6.0 5.0 - 8.0   Glucose, UA NEGATIVE NEGATIVE mg/dL   Hgb urine dipstick NEGATIVE NEGATIVE   Bilirubin Urine NEGATIVE NEGATIVE   Ketones, ur NEGATIVE NEGATIVE mg/dL   Protein, ur NEGATIVE NEGATIVE mg/dL   Nitrite NEGATIVE NEGATIVE   Leukocytes,Ua NEGATIVE NEGATIVE  Amnisure rupture of membrane (rom)not at [facility]     Status: None   Collection Time: 10/15/19  4:53 PM  Result Value Ref Range   Amnisure ROM POSITIVE   Fern Test     Status: None   Collection Time: 10/15/19  5:13 PM  Result Value Ref Range   POCT Fern Test Negative = intact amniotic membranes   POC SARS Coronavirus 2 Ag     Status: None   Collection Time: 10/15/19  6:59 PM  Result Value Ref Range   SARS Coronavirus 2 Ag NEGATIVE NEGATIVE    Imaging:   No results found.  MAU Course: Orders Placed This Encounter  Procedures  . Wet prep, genital  . Culture, beta strep (group b only)  . Urinalysis, Routine w reflex microscopic  . Amnisure rupture of membrane (rom)not at [facility]  . Fern Test  . POC  SARS Coronavirus 2 Ag-ED - Nasal Swab (BD Veritor Kit)  . POC SARS Coronavirus 2 Ag  . Saline lock IV   Meds ordered this encounter  Medications  . betamethasone acetate-betamethasone sodium phosphate (CELESTONE) injection 12 mg    MDM: SSE performed. Pooling of clear fluid. Cervix visually closed. Fern negative Amnisure positive  GC/CT, wet prep, u/a, & GBS culture collected  Dr. Elonda Husky notified. Pt will need to be transferred to [facility] due to placenta accreta COVID swab pending BMZ given in MAU  COVID swab negative.  Assessment: 1. Preterm premature rupture of membranes, unspecified duration to onset of labor   2. 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 [redacted] weeks gestation of pregnancy     Plan: Transfer to [facility] Dr. Elonda Husky to speak with receiving facility regarding transfer  Jorje Guild, NP 10/15/2019 7:46 PM

## 2019-10-16 LAB — GC/CHLAMYDIA PROBE AMP (~~LOC~~) NOT AT ARMC
Chlamydia: NEGATIVE
Comment: NEGATIVE
Comment: NORMAL
Neisseria Gonorrhea: NEGATIVE

## 2019-10-17 LAB — CULTURE, BETA STREP (GROUP B ONLY)

## 2019-10-22 ENCOUNTER — Telehealth: Payer: Self-pay | Admitting: Family Medicine

## 2019-10-22 NOTE — Telephone Encounter (Signed)
Received a call from patient stating she is at [facility]. Her water broke, and she will be there until she deliverers.

## 2019-10-26 HISTORY — PX: TOTAL ABDOMINAL HYSTERECTOMY: SHX209

## 2019-10-26 HISTORY — PX: BILATERAL SALPINGECTOMY: SHX5743

## 2019-10-26 MED ORDER — SODIUM CHLORIDE FLUSH 0.9 % IV SOLN
10.00 | INTRAVENOUS | Status: DC
Start: ? — End: 2019-10-26

## 2019-10-26 MED ORDER — ACETAMINOPHEN 325 MG PO TABS
650.00 | ORAL_TABLET | ORAL | Status: DC
Start: ? — End: 2019-10-26

## 2019-10-26 MED ORDER — FAMOTIDINE 20 MG PO TABS
20.00 | ORAL_TABLET | ORAL | Status: DC
Start: ? — End: 2019-10-26

## 2019-10-26 MED ORDER — OXYTOCIN-SODIUM CHLORIDE 30-0.9 UT/500ML-% IV SOLN
30.00 | INTRAVENOUS | Status: DC
Start: ? — End: 2019-10-26

## 2019-10-26 MED ORDER — DEXTROSE 10 % IV SOLN
125.00 | INTRAVENOUS | Status: DC
Start: ? — End: 2019-10-26

## 2019-10-26 MED ORDER — LACTATED RINGERS IV SOLN
500.00 | INTRAVENOUS | Status: DC
Start: ? — End: 2019-10-26

## 2019-10-26 MED ORDER — POLYETHYLENE GLYCOL 3350 17 GM/SCOOP PO POWD
17.00 | ORAL | Status: DC
Start: ? — End: 2019-10-26

## 2019-10-26 MED ORDER — GLUCOSE 40 % PO GEL
15.00 | ORAL | Status: DC
Start: ? — End: 2019-10-26

## 2019-10-26 MED ORDER — SODIUM CHLORIDE FLUSH 0.9 % IV SOLN
10.00 | INTRAVENOUS | Status: DC
Start: 2019-10-28 — End: 2019-10-26

## 2019-10-26 MED ORDER — PRENATAL 28-0.8 MG PO TABS
1.00 | ORAL_TABLET | ORAL | Status: DC
Start: 2019-10-29 — End: 2019-10-26

## 2019-10-28 MED ORDER — CYCLOBENZAPRINE HCL 10 MG PO TABS
10.00 | ORAL_TABLET | ORAL | Status: DC
Start: ? — End: 2019-10-28

## 2019-10-29 ENCOUNTER — Encounter: Payer: Medicaid Other | Admitting: Family Medicine

## 2019-10-29 ENCOUNTER — Other Ambulatory Visit: Payer: Medicaid Other

## 2019-11-01 MED ORDER — GUAIFENESIN 100 MG/5ML PO SYRP
200.00 | ORAL_SOLUTION | ORAL | Status: DC
Start: ? — End: 2019-11-01

## 2019-11-01 MED ORDER — BENZOCAINE-MENTHOL 6-10 MG MT LOZG
1.00 | LOZENGE | OROMUCOSAL | Status: DC
Start: ? — End: 2019-11-01

## 2019-11-01 MED ORDER — NALOXONE HCL 0.4 MG/ML IJ SOLN
0.10 | INTRAMUSCULAR | Status: DC
Start: ? — End: 2019-11-01

## 2019-11-01 MED ORDER — SODIUM CHLORIDE FLUSH 0.9 % IV SOLN
10.00 | INTRAVENOUS | Status: DC
Start: 2019-11-01 — End: 2019-11-01

## 2019-11-01 MED ORDER — ACETAMINOPHEN 325 MG PO TABS
650.00 | ORAL_TABLET | ORAL | Status: DC
Start: 2019-11-01 — End: 2019-11-01

## 2019-11-01 MED ORDER — SIMETHICONE 80 MG PO CHEW
80.00 | CHEWABLE_TABLET | ORAL | Status: DC
Start: ? — End: 2019-11-01

## 2019-11-01 MED ORDER — DSS 100 MG PO CAPS
100.00 | ORAL_CAPSULE | ORAL | Status: DC
Start: ? — End: 2019-11-01

## 2019-11-01 MED ORDER — VARICELLA VIRUS VACCINE LIVE 1350 PFU/0.5ML ~~LOC~~ INJ
0.50 | INJECTION | SUBCUTANEOUS | Status: DC
Start: ? — End: 2019-11-01

## 2019-11-01 MED ORDER — MEASLES, MUMPS & RUBELLA VAC IJ SOLR
0.50 | INTRAMUSCULAR | Status: DC
Start: ? — End: 2019-11-01

## 2019-11-01 MED ORDER — GENERIC EXTERNAL MEDICATION
Status: DC
Start: ? — End: 2019-11-01

## 2019-11-01 MED ORDER — BISACODYL 10 MG RE SUPP
10.00 | RECTAL | Status: DC
Start: ? — End: 2019-11-01

## 2019-11-01 MED ORDER — ALUM & MAG HYDROXIDE-SIMETH 200-200-20 MG/5ML PO SUSP
30.00 | ORAL | Status: DC
Start: ? — End: 2019-11-01

## 2019-11-01 MED ORDER — SALINE NASAL SPRAY 0.65 % NA SOLN
2.00 | NASAL | Status: DC
Start: ? — End: 2019-11-01

## 2019-11-01 MED ORDER — WITCH HAZEL-GLYCERIN EX PADS
1.00 | MEDICATED_PAD | CUTANEOUS | Status: DC
Start: ? — End: 2019-11-01

## 2019-11-01 MED ORDER — SODIUM CHLORIDE 0.9 % IV SOLN
250.00 | INTRAVENOUS | Status: DC
Start: ? — End: 2019-11-01

## 2019-11-01 MED ORDER — PRENATAL 28-0.8 MG PO TABS
1.00 | ORAL_TABLET | ORAL | Status: DC
Start: 2019-11-02 — End: 2019-11-01

## 2019-11-01 MED ORDER — CYCLOBENZAPRINE HCL 10 MG PO TABS
10.00 | ORAL_TABLET | ORAL | Status: DC
Start: ? — End: 2019-11-01

## 2019-11-01 MED ORDER — BENZOCAINE-MENTHOL 20-0.5 % EX AERO
INHALATION_SPRAY | CUTANEOUS | Status: DC
Start: ? — End: 2019-11-01

## 2019-11-01 MED ORDER — TETANUS-DIPHTH-ACELL PERTUSSIS 5-2.5-18.5 LF-MCG/0.5 IM SUSP
0.50 | INTRAMUSCULAR | Status: DC
Start: ? — End: 2019-11-01

## 2019-11-01 MED ORDER — MAGNESIUM HYDROXIDE 400 MG/5ML PO SUSP
30.00 | ORAL | Status: DC
Start: ? — End: 2019-11-01

## 2019-11-01 MED ORDER — SODIUM CHLORIDE FLUSH 0.9 % IV SOLN
10.00 | INTRAVENOUS | Status: DC
Start: ? — End: 2019-11-01

## 2019-11-01 MED ORDER — ENEMA 7-19 GM/118ML RE ENEM
1.00 | ENEMA | RECTAL | Status: DC
Start: ? — End: 2019-11-01

## 2019-11-02 ENCOUNTER — Ambulatory Visit (HOSPITAL_COMMUNITY): Payer: Medicaid Other

## 2019-11-02 ENCOUNTER — Encounter (HOSPITAL_COMMUNITY): Payer: Self-pay

## 2019-11-06 ENCOUNTER — Ambulatory Visit (INDEPENDENT_AMBULATORY_CARE_PROVIDER_SITE_OTHER): Payer: Medicaid Other | Admitting: Lactation Services

## 2019-11-06 ENCOUNTER — Other Ambulatory Visit: Payer: Self-pay

## 2019-11-06 DIAGNOSIS — Z4889 Encounter for other specified surgical aftercare: Secondary | ICD-10-CM

## 2019-11-06 NOTE — Progress Notes (Signed)
Pt reports infant is in the NICU at [facility]. Infant born on 10/29/2018. She is doing well and and is on room air. Pt with emergency c/s and Hysterectomy.   Pt reports the pain is manageable. She is having pain more with getting up and down. Pt has classical c/s incision from pubic bone to above umbilicus. 5 steri strips removed in office.  Pt has an 1/2 inch area just to the left of the belly button where skin is not well approximated, internal stitches intact. She has another area mid abdomen that is 1.5 inches long where the skin is not well approximated, internal stitches are intact. Infant with pencil eraser sized area that is not well approximated where her abdomen meets her pelvis, granulation tissue is present. Incision without drainage, odor, or redness.   Luna Kitchens , CNM in to assess. Silver Nitrate applied to lowest open area on incision. Steri strips reapplied to the other areas.   Pt has follow up Feb 16 for follow up at [facility].

## 2019-11-13 ENCOUNTER — Encounter: Payer: Self-pay | Admitting: *Deleted

## 2019-11-13 ENCOUNTER — Ambulatory Visit: Payer: Medicaid Other

## 2019-11-16 ENCOUNTER — Encounter (HOSPITAL_COMMUNITY): Payer: Self-pay | Admitting: *Deleted

## 2019-11-16 ENCOUNTER — Inpatient Hospital Stay (HOSPITAL_COMMUNITY)
Admission: AD | Admit: 2019-11-16 | Discharge: 2019-11-16 | Disposition: A | Discharge status: Home / Self Care | Payer: Medicaid Other | Attending: Obstetrics and Gynecology | Admitting: Obstetrics and Gynecology

## 2019-11-16 ENCOUNTER — Other Ambulatory Visit: Payer: Self-pay

## 2019-11-16 DIAGNOSIS — Z4889 Encounter for other specified surgical aftercare: Secondary | ICD-10-CM | POA: Insufficient documentation

## 2019-11-16 DIAGNOSIS — Z5189 Encounter for other specified aftercare: Secondary | ICD-10-CM

## 2019-11-16 LAB — URINALYSIS, ROUTINE W REFLEX MICROSCOPIC
Bilirubin Urine: NEGATIVE
Glucose, UA: NEGATIVE mg/dL
Hgb urine dipstick: NEGATIVE
Ketones, ur: NEGATIVE mg/dL
Leukocytes,Ua: NEGATIVE
Nitrite: NEGATIVE
Protein, ur: NEGATIVE mg/dL
Specific Gravity, Urine: 1.015 (ref 1.005–1.030)
pH: 6 (ref 5.0–8.0)

## 2019-11-16 NOTE — MAU Note (Signed)
Had c/s - hyst on 1/4.  One little part is open and draining, smells bad.  Not painful. First noted a couple days ago.feels like she has a UTI, burns when she pees.

## 2019-11-16 NOTE — Discharge Instructions (Signed)
-  wash with hydrogen peroxide 3 times daily -keep gauze over it (place in underwear) -Follow up appt on 1/25

## 2019-11-17 NOTE — MAU Provider Note (Signed)
Patient Vanessa Braun is a 39 y.o. [id number] At 2 weeks s/p classical cesearian section. She reports that her incision is "leaking and infected". She reports that it smells to her. She denies any other complications; has had a nurse check at [facility] for this problem already and was treated with silver nitrate.  Patient Vitals for the past 24 hrs:  BP Pulse Resp  11/16/19 1208 119/61 68 16    Incision appears clean, dry and intact except for at the base of the incision above the suprapubic bone. Skin has separated slightly, small amount of serous fluid. No odor, no redness or streaking. No tunneling. Uterus is soft and non-tender. No odor or pus.   Dr. Shawnie Pons examined patient and applied silver nitrate; patient was instructed to rinse with hydrogen peroxide three times a day and keep follow up appt.  All questions answered; patient is safe for discharge.   Charlesetta Garibaldi Aedan Geimer 11/17/2019, 11:21 AM

## 2019-11-19 ENCOUNTER — Ambulatory Visit: Payer: Medicaid Other

## 2019-11-21 ENCOUNTER — Ambulatory Visit (INDEPENDENT_AMBULATORY_CARE_PROVIDER_SITE_OTHER): Payer: Medicaid Other | Admitting: *Deleted

## 2019-11-21 ENCOUNTER — Other Ambulatory Visit: Payer: Self-pay

## 2019-11-21 VITALS — BP 114/65 | HR 51 | Ht 69.0 in | Wt 247.8 lb

## 2019-11-21 DIAGNOSIS — Z4889 Encounter for other specified surgical aftercare: Secondary | ICD-10-CM

## 2019-11-21 NOTE — Progress Notes (Addendum)
Pt in office today @ [phone] for wound check. Vertical incision from recent C/section/hysterectomy is well-healed and has no open areas. Pt has an area @ center of previous transverse C/S scar from 01/30/19 where the skin edges have separated. This area is approximately 1cm in length. There is no bleeding or drainage and granulation tissue is present. Dr. Vergie Living in to assess wound. Pt was advised to cleanse the area twice daily with soap and water, then pack the opening with a small amount of gauze which has been dampened with tap water.  The gauze should then be covered with a band-aid or tape.  Supplies for dressing change were given to pt. Pt voiced understanding of all information and instructions given. Follow up appt for wound check will be scheduled. Pt has PP appt @ [facility] on 12/11/19.

## 2019-11-26 NOTE — Progress Notes (Signed)
Patient seen and assessed by nursing staff during this encounter. I have reviewed the chart and agree with the documentation and plan.  Wellman Bing, MD 11/26/2019 3:47 PM

## 2019-11-29 ENCOUNTER — Other Ambulatory Visit: Payer: Self-pay

## 2019-11-29 ENCOUNTER — Ambulatory Visit (INDEPENDENT_AMBULATORY_CARE_PROVIDER_SITE_OTHER): Payer: Medicaid Other | Admitting: *Deleted

## 2019-11-29 VITALS — BP 113/75 | HR 61 | Wt 245.5 lb

## 2019-11-29 DIAGNOSIS — Z5189 Encounter for other specified aftercare: Secondary | ICD-10-CM

## 2019-11-29 NOTE — Progress Notes (Addendum)
Pt here today for wound check s/p f/u wound check visit on 1/27 from rpt c-section/hysterectomy.  Transverse Incision well approximated.  Observed open area measuring approximately 1 cm in length in the center of previous c-section scar.  No odor, no erythema, no bleeding, and no edema.  Dr. Alysia Penna requested to assess incision with Diane, RN assisting.   Addison Naegeli, RN 11/29/19

## 2019-12-03 NOTE — Progress Notes (Addendum)
(  Late entry)  Pt's wound was assessed on 2/4 @ 0950 with Dr. Alysia Penna in attendance. Per my exam, the open area @ transverse incision from previous C/S from April 2020 showed good progression of healing. The wound is still approximately 1cm in length/diameter however is now more shallow and granulation tissue present. No bleeding or drainage was noted. Pt was advised per Dr. Alysia Penna to continue wound care as previously instructed on 1/27.  (Twice daily - apply small amount of damp gauze to affected area and cover with dry gauze or band-aid)  Pt has PP visit scheduled @ [facility] on 2/16. Pt was instructed by Dr. Alysia Penna to follow up in our office if needed. She voiced understanding of all information and instructions given.

## 2019-12-13 NOTE — Progress Notes (Signed)
Agree with A & P. 

## 2019-12-17 IMAGING — US US OB TRANSVAGINAL
1 series · 15 of 28 positions shown · non-contrast
Comparison: None.

CLINICAL DATA: Vaginal bleeding. First trimester pregnancy with
inconclusive fetal viability.

EXAM:
TRANSVAGINAL OB ULTRASOUND
TECHNIQUE: Transvaginal ultrasound was performed for complete evaluation of the
gestation as well as the maternal uterus, adnexal regions, and
pelvic cul-de-sac.

[Series 1: us ob transvaginal · 15 of 37 slices shown]
[im 1/37]
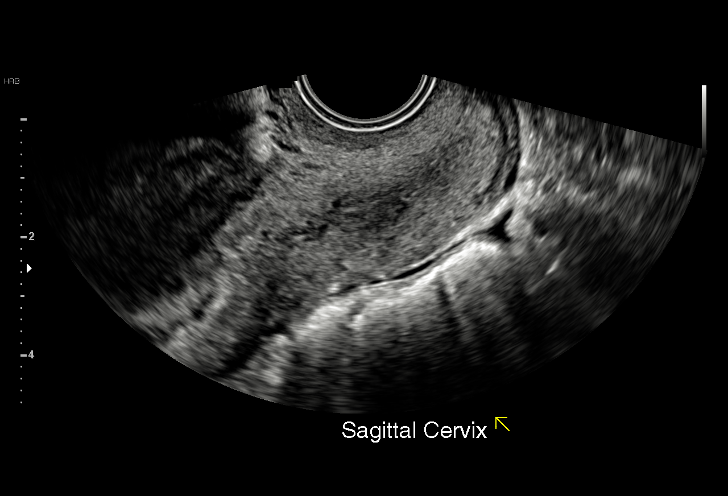
[im 3/37]
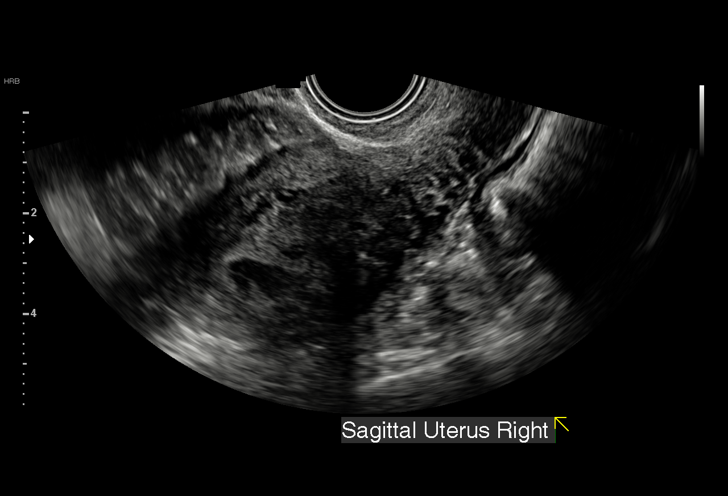
[im 6/37]
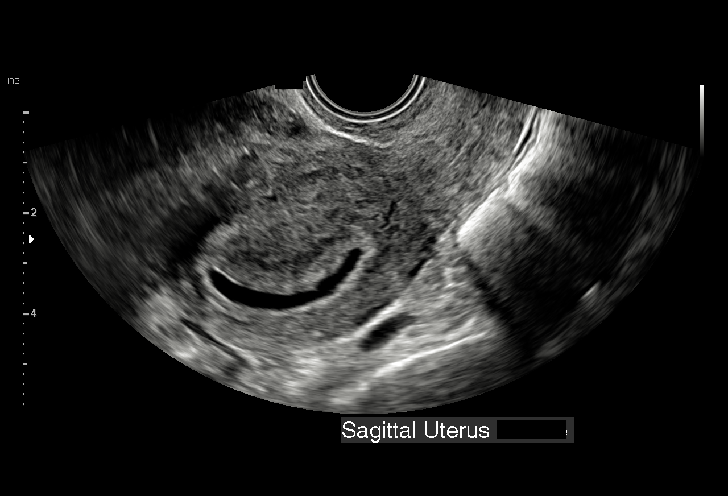
[im 9/37]
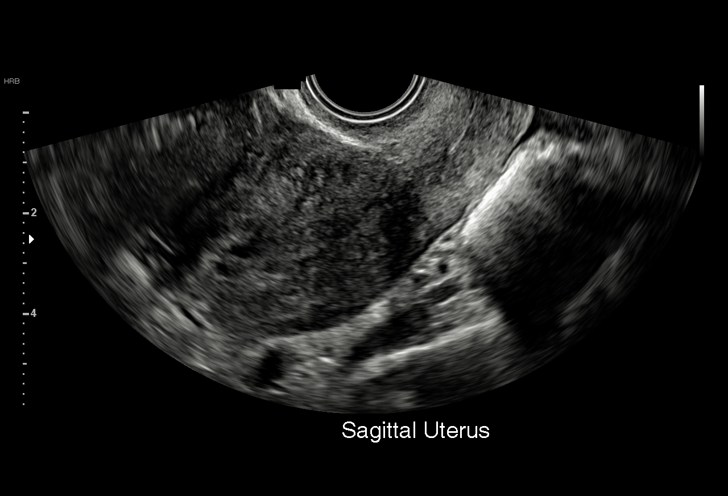
[im 11/37]
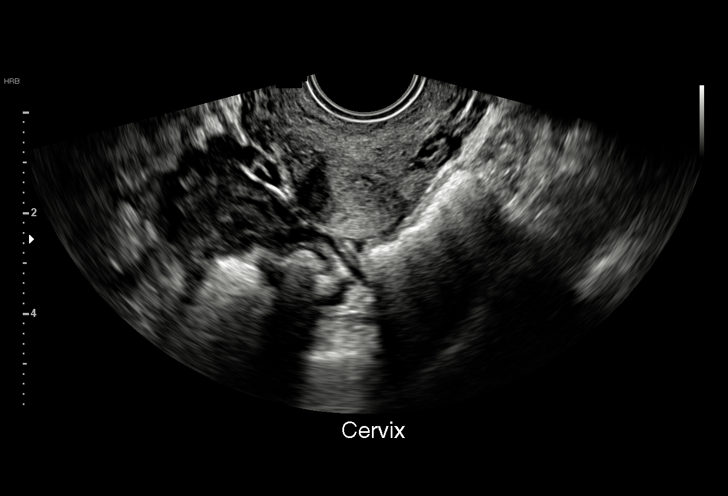
[im 14/37]
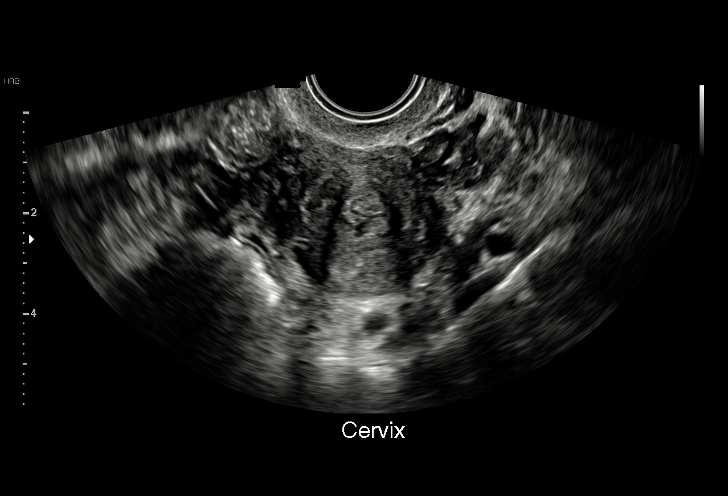
[im 17/37]
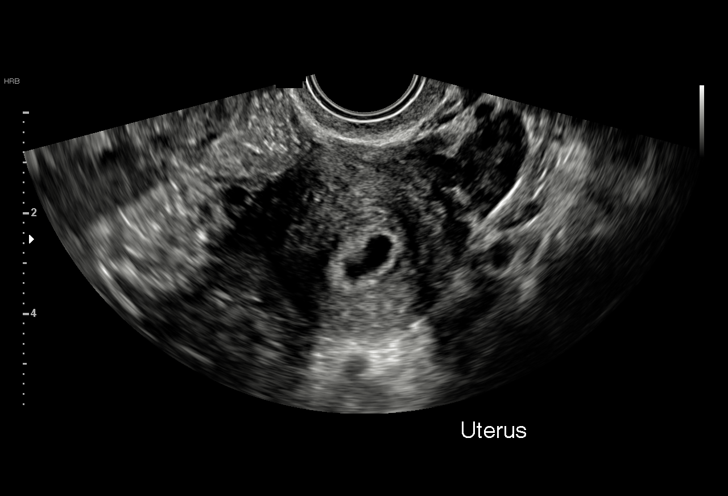
[im 19/37]
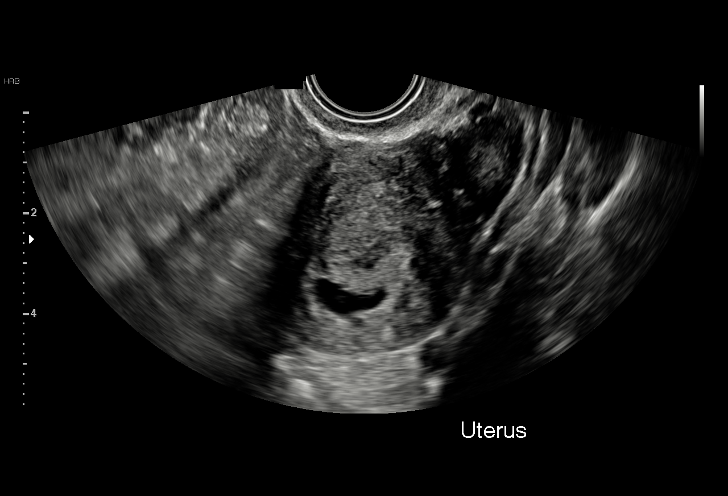
[im 21/37]
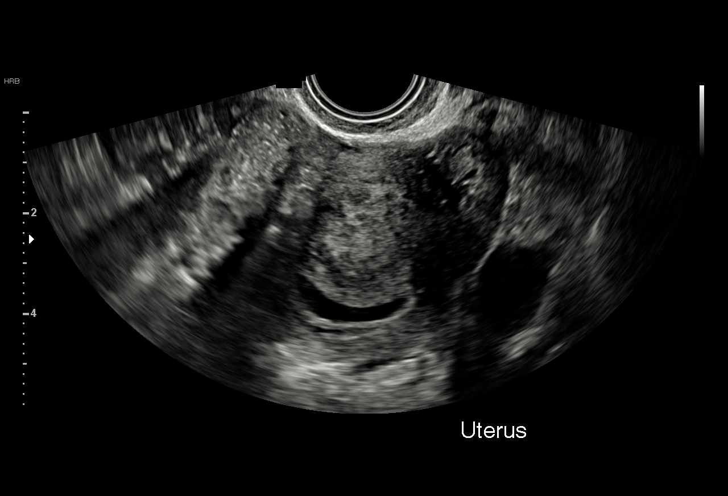
[im 23/37]
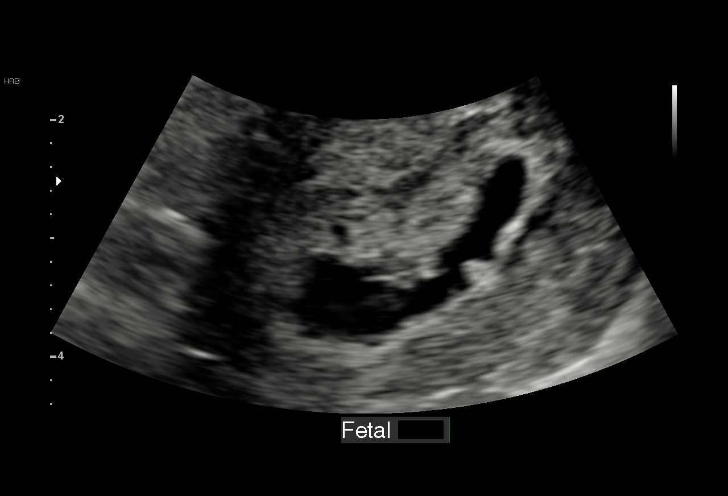
[im 26/37]
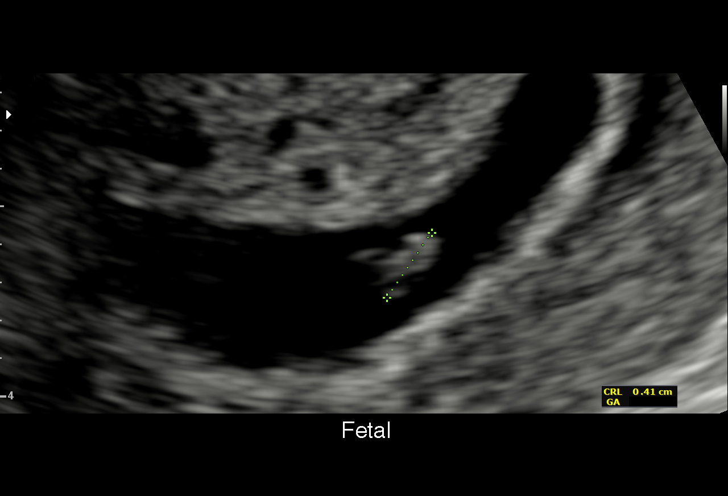
[im 29/37]
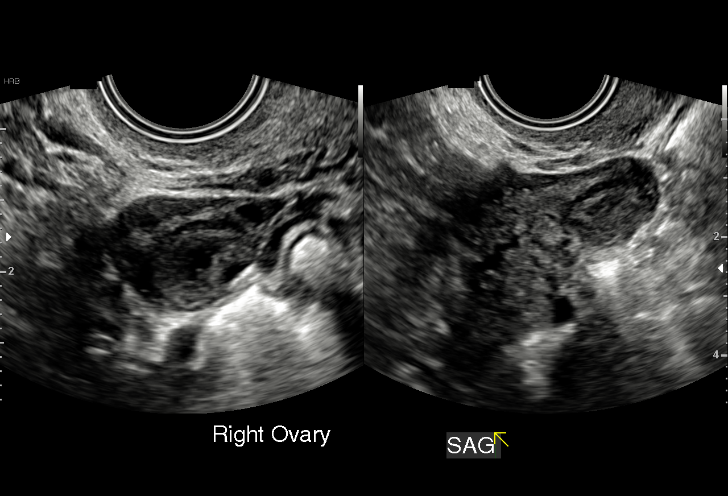
[im 31/37]
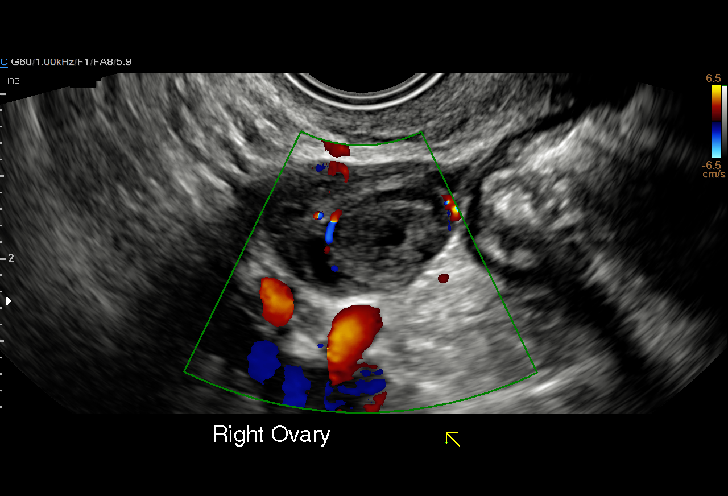
[im 34/37]
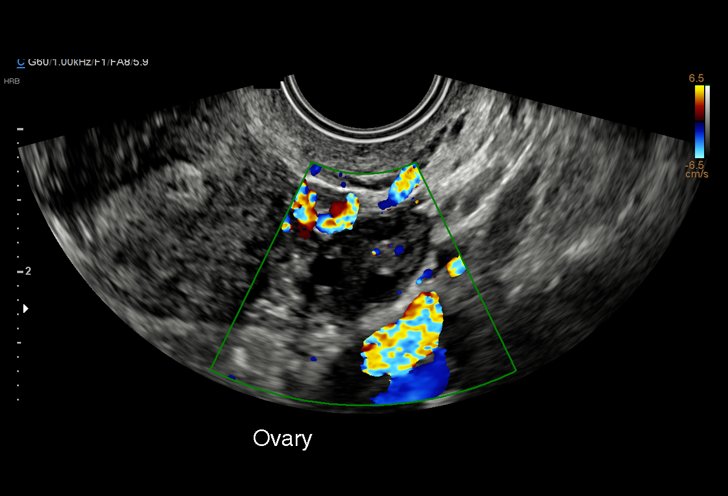
[im 37/37]
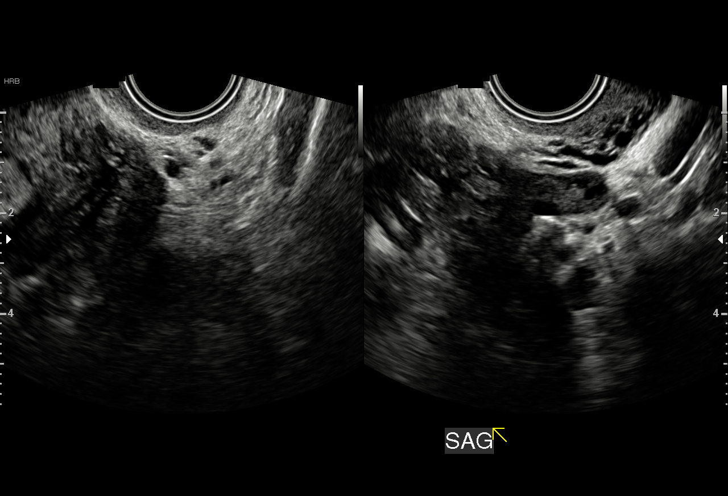

[15 of 28 positions shown; findings below may reference images not displayed]

FINDINGS: Intrauterine gestational sac: Single; irregular shape

Yolk sac:  Visualized.

Embryo:  Visualized.

Cardiac Activity: Not Visualized.

CRL:   5 mm   6 w 1 d                  US EDC: 07/27/2018

Subchorionic hemorrhage:  Tiny subchorionic hemorrhage.

Maternal uterus/adnexae: Normal appearance of both ovaries. No mass
or abnormal free fluid identified.
IMPRESSION: Findings meet definitive criteria for failed pregnancy. This follows
SRU consensus guidelines: Diagnostic Criteria for Nonviable
Pregnancy Early in the First Trimester. N Engl J Med

## 2019-12-23 NOTE — Progress Notes (Signed)
Chart reviewed for nurse visit. Agree with plan of care.   Cornelius Marullo Lorraine, CNM 12/23/2019 7:50 AM   
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
## 2020-01-31 ENCOUNTER — Other Ambulatory Visit: Payer: Self-pay

## 2020-01-31 ENCOUNTER — Ambulatory Visit (HOSPITAL_COMMUNITY)
Admission: EM | Admit: 2020-01-31 | Discharge: 2020-01-31 | Disposition: A | Discharge status: Home / Self Care | Payer: Medicaid Other | Attending: Family Medicine | Admitting: Family Medicine

## 2020-01-31 ENCOUNTER — Encounter (HOSPITAL_COMMUNITY): Payer: Self-pay

## 2020-01-31 DIAGNOSIS — L309 Dermatitis, unspecified: Secondary | ICD-10-CM | POA: Diagnosis not present

## 2020-01-31 MED ORDER — FLUOCINONIDE-E 0.05 % EX CREA: 1.0000 "application " | TOPICAL_CREAM | Freq: Two times a day (BID) | CUTANEOUS | 0 refills | Status: DC

## 2020-01-31 MED ORDER — TRIAMCINOLONE 0.1 % CREAM:EUCERIN CREAM 1:1: 1.0000 "application " | TOPICAL_CREAM | Freq: Two times a day (BID) | CUTANEOUS | 0 refills | Status: DC

## 2020-01-31 MED ORDER — METHYLPREDNISOLONE SODIUM SUCC 125 MG IJ SOLR
INTRAMUSCULAR | Status: AC
  Filled 2020-01-31: qty 2

## 2020-01-31 MED ORDER — METHYLPREDNISOLONE SODIUM SUCC 125 MG IJ SOLR
80.0000 mg | Freq: Once | INTRAMUSCULAR | Status: AC
  Administered 2020-01-31: 80 mg via INTRAMUSCULAR

## 2020-01-31 MED ORDER — PREDNISONE 10 MG (21) PO TBPK: ORAL_TABLET | Freq: Every day | ORAL | 0 refills | Status: DC

## 2020-01-31 NOTE — ED Triage Notes (Signed)
C/o eczema flare up. Hands are dry and cracking.

## 2020-01-31 NOTE — Discharge Instructions (Addendum)
SEE YOUR PCP FOR FOLLOW UP USE THE TUB OF CREAM ON YOUR BODY ECZEMA USE THE LIDEX (FLUOCINONIDE) ON THE HANDS TAKE THE PREDNISONE AS DIRECTED, START TOMORROW

## 2020-01-31 NOTE — ED Provider Notes (Signed)
[facility]    CSN: [id number] Arrival date & time: 01/31/20  0850      History   Chief Complaint Chief Complaint  Patient presents with  . Eczema    HPI Vanessa Braun is a 39 y.o. female.   HPI  Patient has known eczema.  It flares periodically.  She has is on her skin around her neck and on elbow area.  She also has severe eczema on her hands.  They crack and peel and hurt.  They are currently very flared up.  She has a 64-1/2-year-old and a 103-monthold baby at home and she is having trouble cleaning them and changing their diapers. She would like a refill of the cream she had last time.  Review of the medical record shows that she was given Lidex years ago I discussed with the patient that Lidex is okay to use on her hands for a week or 2 at a time when they are severe.  I told her Lidex's note good for long-term use and Lidex is not good to use on other parts of her skin I gave her triamcinolone and Eucerin to use on the rest of her body for the rash Because she has a flareup right now I am going to give her oral steroids in addition.  This is at her specific request, in her experience it has helped her get better faster  Past Medical History:  Diagnosis Date  . Abnormal Pap smear   . Chronic dermatitis of hands   . Complication of anesthesia    trouble waking up  . Eczema   . GERD (gastroesophageal reflux disease)    lost weight with bypass surgery, no problems since  . Hypertension   . PCOS (polycystic ovarian syndrome)     Patient Active Problem List   Diagnosis Date Noted  . 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 AMA (advanced maternal age) multigravida 357+ first trimester 06/07/2019  . Supervision of high risk pregnancy, antepartum 05/24/2019  . Vitamin D deficiency 07/25/2018  . History of cesarean delivery 07/21/2018  . Chronic hypertension during pregnancy, antepartum  07/21/2018  . Previous gastric bypass affecting pregnancy, antepartum 07/21/2018  . 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 Eczema of both hands 08/16/2014  . Obesity 02/11/2012    Past Surgical History:  Procedure Laterality Date  . ADENOIDECTOMY    . CESAREAN SECTION N/A 01/30/2019   Procedure: CESAREAN SECTION;  Surgeon: NCaren Macadam MD;  Location: [facility];  Service: Obstetrics;  Laterality: N/A;  . DILATION AND CURETTAGE OF UTERUS    . DILATION AND EVACUATION N/A 04/01/2014   Procedure: DILATATION AND EVACUATION;  Surgeon: VElveria Royals MD;  [facility];  Service: Gynecology;  Laterality: N/A;  . DILATION AND EVACUATION N/A 12/23/2017   Procedure: DILATATION AND EVACUATION;  Surgeon: HLavonia Drafts MD;  [facility];  Service: Gynecology;  Laterality: N/A;  . GASTRIC BYPASS    . TONSILLECTOMY AND ADENOIDECTOMY  1999    OB History    Gravida  5   Para  2   Term  1   Preterm  1   AB  3   Living  2     SAB  3   TAB  0   Ectopic  0   Multiple  0   Live Births  2  Home Medications    Prior to Admission medications   Medication Sig Start Date End Date Taking? Authorizing Provider  Blood Pressure Monitoring (BLOOD PRESSURE KIT) DEVI 1 Device by Does not apply route See admin instructions. Blood pressure cuff 05/24/19   Aletha Halim, MD  ferrous sulfate 325 (65 FE) MG tablet Take 325 mg by mouth 3 (three) times daily with meals.    [provider]  fluocinonide-emollient (LIDEX-E) 0.05 % cream Apply 1 application topically 2 (two) times daily. FOR HANDS 01/31/20   Raylene Everts, MD  folic acid (FOLVITE) 1 MG tablet Take 1 tablet (1 mg total) by mouth daily. 07/05/19   Aletha Halim, MD  predniSONE (STERAPRED UNI-PAK 21 TAB) 10 MG (21) TBPK tablet Take by mouth daily. Take 6 tabs by mouth daily  for 2 days, then 5 tabs for 2 days, then 4 tabs for 2 days, then 3 tabs for 2 days, 2 tabs for 2 days, then  1 tab by mouth daily for 2 days 01/31/20   Raylene Everts, MD  Prenatal Vit-Fe Fumarate-FA (PREPLUS) 27-1 MG TABS Take 1 tablet by mouth daily. 01/03/19   Chancy Milroy, MD  Triamcinolone Acetonide (TRIAMCINOLONE 0.1 % CREAM : EUCERIN) CREA Apply 1 application topically 2 (two) times daily. FOR BODY 01/31/20   Raylene Everts, MD    Family History Family History  Problem Relation Age of Onset  . Diabetes Mother   . Hyperlipidemia Mother   . Hypertension Mother   . Diabetes Father   . Kidney disease Father   . Hypertension Father   . Hyperlipidemia Father   . Cancer Father        pancreatic  . Gestational diabetes Sister   . Depression Sister   . Diabetes Sister   . Heart disease Maternal Grandmother   . Heart disease Paternal Grandmother     Social History Social History   Tobacco Use  . Smoking status: Never Smoker  . Smokeless tobacco: Never Used  Substance Use Topics  . Alcohol use: Not Currently  . Drug use: No     Allergies   Aspirin, Other, and Penicillins   Review of Systems Review of Systems  Skin: Positive for rash.     Physical Exam Triage Vital Signs ED Triage Vitals  Enc Vitals Group     BP 01/31/20 0909 128/79     Pulse Rate 01/31/20 0909 (!) 52     Resp 01/31/20 0909 16     Temp 01/31/20 0909 (!) 97.5 F (36.4 C)     Temp Source 01/31/20 0909 Oral     SpO2 01/31/20 0909 100 %     Weight --      Height --      Head Circumference --      Peak Flow --      Pain Score 01/31/20 0908 10     Pain Loc --      Pain Edu? --      Excl. in [place]? --    No data found.  Updated Vital Signs BP 128/79 (BP Location: Right Arm)   Pulse (!) 52   Temp (!) 97.5 F (36.4 C) (Oral)   Resp 16   LMP 03/14/2019 (Within Days)   SpO2 100%     Physical Exam Constitutional:      General: She is not in acute distress.    Appearance: She is well-developed.     Comments: Exam by observation.  Mask in place  HENT:     Head: Normocephalic and  atraumatic.  Eyes:     Conjunctiva/sclera: Conjunctivae normal.     Pupils: Pupils are equal, round, and reactive to light.  Cardiovascular:     Rate and Rhythm: Normal rate.  Pulmonary:     Effort: Pulmonary effort is normal. No respiratory distress.  Abdominal:     General: There is no distension.     Palpations: Abdomen is soft.  Musculoskeletal:        General: Normal range of motion.     Cervical back: Normal range of motion.  Skin:    General: Skin is warm and dry.     Comments: Patient has an erythematous vesicular rash across the back of her neck and in both antecubital fossa that is inflamed.  Her hands have cracking, peeling, thick exfoliation around the sides especially index and thumb finger  Neurological:     Mental Status: She is alert.      UC Treatments / Results  Labs (all labs ordered are listed, but only abnormal results are displayed) Labs Reviewed - No data to display  EKG   Radiology No results found.  Procedures Procedures (including critical care time)  Medications Ordered in UC Medications  methylPREDNISolone sodium succinate (SOLU-MEDROL) 125 mg/2 mL injection 80 mg (80 mg Intramuscular Given 01/31/20 0945)    Initial Impression / Assessment and Plan / UC Course  I have reviewed the triage vital signs and the nursing notes.  Pertinent labs & imaging results that were available during my care of the patient were reviewed by me and considered in my medical decision making (see chart for details).     Treatment of eczema is discussed with patient Final Clinical Impressions(s) / UC Diagnoses   Final diagnoses:  Eczema of both hands     Discharge Instructions     SEE YOUR PCP FOR FOLLOW UP USE THE TUB OF CREAM ON YOUR BODY ECZEMA USE THE LIDEX (FLUOCINONIDE) ON THE HANDS TAKE THE PREDNISONE AS DIRECTED, START TOMORROW   ED Prescriptions    Medication Sig Dispense Auth. Provider   predniSONE (STERAPRED UNI-PAK 21 TAB) 10 MG (21)  TBPK tablet Take by mouth daily. Take 6 tabs by mouth daily  for 2 days, then 5 tabs for 2 days, then 4 tabs for 2 days, then 3 tabs for 2 days, 2 tabs for 2 days, then 1 tab by mouth daily for 2 days 42 tablet Raylene Everts, MD   fluocinonide-emollient (LIDEX-E) 0.05 % cream Apply 1 application topically 2 (two) times daily. FOR HANDS 30 g Raylene Everts, MD   Triamcinolone Acetonide (TRIAMCINOLONE 0.1 % CREAM : EUCERIN) CREA Apply 1 application topically 2 (two) times daily. FOR BODY 1 each Raylene Everts, MD     PDMP not reviewed this encounter.   Raylene Everts, MD 01/31/20 1125

## 2020-06-23 ENCOUNTER — Emergency Department (HOSPITAL_COMMUNITY)
Admission: EM | Admit: 2020-06-23 | Discharge: 2020-06-23 | Disposition: A | Discharge status: Home / Self Care | Payer: Medicaid Other | Attending: Emergency Medicine | Admitting: Emergency Medicine

## 2020-06-23 ENCOUNTER — Other Ambulatory Visit: Payer: Self-pay

## 2020-06-23 ENCOUNTER — Encounter (HOSPITAL_COMMUNITY): Payer: Self-pay | Admitting: Emergency Medicine

## 2020-06-23 DIAGNOSIS — I10 Essential (primary) hypertension: Secondary | ICD-10-CM | POA: Diagnosis not present

## 2020-06-23 DIAGNOSIS — M79641 Pain in right hand: Secondary | ICD-10-CM | POA: Diagnosis not present

## 2020-06-23 DIAGNOSIS — M79642 Pain in left hand: Secondary | ICD-10-CM | POA: Insufficient documentation

## 2020-06-23 DIAGNOSIS — L309 Dermatitis, unspecified: Secondary | ICD-10-CM | POA: Insufficient documentation

## 2020-06-23 DIAGNOSIS — Z79899 Other long term (current) drug therapy: Secondary | ICD-10-CM | POA: Insufficient documentation

## 2020-06-23 MED ORDER — METHYLPREDNISOLONE 4 MG PO TBPK: 4.0000 mg | ORAL_TABLET | Freq: Three times a day (TID) | ORAL | Status: DC

## 2020-06-23 MED ORDER — METHYLPREDNISOLONE 4 MG PO TBPK: 8.0000 mg | ORAL_TABLET | Freq: Every evening | ORAL | Status: DC

## 2020-06-23 MED ORDER — CLOBETASOL PROPIONATE 0.05 % EX CREA: TOPICAL_CREAM | Freq: Two times a day (BID) | CUTANEOUS | Status: DC

## 2020-06-23 MED ORDER — METHYLPREDNISOLONE 4 MG PO TBPK: ORAL_TABLET | ORAL | 0 refills | Status: DC

## 2020-06-23 MED ORDER — METHYLPREDNISOLONE 4 MG PO TBPK: 8.0000 mg | ORAL_TABLET | Freq: Every morning | ORAL | Status: DC

## 2020-06-23 MED ORDER — METHYLPREDNISOLONE 4 MG PO TBPK: 4.0000 mg | ORAL_TABLET | ORAL | Status: DC

## 2020-06-23 MED ORDER — METHYLPREDNISOLONE 4 MG PO TBPK: 4.0000 mg | ORAL_TABLET | Freq: Four times a day (QID) | ORAL | Status: DC

## 2020-06-23 MED ORDER — CLOBETASOL PROP EMOLLIENT BASE 0.05 % EX CREA: 1.0000 "application " | TOPICAL_CREAM | Freq: Two times a day (BID) | CUTANEOUS | 0 refills | Status: DC

## 2020-06-23 NOTE — ED Provider Notes (Signed)
[facility] EMERGENCY DEPARTMENT Provider Note   CSN: [id number] Arrival date & time: 06/23/20  1715     History Chief Complaint  Patient presents with  . Eczema    Vanessa Braun is a 39 y.o. female with past medical history significant for eczema presents the ED for pain to bilateral hands consistent with eczema flareup per her.  She states that this used to happen relatively frequently for which she was seeing a dermatologist.  She states that she was typically prescribed a prednisone taper and clobetasol with improvement.  She states that she has had a couple of recent pregnancies and has since been lost to follow-up with dermatology.  States of the last few days recommend her hands is significantly worsened to the point where she has trouble using them, endorses some bleeding to her fingertips and cuticles.  No fevers.  Some scattered involvement to the chest, neck, arms.  The history is provided by the patient.  Illness Quality:  Hand pain Severity:  Moderate Onset quality:  Gradual Duration:  5 days Timing:  Constant Progression:  Worsening Chronicity:  New Context:  Known eczema Associated symptoms: rash   Associated symptoms: no fever and no headaches        Past Medical History:  Diagnosis Date  . Abnormal Pap smear   . Chronic dermatitis of hands   . Complication of anesthesia    trouble waking up  . Eczema   . GERD (gastroesophageal reflux disease)    lost weight with bypass surgery, no problems since  . Hypertension   . PCOS (polycystic ovarian syndrome)     Patient Active Problem List   Diagnosis Date Noted  . 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 AMA (advanced maternal age) multigravida 110+, first trimester 06/07/2019  . Supervision of high risk pregnancy, antepartum 05/24/2019  . Vitamin D deficiency 07/25/2018  . History of cesarean delivery  07/21/2018  . Chronic hypertension during pregnancy, antepartum 07/21/2018  . Previous gastric bypass affecting pregnancy, antepartum 07/21/2018  . 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 Eczema of both hands 08/16/2014  . Obesity 02/11/2012    Past Surgical History:  Procedure Laterality Date  . ADENOIDECTOMY    . CESAREAN SECTION N/A 01/30/2019   Procedure: CESAREAN SECTION;  Surgeon: Caren Macadam, MD;  Location: [facility];  Service: Obstetrics;  Laterality: N/A;  . DILATION AND CURETTAGE OF UTERUS    . DILATION AND EVACUATION N/A 04/01/2014   Procedure: DILATATION AND EVACUATION;  Surgeon: Elveria Royals, MD;  Location: [facility];  Service: Gynecology;  Laterality: N/A;  . DILATION AND EVACUATION N/A 12/23/2017   Procedure: DILATATION AND EVACUATION;  Surgeon: Lavonia Drafts, MD;  Location: [facility];  Service: Gynecology;  Laterality: N/A;  . GASTRIC BYPASS    . TONSILLECTOMY AND ADENOIDECTOMY  1999     OB History    Gravida  5   Para  2   Term  1   Preterm  1   AB  3   Living  2     SAB  3   TAB  0   Ectopic  0   Multiple  0   Live Births  2           Family History  Problem Relation Age of Onset  . Diabetes Mother   . Hyperlipidemia Mother   . Hypertension Mother   .  Diabetes Father   . Kidney disease Father   . Hypertension Father   . Hyperlipidemia Father   . Cancer Father        pancreatic  . Gestational diabetes Sister   . Depression Sister   . Diabetes Sister   . Heart disease Maternal Grandmother   . Heart disease Paternal Grandmother     Social History   Tobacco Use  . Smoking status: Never Smoker  . Smokeless tobacco: Never Used  Vaping Use  . Vaping Use: Never used  Substance Use Topics  . Alcohol use: Not Currently  . Drug use: No    Home Medications Prior to Admission medications   Medication Sig Start Date End Date Taking? Authorizing Provider  Blood Pressure Monitoring (BLOOD PRESSURE KIT) DEVI 1  Device by Does not apply route See admin instructions. Blood pressure cuff 05/24/19   Aletha Halim, MD  Clobetasol Prop Emollient Base (CLOBETASOL PROPIONATE E) 0.05 % emollient cream Apply 1 application topically 2 (two) times daily. Please avoid contact with face. 06/23/20   Renold Genta, MD  ferrous sulfate 325 (65 FE) MG tablet Take 325 mg by mouth 3 (three) times daily with meals.    [provider]  fluocinonide-emollient (LIDEX-E) 0.05 % cream Apply 1 application topically 2 (two) times daily. FOR HANDS 01/31/20   Raylene Everts, MD  folic acid (FOLVITE) 1 MG tablet Take 1 tablet (1 mg total) by mouth daily. 07/05/19   Aletha Halim, MD  methylPREDNISolone (MEDROL DOSEPAK) 4 MG TBPK tablet Please use as directed on packaging 06/23/20   Renold Genta, MD  predniSONE (STERAPRED UNI-PAK 21 TAB) 10 MG (21) TBPK tablet Take by mouth daily. Take 6 tabs by mouth daily  for 2 days, then 5 tabs for 2 days, then 4 tabs for 2 days, then 3 tabs for 2 days, 2 tabs for 2 days, then 1 tab by mouth daily for 2 days 01/31/20   Raylene Everts, MD  Prenatal Vit-Fe Fumarate-FA (PREPLUS) 27-1 MG TABS Take 1 tablet by mouth daily. 01/03/19   Chancy Milroy, MD  Triamcinolone Acetonide (TRIAMCINOLONE 0.1 % CREAM : EUCERIN) CREA Apply 1 application topically 2 (two) times daily. FOR BODY 01/31/20   Raylene Everts, MD    Allergies    Aspirin, Other, and Penicillins  Review of Systems   Review of Systems  Constitutional: Negative for chills and fever.  HENT: Negative for facial swelling and voice change.   Genitourinary: Negative for difficulty urinating and dysuria.  Musculoskeletal: Negative for gait problem and joint swelling.       Positive for bilateral hand pain  Skin: Positive for rash. Negative for wound.  Neurological: Negative for dizziness and headaches.    Physical Exam Updated Vital Signs BP (!) 137/98 (BP Location: Right Arm)   Pulse 69   Temp 97.9 F (36.6 C)  (Oral)   Resp 14   Ht _0  (1.753 m)   Wt 118.8 kg   LMP 03/14/2019 (Within Days)   SpO2 100%   BMI 38.69 kg/m   Physical Exam Constitutional:      General: She is not in acute distress. HENT:     Head: Normocephalic and atraumatic.     Mouth/Throat:     Mouth: Mucous membranes are moist.     Pharynx: Oropharynx is clear.  Cardiovascular:     Rate and Rhythm: Normal rate and regular rhythm.     Pulses: Normal pulses.  Pulmonary:  Effort: Pulmonary effort is normal. No respiratory distress.  Musculoskeletal:        General: No tenderness or deformity.     Cervical back: Normal range of motion and neck supple.  Skin:    General: Skin is dry.     Comments: Dry skin with eczematous changes to bilateral hands, distal greater than proximal, scattered eczematous changes to the forearms, chest of much milder degree.  Neurological:     General: No focal deficit present.     Mental Status: She is alert and oriented to person, place, and time.     ED Results / Procedures / Treatments   Labs (all labs ordered are listed, but only abnormal results are displayed) Labs Reviewed - No data to display  EKG None  Radiology No results found.  Procedures Procedures (including critical care time)  Medications Ordered in ED Medications - No data to display  ED Course  I have reviewed the triage vital signs and the nursing notes.  Pertinent labs & imaging results that were available during my care of the patient were reviewed by me and considered in my medical decision making (see chart for details).    MDM Rules/Calculators/A&P                          Differential diagnosis considered: Eczema, scabies, staph coccal or herpetic superinfection  Patient presents with typical relatively severe eczematous changes to bilateral hands, with scattered involvement of the rest of her body.  Patient states that this is a recurring problem for her which in the past has typically  responded well to clobetasol and a prednisone taper.  She would like to get reestablished with dermatology.  Patient prescribed clobetasol cream, counseled to avoid the face, will prescribe Medrol Dosepak as well.  Patient provided with contact information for dermatology here.   Final Clinical Impression(s) / ED Diagnoses Final diagnoses:  Eczema, unspecified type    Rx / DC Orders ED Discharge Orders         Ordered    Clobetasol Prop Emollient Base (CLOBETASOL PROPIONATE E) 0.05 % emollient cream  2 times daily        06/23/20 2003    methylPREDNISolone (MEDROL DOSEPAK) 4 MG TBPK tablet        06/23/20 2003         Labs, studies and imaging reviewed by myself and considered in medical decision making if ordered. Imaging interpreted by radiology. Pt was discussed with my attending, Dr. Vanita Panda  Electronically signed by:  Griselda Bramblett [place]8/30/20218:16 PM      Renold Genta, MD 06/23/20 2016    Carmin Muskrat, MD 06/24/20 [phone]

## 2020-06-23 NOTE — ED Triage Notes (Signed)
Pt. Has eczema ob hands so bad I can't even use them. This started 3 weeks and got worse. The tips are bleeding

## 2020-12-07 ENCOUNTER — Emergency Department (HOSPITAL_COMMUNITY)
Admission: EM | Admit: 2020-12-07 | Discharge: 2020-12-08 | Disposition: A | Discharge status: Home / Self Care | Payer: Medicaid Other | Attending: Emergency Medicine | Admitting: Emergency Medicine

## 2020-12-07 ENCOUNTER — Other Ambulatory Visit: Payer: Self-pay

## 2020-12-07 DIAGNOSIS — N83201 Unspecified ovarian cyst, right side: Secondary | ICD-10-CM | POA: Diagnosis not present

## 2020-12-07 DIAGNOSIS — I10 Essential (primary) hypertension: Secondary | ICD-10-CM | POA: Insufficient documentation

## 2020-12-07 DIAGNOSIS — K821 Hydrops of gallbladder: Secondary | ICD-10-CM | POA: Diagnosis not present

## 2020-12-07 DIAGNOSIS — R52 Pain, unspecified: Secondary | ICD-10-CM

## 2020-12-07 DIAGNOSIS — R1011 Right upper quadrant pain: Secondary | ICD-10-CM | POA: Diagnosis present

## 2020-12-07 DIAGNOSIS — K219 Gastro-esophageal reflux disease without esophagitis: Secondary | ICD-10-CM | POA: Insufficient documentation

## 2020-12-07 LAB — COMPREHENSIVE METABOLIC PANEL
ALT: 16 U/L (ref 0–44)
AST: 23 U/L (ref 15–41)
Albumin: 3.7 g/dL (ref 3.5–5.0)
Alkaline Phosphatase: 74 U/L (ref 38–126)
Anion gap: 8 (ref 5–15)
BUN: 16 mg/dL (ref 6–20)
CO2: 26 mmol/L (ref 22–32)
Calcium: 9.1 mg/dL (ref 8.9–10.3)
Chloride: 104 mmol/L (ref 98–111)
Creatinine, Ser: 0.91 mg/dL (ref 0.44–1.00)
GFR, Estimated: >60 mL/min (ref >60)
Glucose, Bld: 96 mg/dL (ref 70–99)
Potassium: 4.3 mmol/L (ref 3.5–5.1)
Sodium: 138 mmol/L (ref 135–145)
Total Bilirubin: 0.5 mg/dL (ref 0.3–1.2)
Total Protein: 7.6 g/dL (ref 6.5–8.1)

## 2020-12-07 LAB — CBC
HCT: 39.4 % (ref 36.0–46.0)
Hemoglobin: 13.2 g/dL (ref 12.0–15.0)
MCH: 30.4 pg (ref 26.0–34.0)
MCHC: 33.5 g/dL (ref 30.0–36.0)
MCV: 90.8 fL (ref 80.0–100.0)
Platelets: 247 10*3/uL (ref 150–400)
RBC: 4.34 MIL/uL (ref 3.87–5.11)
RDW: 13.1 % (ref 11.5–15.5)
WBC: 10 10*3/uL (ref 4.0–10.5)
nRBC: 0 % (ref 0.0–0.2)

## 2020-12-07 LAB — LIPASE, BLOOD: Lipase: 36 U/L (ref 11–51)

## 2020-12-07 LAB — I-STAT BETA HCG BLOOD, ED (MC, WL, AP ONLY): I-stat hCG, quantitative: <5 m[IU]/mL (ref <5)

## 2020-12-07 MED ORDER — ONDANSETRON 4 MG PO TBDP
8.0000 mg | ORAL_TABLET | Freq: Once | ORAL | Status: AC
  Administered 2020-12-07: 8 mg via ORAL
  Filled 2020-12-07: qty 2

## 2020-12-07 MED ORDER — HYDROCODONE-ACETAMINOPHEN 5-325 MG PO TABS
1.0000 | ORAL_TABLET | Freq: Once | ORAL | Status: AC
  Administered 2020-12-07: 1 via ORAL
  Filled 2020-12-07: qty 1

## 2020-12-07 NOTE — ED Triage Notes (Signed)
Pt presents to ED POV. Pt c/o RLQ abd pain that radiates towards back. Pt reports that it began suddenly this evening. denies any GI/GU s/s.

## 2020-12-08 ENCOUNTER — Emergency Department (HOSPITAL_COMMUNITY): Payer: Medicaid Other

## 2020-12-08 LAB — URINALYSIS, ROUTINE W REFLEX MICROSCOPIC
Bilirubin Urine: NEGATIVE
Glucose, UA: NEGATIVE mg/dL
Hgb urine dipstick: NEGATIVE
Ketones, ur: NEGATIVE mg/dL
Leukocytes,Ua: NEGATIVE
Nitrite: NEGATIVE
Protein, ur: NEGATIVE mg/dL
Specific Gravity, Urine: 1.02 (ref 1.005–1.030)
pH: 7 (ref 5.0–8.0)

## 2020-12-08 MED ORDER — IOHEXOL 300 MG/ML  SOLN
100.0000 mL | Freq: Once | INTRAMUSCULAR | Status: AC | PRN
  Administered 2020-12-08: 100 mL via INTRAVENOUS

## 2020-12-08 MED ORDER — IOHEXOL 9 MG/ML PO SOLN
ORAL | Status: AC
  Administered 2020-12-08: 1000 mL via ORAL
  Filled 2020-12-08: qty 1000

## 2020-12-08 NOTE — ED Notes (Signed)
Patient transported to CT scan . 

## 2020-12-08 NOTE — ED Provider Notes (Signed)
[facility] EMERGENCY DEPARTMENT Provider Note   CSN: [id number] Arrival date & time: 12/07/20  2144     History Chief Complaint  Patient presents with  . Abdominal Pain    Vanessa Braun is a 40 y.o. female.  The history is provided by the patient.  Abdominal Pain Pain location:  RUQ Pain quality: sharp   Pain radiates to:  Back Pain severity:  Moderate Onset quality:  Gradual Duration:  8 hours Timing:  Intermittent Progression:  Worsening Chronicity:  New Relieved by:  Nothing Worsened by:  Movement and palpation Associated symptoms: no chest pain, no diarrhea, no dysuria, no fever, no shortness of breath and no vomiting   Patient reports sudden onset of right upper quadrant abdominal pain.  This occurred at rest.  At times it does radiate to the back.  It feels sharp and is intermittent     Past Medical History:  Diagnosis Date  . Abnormal Pap smear   . Chronic dermatitis of hands   . Complication of anesthesia    trouble waking up  . Eczema   . GERD (gastroesophageal reflux disease)    lost weight with bypass surgery, no problems since  . Hypertension   . PCOS (polycystic ovarian syndrome)     Patient Active Problem List   Diagnosis Date Noted  . 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 AMA (advanced maternal age) multigravida 24+, first trimester 06/07/2019  . Supervision of high risk pregnancy, antepartum 05/24/2019  . Vitamin D deficiency 07/25/2018  . History of cesarean delivery 07/21/2018  . Chronic hypertension during pregnancy, antepartum 07/21/2018  . Previous gastric bypass affecting pregnancy, antepartum 07/21/2018  . 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 Eczema of both hands 08/16/2014  . Obesity 02/11/2012    Past Surgical History:  Procedure Laterality Date  . ADENOIDECTOMY    . CESAREAN SECTION N/A 01/30/2019   Procedure: CESAREAN  SECTION;  Surgeon: Caren Macadam, MD;  Location: [facility];  Service: Obstetrics;  Laterality: N/A;  . DILATION AND CURETTAGE OF UTERUS    . DILATION AND EVACUATION N/A 04/01/2014   Procedure: DILATATION AND EVACUATION;  Surgeon: Elveria Royals, MD;  Location: [facility];  Service: Gynecology;  Laterality: N/A;  . DILATION AND EVACUATION N/A 12/23/2017   Procedure: DILATATION AND EVACUATION;  Surgeon: Lavonia Drafts, MD;  Location: [facility];  Service: Gynecology;  Laterality: N/A;  . GASTRIC BYPASS    . TONSILLECTOMY AND ADENOIDECTOMY  1999     OB History    Gravida  5   Para  2   Term  1   Preterm  1   AB  3   Living  2     SAB  3   IAB  0   Ectopic  0   Multiple  0   Live Births  2           Family History  Problem Relation Age of Onset  . Diabetes Mother   . Hyperlipidemia Mother   . Hypertension Mother   . Diabetes Father   . Kidney disease Father   . Hypertension Father   . Hyperlipidemia Father   . Cancer Father        pancreatic  . Gestational diabetes Sister   . Depression Sister   . Diabetes Sister   . Heart disease Maternal Grandmother   . Heart disease Paternal Grandmother  Social History   Tobacco Use  . Smoking status: Never Smoker  . Smokeless tobacco: Never Used  Vaping Use  . Vaping Use: Never used  Substance Use Topics  . Alcohol use: Not Currently  . Drug use: No    Home Medications Prior to Admission medications   Medication Sig Start Date End Date Taking? Authorizing Provider  Blood Pressure Monitoring (BLOOD PRESSURE KIT) DEVI 1 Device by Does not apply route See admin instructions. Blood pressure cuff 05/24/19   Aletha Halim, MD  Clobetasol Prop Emollient Base (CLOBETASOL PROPIONATE E) 0.05 % emollient cream Apply 1 application topically 2 (two) times daily. Please avoid contact with face. 06/23/20   Renold Genta, MD  ferrous sulfate 325 (65 FE) MG tablet Take 325 mg by mouth 3 (three) times daily with  meals.    [provider]  fluocinonide-emollient (LIDEX-E) 0.05 % cream Apply 1 application topically 2 (two) times daily. FOR HANDS 01/31/20   Raylene Everts, MD  folic acid (FOLVITE) 1 MG tablet Take 1 tablet (1 mg total) by mouth daily. 07/05/19   Aletha Halim, MD  methylPREDNISolone (MEDROL DOSEPAK) 4 MG TBPK tablet Please use as directed on packaging 06/23/20   Renold Genta, MD  predniSONE (STERAPRED UNI-PAK 21 TAB) 10 MG (21) TBPK tablet Take by mouth daily. Take 6 tabs by mouth daily  for 2 days, then 5 tabs for 2 days, then 4 tabs for 2 days, then 3 tabs for 2 days, 2 tabs for 2 days, then 1 tab by mouth daily for 2 days 01/31/20   Raylene Everts, MD  Prenatal Vit-Fe Fumarate-FA (PREPLUS) 27-1 MG TABS Take 1 tablet by mouth daily. 01/03/19   Chancy Milroy, MD  Triamcinolone Acetonide (TRIAMCINOLONE 0.1 % CREAM : EUCERIN) CREA Apply 1 application topically 2 (two) times daily. FOR BODY 01/31/20   Raylene Everts, MD    Allergies    Aspirin, Other, and Penicillins  Review of Systems   Review of Systems  Constitutional: Negative for fever.  Respiratory: Negative for shortness of breath.   Cardiovascular: Negative for chest pain.  Gastrointestinal: Positive for abdominal pain. Negative for diarrhea and vomiting.  Genitourinary: Negative for dysuria.  All other systems reviewed and are negative.   Physical Exam Updated Vital Signs BP 128/75   Pulse 60   Temp 98.4 F (36.9 C) (Oral)   Resp 11   LMP 03/14/2019 (Within Days)   SpO2 97%   Physical Exam CONSTITUTIONAL: Well developed/well nourished HEAD: Normocephalic/atraumatic EYES: EOMI/PERRL, no icterus ENMT: Mucous membranes moist NECK: supple no meningeal signs SPINE/BACK:entire spine nontender CV: S1/S2 noted, no murmurs/rubs/gallops noted LUNGS: Lungs are clear to auscultation bilaterally, no apparent distress ABDOMEN: soft, moderate RUQ tenderness., no rebound or guarding, bowel sounds noted  throughout abdomen GU:no cva tenderness NEURO: Pt is awake/alert/appropriate, moves all extremitiesx4.  No facial droop.   EXTREMITIES: pulses normal/equal, full ROM SKIN: warm, color normal PSYCH: no abnormalities of mood noted, alert and oriented to situation  ED Results / Procedures / Treatments   Labs (all labs ordered are listed, but only abnormal results are displayed) Labs Reviewed  LIPASE, BLOOD  COMPREHENSIVE METABOLIC PANEL  CBC  URINALYSIS, ROUTINE W REFLEX MICROSCOPIC  I-STAT BETA HCG BLOOD, ED ([facility], [facility], [facility] ONLY)    EKG None  Radiology CT ABDOMEN PELVIS W CONTRAST  Result Date: 12/08/2020 CLINICAL DATA:  Abdominal pain, nonlocalized. Evaluate for intussusception. EXAM: CT ABDOMEN AND PELVIS WITH CONTRAST TECHNIQUE: Multidetector CT imaging of the  abdomen and pelvis was performed using the standard protocol following bolus administration of intravenous contrast. CONTRAST:  148m OMNIPAQUE IOHEXOL 300 MG/ML  SOLN COMPARISON:  Noncontrast CT earlier today. Enhanced abdominal CT March 2016 FINDINGS: Lower chest:  No contributory findings. Hepatobiliary: No focal liver abnormality.No evidence of biliary obstruction or stone. Pancreas: Unremarkable. Spleen: Unremarkable. Adrenals/Urinary Tract: Negative adrenals. No hydronephrosis or stone. Unremarkable bladder. Stomach/Bowel: The intussusception appearance at the enteroenteric anastomosis is no longer seen. Gastric bypass without visible inflammation or obstruction. Vascular/Lymphatic: No acute vascular abnormality. No mass or adenopathy. Reproductive:Hysterectomy. 4 cm lesion in the right ovary with medial solid appearance and lateral cystic appearance. No adjacent fat stranding. Other: No ascites or pneumoperitoneum. Musculoskeletal: No acute abnormalities. IMPRESSION: 1. Negative for intussusception or obstruction. 2. 4 cm right ovarian lesion which is both solid and cystic. This could reflect a hemorrhagic cyst with retracting clot,  but recommend pelvic ultrasound in 4-6 weeks to exclude cyst with soft tissue nodule. Electronically Signed   By: JMonte FantasiaM.D.   On: 12/08/2020 05:03   CT Renal Stone Study  Result Date: 12/08/2020 CLINICAL DATA:  No right upper quadrant and flank pain. EXAM: CT ABDOMEN AND PELVIS WITHOUT CONTRAST TECHNIQUE: Multidetector CT imaging of the abdomen and pelvis was performed following the standard protocol without IV contrast. COMPARISON:  Right upper quadrant ultrasound earlier today. CT 01/12/2015 FINDINGS: Lower chest: The lung bases are clear. No focal airspace disease or pleural effusion. Hepatobiliary: No focal hepatic abnormality on noncontrast exam. Mild gallbladder distention without pericholecystic fat stranding or inflammation. There is no biliary dilatation. Pancreas: No ductal dilatation or inflammation. Spleen: Normal in size without focal abnormality. Adrenals/Urinary Tract: Normal adrenal glands. No hydronephrosis or renal calculi. No perinephric edema. Ureters are decompressed without stones along the course. Urinary bladder is completely empty. Stomach/Bowel: Gastric bypass anatomy with enteric suture line crossing the diaphragmatic hiatus. Patulous distal esophagus with small hiatal hernia. The Roux limb is nondilated. The excluded gastric remnant is decompressed. Jejunal anastomosis in the right abdomen. There may be a short segment small bowel intussusception at the jejunal anastomosis, coronal series 6, image 48 not well evaluated in the absence of enteric contrast. No associated wall thickening, inflammation, pneumatosis or obstruction. Remaining small bowel is decompressed. Small volume of colonic stool in the colon. Normal appendix. Vascular/Lymphatic: Normal caliber abdominal aorta. No portal venous or mesenteric gas. No adenopathy. Reproductive: The uterus is not well identified, may be atrophic or surgically absent. Right ovary is minimally prominent and contains a possible  hemorrhagic cyst. No adjacent fat stranding. Left ovary is tentatively visualized and quiescent. Other: No free air or free fluid. Postsurgical change of the anterior abdominal wall. No body wall hernia. Musculoskeletal: There are no acute or suspicious osseous abnormalities. IMPRESSION: 1. No renal stones or obstructive uropathy. 2. Gastric bypass anatomy with small hiatal hernia. Possible short segment small bowel intussusception at the jejunal anastomosis, not well evaluated in the absence of enteric contrast. No associated wall thickening, inflammation, or obstruction. 3. Right ovary is minimally prominent and contains a possible hemorrhagic cyst. No adjacent fat stranding. Consensus guidelines for a cyst of this size recommend no further follow-up for incidentally detected lesions. Given right-sided pain, further evaluation with pelvic ultrasound could be considered. Electronically Signed   By: MKeith RakeM.D.   On: 12/08/2020 01:42   UK[place]Abdomen Limited RUQ (LIVER/GB)  Result Date: 12/08/2020 CLINICAL DATA:  Pain, labs normal.  Ate 3.5 hours ago. EXAM: ULTRASOUND ABDOMEN LIMITED RIGHT  UPPER QUADRANT COMPARISON:  CT abdomen pelvis 01/12/2015 FINDINGS: Gallbladder: Nonspecific hydropic gallbladder. No gallstones or wall thickening visualized. Unable to evaluate for a sonographic Murphy sign as patient premedicated. Common bile duct: Diameter: 3 mm. Liver: No focal lesion identified. Within normal limits in parenchymal echogenicity. Portal vein is patent on color Doppler imaging with normal direction of blood flow towards the liver. Other: None. IMPRESSION: Nonspecific hydropic gallbladder with no findings to suggest cholelithiasis, acute cholecystitis, choledocholithiasis. Electronically Signed   By: Iven Finn M.D.   On: 12/08/2020 00:48    Procedures Procedures   Medications Ordered in ED Medications  ondansetron (ZOFRAN-ODT) disintegrating tablet 8 mg (8 mg Oral Given 12/07/20 2359)   HYDROcodone-acetaminophen (NORCO/VICODIN) 5-325 MG per tablet 1 tablet (1 tablet Oral Given 12/07/20 2359)    ED Course  I have reviewed the triage vital signs and the nursing notes.  Pertinent labs & imaging results that were available during my care of the patient were reviewed by me and considered in my medical decision making (see chart for details).    MDM Rules/Calculators/A&P                          2:09 AM Patient presented for right upper quadrant and back pain On my initial exam she had right upper quadrant tenderness.  Initial work-up focused on biliary cause.  No obvious signs of cholelithiasis or cholecystitis.  She does have questionable hydrops gallbladder Patient with some improvement but still having pain and is not immediately obvious that the gallbladder was the cause after ultrasound. CT renal was performed that did not show any signs of ureteral stone.  However she may have an area of short segment intussusception from prior gastric bypass Discussed these findings with Dr. Donne Hazel with general surgery.  He suggested to definitively rule this out in a patient with gastric bypass, she will require CT imaging with IV and oral contrast. Due to high concern for complication from previous gastric bypass, we will proceed with repeat CT scan with IV/PO contrast    Fortunately CT imaging does not reveal any signs of intussusception.  No issues with gastric bypass are noted.  She does have an ovarian cyst, recommend ultrasound next 4-6 weeks.  We will also refer to general surgery if she has continued right upper quadrant pain.  Advised to follow-up with gynecology for outpatient ultrasound.  Patient otherwise feels comfortable for discharge home Final Clinical Impression(s) / ED Diagnoses Final diagnoses:  Pain  Gallbladder hydrops  Cyst of right ovary  Right upper quadrant abdominal pain    Rx / DC Orders ED Discharge Orders    None       Ripley Fraise,  MD 12/08/20 [phone]

## 2020-12-31 ENCOUNTER — Ambulatory Visit (HOSPITAL_COMMUNITY)
Admission: EM | Admit: 2020-12-31 | Discharge: 2020-12-31 | Disposition: A | Discharge status: Home / Self Care | Payer: Medicaid Other | Attending: Emergency Medicine | Admitting: Emergency Medicine

## 2020-12-31 ENCOUNTER — Other Ambulatory Visit: Payer: Self-pay

## 2020-12-31 ENCOUNTER — Encounter (HOSPITAL_COMMUNITY): Payer: Self-pay | Admitting: Emergency Medicine

## 2020-12-31 DIAGNOSIS — L309 Dermatitis, unspecified: Secondary | ICD-10-CM | POA: Diagnosis not present

## 2020-12-31 MED ORDER — CLOBETASOL PROP EMOLLIENT BASE 0.05 % EX CREA: 1.0000 "application " | TOPICAL_CREAM | Freq: Two times a day (BID) | CUTANEOUS | 0 refills | Status: DC

## 2020-12-31 MED ORDER — METHYLPREDNISOLONE 4 MG PO TBPK: ORAL_TABLET | ORAL | 0 refills | Status: DC

## 2020-12-31 NOTE — ED Triage Notes (Signed)
Patient has a history of eczema.  Locations include bilateral hands and ears .  Patient reports prednisone does help.  Patient has been out of medicines for 3-4 months.  Patient has not had a recent outbreak

## 2020-12-31 NOTE — Discharge Instructions (Signed)
Take the Medrol Dosepak as directed.  Use the clobetasol cream as directed; Avoid contact with face.    Schedule an appointment with a dermatologist.

## 2020-12-31 NOTE — ED Provider Notes (Signed)
[place]    CSN: [id number] Arrival date & time: 12/31/20  1523      History   Chief Complaint Chief Complaint  Patient presents with  . Eczema    HPI Vanessa Braun is a 40 y.o. female.   Patient presents with 2-week history of eczema flare on her hands and ears.  She states her hands have been itching and peeling; she has scratched to the point of bleeding.  She used to be followed by dermatology but has not been seen in a few years.  She reports her eczema flare is usually successfully treated with oral steroids and clobetasol cream.  She denies fever or other symptoms.  Her medical history includes eczema, hypertension, PCOS, GERD.  The history is provided by the patient and medical records.    Past Medical History:  Diagnosis Date  . Abnormal Pap smear   . Chronic dermatitis of hands   . Complication of anesthesia    trouble waking up  . Eczema   . GERD (gastroesophageal reflux disease)    lost weight with bypass surgery, no problems since  . Hypertension   . PCOS (polycystic ovarian syndrome)     Patient Active Problem List   Diagnosis Date Noted  . 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 AMA (advanced maternal age) multigravida 6+, first trimester 06/07/2019  . Supervision of high risk pregnancy, antepartum 05/24/2019  . Vitamin D deficiency 07/25/2018  . History of cesarean delivery 07/21/2018  . Chronic hypertension during pregnancy, antepartum 07/21/2018  . Previous gastric bypass affecting pregnancy, antepartum 07/21/2018  . 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 Eczema of both hands 08/16/2014  . Obesity 02/11/2012    Past Surgical History:  Procedure Laterality Date  . ADENOIDECTOMY    . CESAREAN SECTION N/A 01/30/2019   Procedure: CESAREAN SECTION;  Surgeon: Caren Macadam, MD;  Location: [facility];  Service: Obstetrics;  Laterality: N/A;  .  DILATION AND CURETTAGE OF UTERUS    . DILATION AND EVACUATION N/A 04/01/2014   Procedure: DILATATION AND EVACUATION;  Surgeon: Elveria Royals, MD;  Location: [place] [facility];  Service: Gynecology;  Laterality: N/A;  . DILATION AND EVACUATION N/A 12/23/2017   Procedure: DILATATION AND EVACUATION;  Surgeon: Lavonia Drafts, MD;  Location: [facility];  Service: Gynecology;  Laterality: N/A;  . GASTRIC BYPASS    . TONSILLECTOMY AND ADENOIDECTOMY  1999    OB History    Gravida  5   Para  2   Term  1   Preterm  1   AB  3   Living  2     SAB  3   IAB  0   Ectopic  0   Multiple  0   Live Births  2            Home Medications    Prior to Admission medications   Medication Sig Start Date End Date Taking? Authorizing Provider  Clobetasol Prop Emollient Base 0.05 % emollient cream Apply 1 application topically 2 (two) times daily. Avoid contact with face. 12/31/20  Yes Sharion Balloon, NP  methylPREDNISolone (MEDROL DOSEPAK) 4 MG TBPK tablet As directed on package. 12/31/20  Yes Sharion Balloon, NP  Blood Pressure Monitoring (BLOOD PRESSURE KIT) DEVI 1 Device by Does not apply route See admin instructions. Blood pressure cuff 05/24/19   Aletha Halim, MD  ferrous sulfate 325 (65 FE)  MG tablet Take 325 mg by mouth 3 (three) times daily with meals.    [provider]  folic acid (FOLVITE) 1 MG tablet Take 1 tablet (1 mg total) by mouth daily. 07/05/19   Aletha Halim, MD  Prenatal Vit-Fe Fumarate-FA (PREPLUS) 27-1 MG TABS Take 1 tablet by mouth daily. 01/03/19   Chancy Milroy, MD  Triamcinolone Acetonide (TRIAMCINOLONE 0.1 % CREAM : EUCERIN) CREA Apply 1 application topically 2 (two) times daily. FOR BODY 01/31/20   Raylene Everts, MD    Family History Family History  Problem Relation Age of Onset  . Diabetes Mother   . Hyperlipidemia Mother   . Hypertension Mother   . Diabetes Father   . Kidney disease Father   . Hypertension Father   . Hyperlipidemia Father   .  Cancer Father        pancreatic  . Gestational diabetes Sister   . Depression Sister   . Diabetes Sister   . Heart disease Maternal Grandmother   . Heart disease Paternal Grandmother     Social History Social History   Tobacco Use  . Smoking status: Never Smoker  . Smokeless tobacco: Never Used  Vaping Use  . Vaping Use: Never used  Substance Use Topics  . Alcohol use: Not Currently  . Drug use: No     Allergies   Aspirin, Other, and Penicillins   Review of Systems Review of Systems  Constitutional: Negative for chills and fever.  HENT: Negative for ear pain and sore throat.   Eyes: Negative for pain and visual disturbance.  Respiratory: Negative for cough and shortness of breath.   Cardiovascular: Negative for chest pain and palpitations.  Gastrointestinal: Negative for abdominal pain and vomiting.  Genitourinary: Negative for dysuria and hematuria.  Musculoskeletal: Negative for arthralgias and back pain.  Skin: Positive for rash. Negative for color change.  Neurological: Negative for syncope, weakness and numbness.  All other systems reviewed and are negative.    Physical Exam Triage Vital Signs ED Triage Vitals  Enc Vitals Group     BP      Pulse      Resp      Temp      Temp src      SpO2      Weight      Height      Head Circumference      Peak Flow      Pain Score      Pain Loc      Pain Edu?      Excl. in [place]?    No data found.  Updated Vital Signs BP 112/71 (BP Location: Right Arm) Comment (BP Location): large cuff  Pulse 67   Temp (!) 97.5 F (36.4 C) (Oral)   Resp (!) 21   Wt 266 lb (120.7 kg)   LMP 03/14/2019 (Within Days)   SpO2 100%   BMI 39.28 kg/m   Visual Acuity Right Eye Distance:   Left Eye Distance:   Bilateral Distance:    Right Eye Near:   Left Eye Near:    Bilateral Near:     Physical Exam Vitals and nursing note reviewed.  Constitutional:      General: She is not in acute distress.    Appearance: She is  well-developed and well-nourished.  HENT:     Head: Normocephalic and atraumatic.     Mouth/Throat:     Mouth: Mucous membranes are moist.  Eyes:  Conjunctiva/sclera: Conjunctivae normal.  Cardiovascular:     Rate and Rhythm: Normal rate and regular rhythm.     Heart sounds: Normal heart sounds.  Pulmonary:     Effort: Pulmonary effort is normal. No respiratory distress.     Breath sounds: Normal breath sounds.  Abdominal:     Palpations: Abdomen is soft.     Tenderness: There is no abdominal tenderness.  Musculoskeletal:        General: No edema.     Cervical back: Neck supple.  Skin:    General: Skin is warm and dry.     Findings: Rash present.     Comments: Dry, cracking, peeling, hyperpigmented on both hands and on top of the ears consistent with eczema.  Neurological:     General: No focal deficit present.     Mental Status: She is alert and oriented to person, place, and time.     Sensory: No sensory deficit.     Motor: No weakness.     Gait: Gait normal.  Psychiatric:        Mood and Affect: Mood and affect and mood normal.        Behavior: Behavior normal.      UC Treatments / Results  Labs (all labs ordered are listed, but only abnormal results are displayed) Labs Reviewed - No data to display  EKG   Radiology No results found.  Procedures Procedures (including critical care time)  Medications Ordered in UC Medications - No data to display  Initial Impression / Assessment and Plan / UC Course  I have reviewed the triage vital signs and the nursing notes.  Pertinent labs & imaging results that were available during my care of the patient were reviewed by me and considered in my medical decision making (see chart for details).   Eczema.  Skin changes consistent with severe eczema on both hands; mild eczema on ears.  Patient reports the rash is consistent with previous episodes of eczema flare.  Treating with Medrol Dosepak and clobetasol cream.   Instructed patient to avoid her face the clobetasol.  Discussed that she should reestablish with a dermatologist as soon as possible.  Patient agrees to plan of care.   Final Clinical Impressions(s) / UC Diagnoses   Final diagnoses:  Eczema, unspecified type     Discharge Instructions     Take the Medrol Dosepak as directed.  Use the clobetasol cream as directed; Avoid contact with face.    Schedule an appointment with a dermatologist.        ED Prescriptions    Medication Sig Dispense Auth. Provider   Clobetasol Prop Emollient Base 0.05 % emollient cream Apply 1 application topically 2 (two) times daily. Avoid contact with face. 30 g Sharion Balloon, NP   methylPREDNISolone (MEDROL DOSEPAK) 4 MG TBPK tablet As directed on package. 21 tablet Sharion Balloon, NP     PDMP not reviewed this encounter.   Sharion Balloon, NP 12/31/20 [phone]

## 2021-01-05 ENCOUNTER — Ambulatory Visit: Payer: Medicaid Other | Admitting: Obstetrics and Gynecology

## 2021-01-05 ENCOUNTER — Encounter: Payer: Self-pay | Admitting: General Practice

## 2021-01-05 NOTE — Progress Notes (Unsigned)
Patient no showed for appt today in office. Per Dr Donavan Foil, patient can reschedule if she desires.

## 2021-01-28 ENCOUNTER — Other Ambulatory Visit (HOSPITAL_COMMUNITY)
Admission: RE | Admit: 2021-01-28 | Discharge: 2021-01-28 | Disposition: A | Discharge status: Home / Self Care | Payer: Medicaid Other | Source: Ambulatory Visit | Attending: Oral Surgery | Admitting: Oral Surgery

## 2021-01-28 DIAGNOSIS — Z01812 Encounter for preprocedural laboratory examination: Secondary | ICD-10-CM | POA: Insufficient documentation

## 2021-01-28 DIAGNOSIS — Z20822 Contact with and (suspected) exposure to covid-19: Secondary | ICD-10-CM | POA: Insufficient documentation

## 2021-01-28 LAB — SARS CORONAVIRUS 2 (TAT 6-24 HRS): SARS Coronavirus 2: NEGATIVE

## 2021-01-28 NOTE — H&P (Signed)
HISTORY AND PHYSICAL  Bekka Qian Debord is a 40 y.o. female patient with CC: painful teeth No diagnosis found.  Past Medical History:  Diagnosis Date  . Abnormal Pap smear   . Chronic dermatitis of hands   . Complication of anesthesia    trouble waking up  . Eczema   . GERD (gastroesophageal reflux disease)    lost weight with bypass surgery, no problems since  . Hypertension   . PCOS (polycystic ovarian syndrome)     No current facility-administered medications for this encounter.   Current Outpatient Medications  Medication Sig Dispense Refill  . acetaminophen (TYLENOL) 325 MG tablet Take 325 mg by mouth every 6 (six) hours as needed for moderate pain.    . cholecalciferol (VITAMIN D3) 25 MCG (1000 UNIT) tablet Take 1,000 Units by mouth daily.    . Clobetasol Prop Emollient Base 0.05 % emollient cream Apply 1 application topically 2 (two) times daily. Avoid contact with face. (Patient taking differently: Apply 1 application topically 2 (two) times daily as needed (hands). Avoid contact with face.) 30 g 0  . Cyanocobalamin (B-12) 1000 MCG SUBL Place 1,000 mcg under the tongue daily. Liquid    . ferrous sulfate 325 (65 FE) MG tablet Take 325 mg by mouth daily.    . Multiple Vitamins-Minerals (MULTIVITAMIN WITH MINERALS) tablet Take 1 tablet by mouth daily.    . Blood Pressure Monitoring (BLOOD PRESSURE KIT) DEVI 1 Device by Does not apply route See admin instructions. Blood pressure cuff 1 Device 0  . folic acid (FOLVITE) 1 MG tablet Take 1 tablet (1 mg total) by mouth daily. 60 tablet 3   Allergies  Allergen Reactions  . Aspirin     Contraindicated due to gastric bypass  . Other     "anything antibacterial"; some creams for dermatitis  . Penicillins Itching and Rash   Active Problems:   * No active hospital problems. *  Vitals: Last menstrual period 03/14/2019, currently breastfeeding. Lab results:No results found for this or any previous visit (from the past 58  hour(s)). Radiology Results: No results found. General appearance: alert, cooperative and morbidly obese Head: Normocephalic, without obvious abnormality, atraumatic Eyes: negative Nose: Nares normal. Septum midline. Mucosa normal. No drainage or sinus tenderness. Throat: lips, mucosa, and tongue normal; teeth and gums normal and dsental caries # 1, 3, 17, 32 Neck: no adenopathy and supple, symmetrical, trachea midline Resp: clear to auscultation bilaterally Cardio: regular rate and rhythm, S1, S2 normal, no murmur, click, rub or gallop  Assessment: Non-restorable teeth secondary to dental caries. Plan: Dental extractions with GA. Day surgery.   Diona Browner 01/28/2021

## 2021-01-29 ENCOUNTER — Encounter (HOSPITAL_COMMUNITY): Payer: Self-pay | Admitting: Oral Surgery

## 2021-01-29 NOTE — Progress Notes (Signed)
Spoke with pt for pre-op call. Pt states she was treated for a short time for HTN after her last pregnancy in 2021. Pt denies cardiac history or diabetes.   Covid test done 01/28/21 and it's negative. Pt states she's been in quarantine since the test was done and understands that she stays in quarantine until she comes to the hospital tomorrow.

## 2021-01-30 ENCOUNTER — Encounter (HOSPITAL_COMMUNITY)
Admission: RE | Disposition: A | Discharge status: Home / Self Care | Payer: Self-pay | Source: Home / Self Care | Attending: Oral Surgery

## 2021-01-30 ENCOUNTER — Encounter (HOSPITAL_COMMUNITY): Payer: Self-pay | Admitting: Oral Surgery

## 2021-01-30 ENCOUNTER — Ambulatory Visit (HOSPITAL_COMMUNITY): Payer: Medicaid Other | Admitting: Certified Registered"

## 2021-01-30 ENCOUNTER — Other Ambulatory Visit: Payer: Self-pay

## 2021-01-30 ENCOUNTER — Ambulatory Visit (HOSPITAL_COMMUNITY)
Admission: RE | Admit: 2021-01-30 | Discharge: 2021-01-30 | Disposition: A | Discharge status: Home / Self Care | Payer: Medicaid Other | Attending: Oral Surgery | Admitting: Oral Surgery

## 2021-01-30 DIAGNOSIS — Z6839 Body mass index (BMI) 39.0-39.9, adult: Secondary | ICD-10-CM | POA: Diagnosis not present

## 2021-01-30 DIAGNOSIS — F419 Anxiety disorder, unspecified: Secondary | ICD-10-CM | POA: Insufficient documentation

## 2021-01-30 DIAGNOSIS — K029 Dental caries, unspecified: Secondary | ICD-10-CM | POA: Diagnosis present

## 2021-01-30 DIAGNOSIS — Z886 Allergy status to analgesic agent status: Secondary | ICD-10-CM | POA: Diagnosis not present

## 2021-01-30 DIAGNOSIS — Z880 Allergy status to penicillin: Secondary | ICD-10-CM | POA: Insufficient documentation

## 2021-01-30 HISTORY — PX: TOOTH EXTRACTION: SHX859

## 2021-01-30 HISTORY — DX: Anemia, unspecified: D64.9

## 2021-01-30 HISTORY — DX: Unspecified osteoarthritis, unspecified site: M19.90

## 2021-01-30 LAB — CBC
HCT: 35.2 % — ABNORMAL LOW (ref 36.0–46.0)
Hemoglobin: 11.3 g/dL — ABNORMAL LOW (ref 12.0–15.0)
MCH: 30 pg (ref 26.0–34.0)
MCHC: 32.1 g/dL (ref 30.0–36.0)
MCV: 93.4 fL (ref 80.0–100.0)
Platelets: 217 10*3/uL (ref 150–400)
RBC: 3.77 MIL/uL — ABNORMAL LOW (ref 3.87–5.11)
RDW: 13.2 % (ref 11.5–15.5)
WBC: 8.9 10*3/uL (ref 4.0–10.5)
nRBC: 0 % (ref 0.0–0.2)

## 2021-01-30 LAB — BASIC METABOLIC PANEL
Anion gap: 9 (ref 5–15)
BUN: 8 mg/dL (ref 6–20)
CO2: 23 mmol/L (ref 22–32)
Calcium: 8.6 mg/dL — ABNORMAL LOW (ref 8.9–10.3)
Chloride: 102 mmol/L (ref 98–111)
Creatinine, Ser: 0.89 mg/dL (ref 0.44–1.00)
GFR, Estimated: >60 mL/min (ref >60)
Glucose, Bld: 95 mg/dL (ref 70–99)
Potassium: 3.4 mmol/L — ABNORMAL LOW (ref 3.5–5.1)
Sodium: 134 mmol/L — ABNORMAL LOW (ref 135–145)

## 2021-01-30 SURGERY — DENTAL RESTORATION/EXTRACTIONS
Anesthesia: General

## 2021-01-30 MED ORDER — SUCCINYLCHOLINE CHLORIDE 200 MG/10ML IV SOSY
PREFILLED_SYRINGE | INTRAVENOUS | Status: AC
  Filled 2021-01-30: qty 10

## 2021-01-30 MED ORDER — PHENYLEPHRINE 40 MCG/ML (10ML) SYRINGE FOR IV PUSH (FOR BLOOD PRESSURE SUPPORT)
PREFILLED_SYRINGE | INTRAVENOUS | Status: AC
  Filled 2021-01-30: qty 10

## 2021-01-30 MED ORDER — 0.9 % SODIUM CHLORIDE (POUR BTL) OPTIME
TOPICAL | Status: DC | PRN
  Administered 2021-01-30: 1000 mL

## 2021-01-30 MED ORDER — CHLORHEXIDINE GLUCONATE 0.12 % MT SOLN
OROMUCOSAL | Status: AC
  Administered 2021-01-30: 15 mL via OROMUCOSAL
  Filled 2021-01-30: qty 15

## 2021-01-30 MED ORDER — LIDOCAINE 2% (20 MG/ML) 5 ML SYRINGE
INTRAMUSCULAR | Status: DC | PRN
  Administered 2021-01-30: 60 mg via INTRAVENOUS

## 2021-01-30 MED ORDER — OXYMETAZOLINE HCL 0.05 % NA SOLN
NASAL | Status: AC
  Filled 2021-01-30: qty 30

## 2021-01-30 MED ORDER — PHENYLEPHRINE 40 MCG/ML (10ML) SYRINGE FOR IV PUSH (FOR BLOOD PRESSURE SUPPORT)
PREFILLED_SYRINGE | INTRAVENOUS | Status: DC | PRN
  Administered 2021-01-30: 160 ug via INTRAVENOUS
  Administered 2021-01-30 (×2): 120 ug via INTRAVENOUS

## 2021-01-30 MED ORDER — HYDROCODONE-ACETAMINOPHEN 5-325 MG PO TABS: 1.0000 | ORAL_TABLET | Freq: Four times a day (QID) | ORAL | 0 refills | Status: DC | PRN

## 2021-01-30 MED ORDER — GLYCOPYRROLATE PF 0.2 MG/ML IJ SOSY
PREFILLED_SYRINGE | INTRAMUSCULAR | Status: AC
  Filled 2021-01-30: qty 2

## 2021-01-30 MED ORDER — MEPERIDINE HCL 25 MG/ML IJ SOLN: 6.2500 mg | INTRAMUSCULAR | Status: DC | PRN

## 2021-01-30 MED ORDER — PROPOFOL 10 MG/ML IV BOLUS
INTRAVENOUS | Status: DC | PRN
  Administered 2021-01-30: 200 mg via INTRAVENOUS

## 2021-01-30 MED ORDER — ACETAMINOPHEN 500 MG PO TABS
1000.0000 mg | ORAL_TABLET | Freq: Once | ORAL | Status: AC
  Administered 2021-01-30: 1000 mg via ORAL
  Filled 2021-01-30: qty 2

## 2021-01-30 MED ORDER — SUCCINYLCHOLINE CHLORIDE 20 MG/ML IJ SOLN
INTRAMUSCULAR | Status: DC | PRN
  Administered 2021-01-30: 140 mg via INTRAVENOUS

## 2021-01-30 MED ORDER — FENTANYL CITRATE (PF) 250 MCG/5ML IJ SOLN
INTRAMUSCULAR | Status: AC
  Filled 2021-01-30: qty 5

## 2021-01-30 MED ORDER — LIDOCAINE-EPINEPHRINE 2 %-1:100000 IJ SOLN
INTRAMUSCULAR | Status: AC
  Filled 2021-01-30: qty 5.1

## 2021-01-30 MED ORDER — SODIUM CHLORIDE 0.9 % IR SOLN
Status: DC | PRN
  Administered 2021-01-30: 1000 mL

## 2021-01-30 MED ORDER — CHLORHEXIDINE GLUCONATE 0.12 % MT SOLN: 15.0000 mL | Freq: Once | OROMUCOSAL | Status: AC

## 2021-01-30 MED ORDER — ORAL CARE MOUTH RINSE: 15.0000 mL | Freq: Once | OROMUCOSAL | Status: AC

## 2021-01-30 MED ORDER — LIDOCAINE-EPINEPHRINE 2 %-1:100000 IJ SOLN
INTRAMUSCULAR | Status: AC
  Filled 2021-01-30: qty 1

## 2021-01-30 MED ORDER — PROMETHAZINE HCL 25 MG/ML IJ SOLN: 6.2500 mg | INTRAMUSCULAR | Status: DC | PRN

## 2021-01-30 MED ORDER — HYDROMORPHONE HCL 1 MG/ML IJ SOLN: 0.2500 mg | INTRAMUSCULAR | Status: DC | PRN

## 2021-01-30 MED ORDER — LIDOCAINE 2% (20 MG/ML) 5 ML SYRINGE
INTRAMUSCULAR | Status: AC
  Filled 2021-01-30: qty 5

## 2021-01-30 MED ORDER — OXYMETAZOLINE HCL 0.05 % NA SOLN
NASAL | Status: DC | PRN
  Administered 2021-01-30: 1 via TOPICAL

## 2021-01-30 MED ORDER — ONDANSETRON HCL 4 MG/2ML IJ SOLN
INTRAMUSCULAR | Status: DC | PRN
  Administered 2021-01-30: 4 mg via INTRAVENOUS

## 2021-01-30 MED ORDER — DEXAMETHASONE SODIUM PHOSPHATE 10 MG/ML IJ SOLN
INTRAMUSCULAR | Status: DC | PRN
  Administered 2021-01-30: 10 mg via INTRAVENOUS

## 2021-01-30 MED ORDER — MIDAZOLAM HCL 2 MG/2ML IJ SOLN
INTRAMUSCULAR | Status: AC
  Filled 2021-01-30: qty 2

## 2021-01-30 MED ORDER — ALBUTEROL SULFATE HFA 108 (90 BASE) MCG/ACT IN AERS
INHALATION_SPRAY | RESPIRATORY_TRACT | Status: DC | PRN
  Administered 2021-01-30: 6 via RESPIRATORY_TRACT

## 2021-01-30 MED ORDER — LIDOCAINE-EPINEPHRINE 2 %-1:100000 IJ SOLN
INTRAMUSCULAR | Status: DC | PRN
  Administered 2021-01-30: 13 mL via INTRADERMAL

## 2021-01-30 MED ORDER — FENTANYL CITRATE (PF) 100 MCG/2ML IJ SOLN
INTRAMUSCULAR | Status: DC | PRN
  Administered 2021-01-30: 50 ug via INTRAVENOUS
  Administered 2021-01-30: 100 ug via INTRAVENOUS

## 2021-01-30 MED ORDER — LACTATED RINGERS IV SOLN: INTRAVENOUS | Status: DC

## 2021-01-30 MED ORDER — PROPOFOL 10 MG/ML IV BOLUS
INTRAVENOUS | Status: AC
  Filled 2021-01-30: qty 40

## 2021-01-30 MED ORDER — MIDAZOLAM HCL 5 MG/5ML IJ SOLN
INTRAMUSCULAR | Status: DC | PRN
  Administered 2021-01-30: 2 mg via INTRAVENOUS

## 2021-01-30 MED ORDER — CLINDAMYCIN PHOSPHATE 600 MG/50ML IV SOLN
600.0000 mg | INTRAVENOUS | Status: AC
  Administered 2021-01-30: 600 mg via INTRAVENOUS
  Filled 2021-01-30: qty 50

## 2021-01-30 MED ORDER — OXYCODONE HCL 5 MG/5ML PO SOLN
5.0000 mg | Freq: Once | ORAL | Status: DC | PRN
Start: 2021-01-30 — End: 2021-01-30

## 2021-01-30 MED ORDER — NEOSTIGMINE METHYLSULFATE 3 MG/3ML IV SOSY
PREFILLED_SYRINGE | INTRAVENOUS | Status: AC
  Filled 2021-01-30: qty 3

## 2021-01-30 MED ORDER — OXYCODONE HCL 5 MG PO TABS: 5.0000 mg | ORAL_TABLET | Freq: Once | ORAL | Status: DC | PRN

## 2021-01-30 SURGICAL SUPPLY — 36 items
BLADE SURG 15 STRL LF DISP TIS (BLADE) ×1 IMPLANT
BLADE SURG 15 STRL SS (BLADE) ×1
BUR CROSS CUT FISSURE 1.6 (BURR) ×2 IMPLANT
BUR EGG ELITE 4.0 (BURR) ×2 IMPLANT
CANISTER SUCT 3000ML PPV (MISCELLANEOUS) ×2 IMPLANT
COVER SURGICAL LIGHT HANDLE (MISCELLANEOUS) ×2 IMPLANT
COVER WAND RF STERILE (DRAPES) IMPLANT
DECANTER SPIKE VIAL GLASS SM (MISCELLANEOUS) ×2 IMPLANT
DRAPE U-SHAPE 76X120 STRL (DRAPES) ×2 IMPLANT
GAUZE PACKING FOLDED 2  STR (GAUZE/BANDAGES/DRESSINGS) ×1
GAUZE PACKING FOLDED 2 STR (GAUZE/BANDAGES/DRESSINGS) ×1 IMPLANT
GLOVE BIO SURGEON STRL SZ 6.5 (GLOVE) IMPLANT
GLOVE BIO SURGEON STRL SZ7 (GLOVE) IMPLANT
GLOVE BIO SURGEON STRL SZ8 (GLOVE) ×2 IMPLANT
GLOVE SURG UNDER POLY LF SZ6.5 (GLOVE) IMPLANT
GLOVE SURG UNDER POLY LF SZ7 (GLOVE) IMPLANT
GOWN STRL REUS W/ TWL LRG LVL3 (GOWN DISPOSABLE) ×1 IMPLANT
GOWN STRL REUS W/ TWL XL LVL3 (GOWN DISPOSABLE) ×1 IMPLANT
GOWN STRL REUS W/TWL LRG LVL3 (GOWN DISPOSABLE) ×1
GOWN STRL REUS W/TWL XL LVL3 (GOWN DISPOSABLE) ×1
IV NS 1000ML (IV SOLUTION) ×1
IV NS 1000ML BAXH (IV SOLUTION) ×1 IMPLANT
KIT BASIN OR (CUSTOM PROCEDURE TRAY) ×2 IMPLANT
KIT TURNOVER KIT B (KITS) ×2 IMPLANT
NDL HYPO 25GX1X1/2 BEV (NEEDLE) ×2 IMPLANT
NEEDLE HYPO 25GX1X1/2 BEV (NEEDLE) ×4 IMPLANT
NS IRRIG 1000ML POUR BTL (IV SOLUTION) ×2 IMPLANT
PAD ARMBOARD 7.5X6 YLW CONV (MISCELLANEOUS) ×2 IMPLANT
SLEEVE IRRIGATION ELITE 7 (MISCELLANEOUS) ×3 IMPLANT
SPONGE SURGIFOAM ABS GEL 12-7 (HEMOSTASIS) IMPLANT
SUT CHROMIC 3 0 PS 2 (SUTURE) ×2 IMPLANT
SYR BULB IRRIG 60ML STRL (SYRINGE) ×2 IMPLANT
SYR CONTROL 10ML LL (SYRINGE) ×2 IMPLANT
TRAY ENT MC OR (CUSTOM PROCEDURE TRAY) ×2 IMPLANT
TUBING IRRIGATION (MISCELLANEOUS) ×2 IMPLANT
YANKAUER SUCT BULB TIP NO VENT (SUCTIONS) ×2 IMPLANT

## 2021-01-30 NOTE — Transfer of Care (Signed)
Immediate Anesthesia Transfer of Care Note  Patient: Vanessa Braun  Procedure(s) Performed: DENTAL RESTORATION/EXTRACTIONS (N/A )  Patient Location: PACU  Anesthesia Type:General  Level of Consciousness: drowsy  Airway & Oxygen Therapy: Patient Spontanous Breathing and Patient connected to nasal cannula oxygen  Post-op Assessment: Report given to RN and Post -op Vital signs reviewed and stable  Post vital signs: Reviewed and stable  Last Vitals:  Vitals Value Taken Time  BP 158/99 01/30/21 0820  Temp    Pulse 91 01/30/21 0823  Resp 21 01/30/21 0823  SpO2 100 % 01/30/21 0823  Vitals shown include unvalidated device data.  Last Pain:  Vitals:   01/30/21 0636  TempSrc:   PainSc: 5       Patients Stated Pain Goal: 5 (01/30/21 0636)  Complications: No complications documented.

## 2021-01-30 NOTE — Anesthesia Preprocedure Evaluation (Addendum)
Anesthesia Evaluation    Reviewed: Allergy & Precautions, Patient's Chart, lab work & pertinent test results  History of Anesthesia Complications Negative for: history of anesthetic complications  Airway Mallampati: I  TM Distance: >3 FB Neck ROM: Full    Dental no notable dental hx. (+) Dental Advisory Given, Teeth Intact   Pulmonary neg pulmonary ROS,    Pulmonary exam normal breath sounds clear to auscultation       Cardiovascular hypertension, Normal cardiovascular exam Rhythm:Regular Rate:Normal  155/100 in preop- per pt normally much lower than this, thinks it is 2/2 knee pain (has been getting serial steroid injections and fluid drained off knee every few months)   Neuro/Psych negative neurological ROS  negative psych ROS   GI/Hepatic Neg liver ROS, neg GERD  ,S/p gastric bypass 5 years ago Denies GERD   Endo/Other  Morbid obesityBMI 40  Renal/GU negative Renal ROS  negative genitourinary   Musculoskeletal  (+) Arthritis , Osteoarthritis,    Abdominal (+) + obese,   Peds  Hematology negative hematology ROS (+)   Anesthesia Other Findings Dental caries  Reproductive/Obstetrics negative OB ROS                           Anesthesia Physical Anesthesia Plan  ASA: III  Anesthesia Plan: General   Post-op Pain Management:    Induction: Intravenous  PONV Risk Score and Plan: 4 or greater and Ondansetron, Dexamethasone, Midazolam and Treatment may vary due to age or medical condition  Airway Management Planned: Nasal ETT  Additional Equipment: None  Intra-op Plan:   Post-operative Plan: Extubation in OR  Informed Consent: I have reviewed the patients History and Physical, chart, labs and discussed the procedure including the risks, benefits and alternatives for the proposed anesthesia with the patient or authorized representative who has indicated his/her understanding and  acceptance.     Dental advisory given  Plan Discussed with: CRNA  Anesthesia Plan Comments:        Anesthesia Quick Evaluation

## 2021-01-30 NOTE — Op Note (Signed)
01/30/2021  8:10 AM  PATIENT:  Vanessa Braun  40 y.o. female  PRE-OPERATIVE DIAGNOSIS:  DENTAL CARIES # 1, 3, 17, 32  POST-OPERATIVE DIAGNOSIS:  SAME  PROCEDURE:  Procedure(s): DENTAL /EXTRACTIONS # 1, 3, 17, 32  SURGEON:  Surgeon(s): Barbette Merino, Acupuncturist, DMD  ANESTHESIA:   local and general  EBL:  minimal  DRAINS: none   SPECIMEN:  No Specimen  COUNTS:  YES  PLAN OF CARE: Discharge to home after PACU  PATIENT DISPOSITION:  PACU - hemodynamically stable.   PROCEDURE DETAILS: Dictation # [id number]  Georgia Lopes, DMD 01/30/2021 8:10 AM

## 2021-01-30 NOTE — Anesthesia Procedure Notes (Signed)
Procedure Name: Intubation Date/Time: 01/30/2021 7:40 AM Performed by: Lytle Michaels, CRNA Pre-anesthesia Checklist: Patient identified, Emergency Drugs available, Suction available and Patient being monitored Patient Re-evaluated:Patient Re-evaluated prior to induction Oxygen Delivery Method: Circle system utilized Preoxygenation: Pre-oxygenation with 100% oxygen Induction Type: IV induction Ventilation: Mask ventilation without difficulty Laryngoscope Size: Miller and 2 Grade View: Grade I Nasal Tubes: Nasal prep performed, Nasal Rae and Right Tube size: 7.0 mm Number of attempts: 1 Airway Equipment and Method: Stylet and Oral airway Placement Confirmation: ETT inserted through vocal cords under direct vision,  positive ETCO2 and breath sounds checked- equal and bilateral Tube secured with: Tape Dental Injury: Teeth and Oropharynx as per pre-operative assessment

## 2021-01-30 NOTE — Anesthesia Postprocedure Evaluation (Signed)
Anesthesia Post Note  Patient: Vanessa Braun  Procedure(s) Performed: DENTAL RESTORATION/EXTRACTIONS (N/A )     Patient location during evaluation: PACU Anesthesia Type: General Level of consciousness: awake and alert, oriented and patient cooperative Pain management: pain level controlled Vital Signs Assessment: post-procedure vital signs reviewed and stable Respiratory status: spontaneous breathing, nonlabored ventilation and respiratory function stable Cardiovascular status: blood pressure returned to baseline and stable Postop Assessment: no apparent nausea or vomiting Anesthetic complications: no   No complications documented.  Last Vitals:  Vitals:   01/30/21 0850 01/30/21 0906  BP: (!) 162/94 (!) 147/93  Pulse: 88 71  Resp: 18 17  Temp:  (!) 36.2 C  SpO2: 94% 94%    Last Pain:  Vitals:   01/30/21 0850  TempSrc:   PainSc: 0-No pain                 Lannie Fields

## 2021-01-30 NOTE — Op Note (Signed)
NAMEANALISIA, Vanessa Braun MEDICAL RECORD NO: [id number] ACCOUNT NO: [id number] DATE OF BIRTH: [birth year] FACILITY: [facility] LOCATION: [facility] PHYSICIAN: Georgia Lopes, DDS  Operative Report   PREOPERATIVE DIAGNOSIS:  Dental caries teeth #1, 3, 17 and 32.  POSTOPERATIVE DIAGNOSIS:  Dental caries teeth #1, 3, 17 and 32.  PROCEDURE:  Extraction teeth #1, 3, 17 and 32.  SURGEON:  Georgia Lopes, DDS  ANESTHESIA:  General.  Dr. Salvadore Farber attending, nasal intubation.  INDICATIONS FOR PROCEDURE:  The patient is a 40 year old female who was referred to my office by her general dentist for removal of multiple nonrestorable teeth due to dental caries.  The patient has severe dental anxiety and requested general  anesthesia.  Because of her morbid obesity, hypertension and GERD, it was recommended the patient undergo the procedure at [facility], so she could be intubated for airway protection and the patient safety.  DESCRIPTION OF PROCEDURE:  The patient was taken to the operating room and placed on the table in supine position.  General anesthesia was administered and a nasal endotracheal tube was placed and secured.  The eyes were protected.  The patient was  draped for surgery and timeout was performed.  The posterior pharynx was suctioned and a throat pack was placed.  2% lidocaine 1:100,000 epinephrine was infiltrated in an inferior alveolar block on the right and left sides and in buccal and palatal  infiltration around teeth #1 and 3.  Total of 13 mL was utilized.  The bite block was placed in the right side of the mouth.  A sweetheart retractor was used to retract the tongue.  A #15 blade was used to incise the gingiva and the gingival sulcus  around tooth #17.  The periosteum was reflected.  The tooth was elevated with a 301 elevator and removed from the mouth with a dental forceps.  The socket was curetted, irrigated.  No closure was needed.  The bite block was repositioned to the other side   of the mouth.  A 15 blade was used to make an incision in the gingival sulcus around teeth #1, 3 and 32.  Periosteum was reflected.  Tooth #32 was elevated with a 301 elevator and the tooth was removed with dental forceps.  Socket was irrigated.  No  suture was indicated.  Teeth #1 and 3 were elevated with a 301 elevator.  The teeth were removed with dental forceps.  However, the distal buccal root of tooth #1 fractured during removal and the distal buccal root #3 fractured as well.  The Stryker  handpiece was then used to remove bone around these residual roots and using the root tips and 301 elevator and hemostats, the root tips were removed.  Then, the sockets were curetted, irrigated and teeth #1 and 3 were closed with chromic.  The oral  cavity was then irrigated and suctioned.  The throat pack was removed.  The patient was left in the care of anesthesia for extubation and transport to the recovery room with plans for discharge home through day surgery.  ESTIMATED BLOOD LOSS:  Minimal.  COMPLICATIONS:  None.  SPECIMENS:  None.   NIK D: 01/30/2021 8:14:24 am T: 01/30/2021 11:17:00 am  JOB: [id number]/ [id number]

## 2021-01-30 NOTE — H&P (Signed)
H&P documentation  -History and Physical Reviewed  -Patient has been re-examined  -No change in the plan of care  Vanessa Braun  

## 2021-01-31 ENCOUNTER — Encounter (HOSPITAL_COMMUNITY): Payer: Self-pay | Admitting: Oral Surgery

## 2021-02-22 ENCOUNTER — Other Ambulatory Visit: Payer: Self-pay

## 2021-02-22 ENCOUNTER — Encounter (HOSPITAL_COMMUNITY): Payer: Self-pay | Admitting: Emergency Medicine

## 2021-02-22 ENCOUNTER — Emergency Department (HOSPITAL_COMMUNITY): Payer: Medicaid Other

## 2021-02-22 ENCOUNTER — Emergency Department (HOSPITAL_COMMUNITY)
Admission: EM | Admit: 2021-02-22 | Discharge: 2021-02-22 | Disposition: A | Discharge status: Home / Self Care | Payer: Medicaid Other | Attending: Emergency Medicine | Admitting: Emergency Medicine

## 2021-02-22 DIAGNOSIS — I10 Essential (primary) hypertension: Secondary | ICD-10-CM | POA: Insufficient documentation

## 2021-02-22 DIAGNOSIS — J069 Acute upper respiratory infection, unspecified: Secondary | ICD-10-CM | POA: Insufficient documentation

## 2021-02-22 DIAGNOSIS — Z20822 Contact with and (suspected) exposure to covid-19: Secondary | ICD-10-CM | POA: Insufficient documentation

## 2021-02-22 DIAGNOSIS — R5381 Other malaise: Secondary | ICD-10-CM | POA: Insufficient documentation

## 2021-02-22 DIAGNOSIS — R059 Cough, unspecified: Secondary | ICD-10-CM | POA: Diagnosis present

## 2021-02-22 LAB — GROUP A STREP BY PCR: Group A Strep by PCR: NOT DETECTED

## 2021-02-22 LAB — RESP PANEL BY RT-PCR (FLU A&B, COVID) ARPGX2
Influenza A by PCR: NEGATIVE
Influenza B by PCR: NEGATIVE
SARS Coronavirus 2 by RT PCR: NEGATIVE

## 2021-02-22 MED ORDER — ALBUTEROL SULFATE HFA 108 (90 BASE) MCG/ACT IN AERS
2.0000 | INHALATION_SPRAY | Freq: Once | RESPIRATORY_TRACT | Status: AC
  Administered 2021-02-22: 2 via RESPIRATORY_TRACT
  Filled 2021-02-22: qty 6.7

## 2021-02-22 NOTE — Discharge Instructions (Signed)
Please return for any problem.  °

## 2021-02-22 NOTE — ED Triage Notes (Signed)
C/o dry cough and chest congestion x 1 week.  Took negative home COVID test.

## 2021-02-22 NOTE — ED Provider Notes (Signed)
[facility] EMERGENCY DEPARTMENT Provider Note   CSN: [id number] Arrival date & time: 02/22/21  0856     History No chief complaint on file.   Vanessa Braun is a 40 y.o. female.  40 year old female with prior medical history as detailed below presents for evaluation.  Patient with approximately 5 to 6 days of cough, congestion, malaise, and fatigue.  Patient reports that she did a home COVID test that was negative.  She reports multiple sick contacts with similar complaints.  She complains of mild sore throat as well.  She denies fever in the last 48 hours.  She denies shortness of breath.  The history is provided by the patient and medical records.  Cough Cough characteristics:  Dry Sputum characteristics:  Nondescript Severity:  Moderate Onset quality:  Gradual Duration:  6 days Timing:  Constant Progression:  Waxing and waning Chronicity:  New Smoker: no   Relieved by:  Nothing Worsened by:  Nothing      Past Medical History:  Diagnosis Date  . Abnormal Pap smear   . Anemia    during pregnancy  . Arthritis    knee  . Chronic dermatitis of hands   . Complication of anesthesia    trouble waking up  . Eczema   . GERD (gastroesophageal reflux disease)    lost weight with bypass surgery, no problems since  . Hypertension    just after pregnancy (treated for a short time)  . PCOS (polycystic ovarian syndrome)     Patient Active Problem List   Diagnosis Date Noted  . 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 AMA (advanced maternal age) multigravida 72+, first trimester 06/07/2019  . Supervision of high risk pregnancy, antepartum 05/24/2019  . Vitamin D deficiency 07/25/2018  . History of cesarean delivery 07/21/2018  . Chronic hypertension during pregnancy, antepartum 07/21/2018  . Previous gastric bypass affecting pregnancy, antepartum 07/21/2018  . 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 Eczema of both hands 08/16/2014  . Obesity 02/11/2012    Past Surgical History:  Procedure Laterality Date  . ABDOMINAL HYSTERECTOMY    . ADENOIDECTOMY    . CESAREAN SECTION N/A 01/30/2019   Procedure: CESAREAN SECTION;  Surgeon: Caren Macadam, MD;  Location: [facility];  Service: Obstetrics;  Laterality: N/A;  . CESAREAN SECTION     2021  . DILATION AND CURETTAGE OF UTERUS    . DILATION AND EVACUATION N/A 04/01/2014   Procedure: DILATATION AND EVACUATION;  Surgeon: Elveria Royals, MD;  Location: [facility];  Service: Gynecology;  Laterality: N/A;  . DILATION AND EVACUATION N/A 12/23/2017   Procedure: DILATATION AND EVACUATION;  Surgeon: Lavonia Drafts, MD;  Location: [facility];  Service: Gynecology;  Laterality: N/A;  . GASTRIC BYPASS    . TONSILLECTOMY AND ADENOIDECTOMY  1999  . TOOTH EXTRACTION N/A 01/30/2021   Procedure: DENTAL RESTORATION/EXTRACTIONS;  Surgeon: Diona Browner, DMD;  Location: [facility];  Service: Oral Surgery;  Laterality: N/A;     OB History    Gravida  5   Para  2   Term  1   Preterm  1   AB  3   Living  2     SAB  3   IAB  0   Ectopic  0   Multiple  0   Live Births  2           Family History  Problem Relation Age of Onset  . Diabetes Mother   . Hyperlipidemia Mother   . Hypertension Mother   . Diabetes Father   . Kidney disease Father   . Hypertension Father   . Hyperlipidemia Father   . Cancer Father        pancreatic  . Gestational diabetes Sister   . Depression Sister   . Diabetes Sister   . Heart disease Maternal Grandmother   . Heart disease Paternal Grandmother     Social History   Tobacco Use  . Smoking status: Never Smoker  . Smokeless tobacco: Never Used  Vaping Use  . Vaping Use: Never used  Substance Use Topics  . Alcohol use: Yes    Comment: occasional beer  . Drug use: No    Home Medications Prior to Admission medications   Medication Sig Start Date End Date  Taking? Authorizing Provider  acetaminophen (TYLENOL) 325 MG tablet Take 325 mg by mouth every 6 (six) hours as needed for moderate pain.    [provider]  Blood Pressure Monitoring (BLOOD PRESSURE KIT) DEVI 1 Device by Does not apply route See admin instructions. Blood pressure cuff 05/24/19   Aletha Halim, MD  cholecalciferol (VITAMIN D3) 25 MCG (1000 UNIT) tablet Take 1,000 Units by mouth daily.    [provider]  Clobetasol Prop Emollient Base 0.05 % emollient cream Apply 1 application topically 2 (two) times daily. Avoid contact with face. Patient taking differently: Apply 1 application topically 2 (two) times daily as needed (hands). Avoid contact with face. 12/31/20   Sharion Balloon, NP  Cyanocobalamin (B-12) 1000 MCG SUBL Place 1,000 mcg under the tongue daily. Liquid    [provider]  ferrous sulfate 325 (65 FE) MG tablet Take 325 mg by mouth daily.    [provider]  folic acid (FOLVITE) 1 MG tablet Take 1 tablet (1 mg total) by mouth daily. 07/05/19   Aletha Halim, MD  HYDROcodone-acetaminophen (NORCO) 5-325 MG tablet Take 1 tablet by mouth every 6 (six) hours as needed for moderate pain. 01/30/21   Diona Browner, DMD  Multiple Vitamins-Minerals (MULTIVITAMIN WITH MINERALS) tablet Take 1 tablet by mouth daily.    [provider]    Allergies    Aspirin, Other, and Penicillins  Review of Systems   Review of Systems  Respiratory: Positive for cough.   All other systems reviewed and are negative.   Physical Exam Updated Vital Signs BP (!) 130/104 (BP Location: Left Arm)   Pulse 89   Temp 97.9 F (36.6 C) (Oral)   Resp 16   LMP 03/14/2019 (Within Days)   SpO2 93%   Physical Exam Vitals and nursing note reviewed.  Constitutional:      General: She is not in acute distress.    Appearance: Normal appearance. She is well-developed.  HENT:     Head: Normocephalic and atraumatic.     Right Ear: Tympanic membrane normal.      Left Ear: Tympanic membrane normal.  Eyes:     Conjunctiva/sclera: Conjunctivae normal.     Pupils: Pupils are equal, round, and reactive to light.  Cardiovascular:     Rate and Rhythm: Normal rate and regular rhythm.     Heart sounds: Normal heart sounds.  Pulmonary:     Effort: Pulmonary effort is normal. No respiratory distress.     Breath sounds: Normal breath sounds.  Abdominal:     General: There is no distension.     Palpations:  Abdomen is soft.     Tenderness: There is no abdominal tenderness.  Musculoskeletal:        General: No deformity. Normal range of motion.     Cervical back: Normal range of motion and neck supple.  Skin:    General: Skin is warm and dry.  Neurological:     Mental Status: She is alert and oriented to person, place, and time.     ED Results / Procedures / Treatments   Labs (all labs ordered are listed, but only abnormal results are displayed) Labs Reviewed  RESP PANEL BY RT-PCR (FLU A&B, COVID) ARPGX2  GROUP A STREP BY PCR    EKG None  Radiology DG Chest 2 View  Result Date: 02/22/2021 CLINICAL DATA:  Cough. Shortness of breath. Negative COVID-19 test. EXAM: CHEST - 2 VIEW COMPARISON:  May 25, 2016 FINDINGS: The heart size and mediastinal contours are within normal limits. Both lungs are clear. The visualized skeletal structures are unremarkable. IMPRESSION: No active cardiopulmonary disease. Electronically Signed   By: Dorise Bullion III M.D   On: 02/22/2021 09:44    Procedures Procedures   Medications Ordered in ED Medications  albuterol (VENTOLIN HFA) 108 (90 Base) MCG/ACT inhaler 2 puff (has no administration in time range)    ED Course  I have reviewed the triage vital signs and the nursing notes.  Pertinent labs & imaging results that were available during my care of the patient were reviewed by me and considered in my medical decision making (see chart for details).    MDM Rules/Calculators/A&P                           MDM  MSE complete  Nakyiah Kuck Penrose was evaluated in Emergency Department on 02/22/2021 for the symptoms described in the history of present illness. She was evaluated in the context of the global COVID-19 pandemic, which necessitated consideration that the patient might be at risk for infection with the SARS-CoV-2 virus that causes COVID-19. Institutional protocols and algorithms that pertain to the evaluation of patients at risk for COVID-19 are in a state of rapid change based on information released by regulatory bodies including the CDC and federal and state organizations. These policies and algorithms were followed during the patient's care in the ED.  Patient is presenting for evaluation of URI symptoms and associated cough.  Work-up today is without significant abnormality.  Patient's chest x-ray is clear.    COVID and strep screens were negative.  Flu screen was also negative.  Patient is appropriate for further outpatient management.  Importance of close follow-up was stressed.  Strict return precautions given and understood.   Final Clinical Impression(s) / ED Diagnoses Final diagnoses:  Upper respiratory tract infection, unspecified type    Rx / DC Orders ED Discharge Orders    None       Valarie Merino, MD 02/22/21 1201

## 2021-05-01 ENCOUNTER — Ambulatory Visit (HOSPITAL_COMMUNITY)
Admission: EM | Admit: 2021-05-01 | Discharge: 2021-05-01 | Disposition: A | Discharge status: Home / Self Care | Payer: Medicaid Other | Attending: Emergency Medicine | Admitting: Emergency Medicine

## 2021-05-01 ENCOUNTER — Other Ambulatory Visit: Payer: Self-pay

## 2021-05-01 ENCOUNTER — Encounter (HOSPITAL_COMMUNITY): Payer: Self-pay | Admitting: Emergency Medicine

## 2021-05-01 DIAGNOSIS — L309 Dermatitis, unspecified: Secondary | ICD-10-CM | POA: Diagnosis not present

## 2021-05-01 MED ORDER — METHYLPREDNISOLONE SODIUM SUCC 125 MG IJ SOLR
80.0000 mg | Freq: Once | INTRAMUSCULAR | Status: AC
  Administered 2021-05-01: 80 mg via INTRAMUSCULAR

## 2021-05-01 MED ORDER — TRIAMCINOLONE ACETONIDE 0.1 % EX CREA: 1.0000 "application " | TOPICAL_CREAM | Freq: Two times a day (BID) | CUTANEOUS | 0 refills | Status: DC

## 2021-05-01 MED ORDER — METHYLPREDNISOLONE SODIUM SUCC 125 MG IJ SOLR
INTRAMUSCULAR | Status: AC
  Filled 2021-05-01: qty 2

## 2021-05-01 MED ORDER — PREDNISONE 20 MG PO TABS: 40.0000 mg | ORAL_TABLET | Freq: Every day | ORAL | 0 refills | Status: AC

## 2021-05-01 NOTE — ED Triage Notes (Signed)
Eczema outbreak across both hands over the last two weeks has continued to worsen. Peeling skin evident on hands bilaterally

## 2021-05-01 NOTE — Discharge Instructions (Addendum)
Start the prednisone daily tomorrow.    Apply the triamcinolone cream twice a day.   Use a good emollient lotion, such as CeraVe, Cetaphil, or Aquaphor.  It is best to apply lotion to wet skin after bathing.  Avoid using hot water when bathing or showering.    Return or go to the Emergency Department if symptoms worsen or do not improve in the next few days.

## 2021-05-01 NOTE — ED Provider Notes (Signed)
[facility]    CSN: [id number] Arrival date & time: 05/01/21  1534      History   Chief Complaint Chief Complaint  Patient presents with   Skin Problem    HPI Vanessa Braun is a 40 y.o. female.   Patient here for evaluation of eczema outbreak on bilateral hands that has been ongoing for the past 2 weeks.  Reports using prescription creams with minimal relief.  Reports outbreak is similar to outbreaks that she has had in the past.  Denies any trauma, injury, or other precipitating event.  Denies any specific alleviating or aggravating factors.  Denies any fevers, chest pain, shortness of breath, N/V/D, numbness, tingling, weakness, abdominal pain, or headaches.    The history is provided by the patient.   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   AMA (advanced maternal age) multigravida 35+, first trimester 06/07/2019   Supervision of high risk pregnancy, antepartum 05/24/2019   Vitamin D deficiency 07/25/2018   History of cesarean delivery 07/21/2018   Chronic hypertension during pregnancy, antepartum 07/21/2018   Previous gastric bypass affecting pregnancy, antepartum 07/21/2018   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   Eczema of both hands 08/16/2014   Obesity 02/11/2012    Past Surgical History:  Procedure Laterality Date   ABDOMINAL HYSTERECTOMY     ADENOIDECTOMY     CESAREAN SECTION N/A 01/30/2019   Procedure:  CESAREAN SECTION;  Surgeon: Caren Macadam, MD;  Location: [facility];  Service: Obstetrics;  Laterality: N/A;   CESAREAN SECTION     2021   DILATION AND CURETTAGE OF UTERUS     DILATION AND EVACUATION N/A 04/01/2014   Procedure: DILATATION AND EVACUATION;  Surgeon: Elveria Royals, MD;  Location: [facility];  Service: Gynecology;  Laterality: N/A;   DILATION AND EVACUATION N/A 12/23/2017   Procedure: DILATATION AND EVACUATION;  Surgeon: Lavonia Drafts, MD;  Location: [facility];  Service: Gynecology;  Laterality: N/A;   GASTRIC BYPASS     TONSILLECTOMY AND ADENOIDECTOMY  1999   TOOTH EXTRACTION N/A 01/30/2021   Procedure: DENTAL RESTORATION/EXTRACTIONS;  Surgeon: Diona Browner, DMD;  Location: [facility];  Service: Oral Surgery;  Laterality: N/A;    OB History     Gravida  5   Para  2   Term  1   Preterm  1   AB  3   Living  2      SAB  3   IAB  0   Ectopic  0   Multiple  0   Live Births  2            Home Medications    Prior to Admission medications   Medication Sig Start Date End Date Taking? Authorizing Provider  predniSONE (DELTASONE) 20 MG tablet Take 2 tablets (40 mg total) by mouth daily for 5  days. 05/01/21 05/06/21 Yes Pearson Forster, NP  triamcinolone cream (KENALOG) 0.1 % Apply 1 application topically 2 (two) times daily. 05/01/21  Yes Pearson Forster, NP  acetaminophen (TYLENOL) 325 MG tablet Take 325 mg by mouth every 6 (six) hours as needed for moderate pain.    [provider]  Blood Pressure Monitoring (BLOOD PRESSURE KIT) DEVI 1 Device by Does not apply route See admin instructions. Blood pressure cuff 05/24/19   Aletha Halim, MD  cholecalciferol (VITAMIN D3) 25 MCG (1000 UNIT) tablet Take 1,000 Units by mouth daily.    [provider]  Clobetasol Prop Emollient Base 0.05 % emollient cream Apply 1 application topically 2 (two) times daily. Avoid contact with face. Patient taking differently: Apply 1 application topically 2 (two)  times daily as needed (hands). Avoid contact with face. 12/31/20   Sharion Balloon, NP  Cyanocobalamin (B-12) 1000 MCG SUBL Place 1,000 mcg under the tongue daily. Liquid    [provider]  ferrous sulfate 325 (65 FE) MG tablet Take 325 mg by mouth daily.    [provider]  folic acid (FOLVITE) 1 MG tablet Take 1 tablet (1 mg total) by mouth daily. 07/05/19   Aletha Halim, MD  HYDROcodone-acetaminophen (NORCO) 5-325 MG tablet Take 1 tablet by mouth every 6 (six) hours as needed for moderate pain. 01/30/21   Diona Browner, DMD  Multiple Vitamins-Minerals (MULTIVITAMIN WITH MINERALS) tablet Take 1 tablet by mouth daily.    [provider]    Family History Family History  Problem Relation Age of Onset   Diabetes Mother    Hyperlipidemia Mother    Hypertension Mother    Diabetes Father    Kidney disease Father    Hypertension Father    Hyperlipidemia Father    Cancer Father        pancreatic   Gestational diabetes Sister    Depression Sister    Diabetes Sister    Heart disease Maternal Grandmother    Heart disease Paternal Grandmother     Social History Social History   Tobacco Use   Smoking status: Never   Smokeless tobacco: Never  Vaping Use   Vaping Use: Never used  Substance Use Topics   Alcohol use: Yes    Comment: occasional beer   Drug use: No     Allergies   Aspirin, Other, and Penicillins   Review of Systems Review of Systems  Skin:  Positive for rash.  All other systems reviewed and are negative.   Physical Exam Triage Vital Signs ED Triage Vitals  Enc Vitals Group     BP 05/01/21 1615 134/90     Pulse Rate 05/01/21 1615 61     Resp 05/01/21 1615 14     Temp 05/01/21 1615 98.1 F (36.7 C)     Temp Source 05/01/21 1615 Oral     SpO2 05/01/21 1615 99 %     Weight --      Height --      Head Circumference --      Peak Flow --      Pain Score 05/01/21 1616 9     Pain Loc --      Pain Edu? --      Excl. in [place]? --     No data found.  Updated Vital Signs BP 134/90 (BP Location: Left Arm)   Pulse 61   Temp 98.1 F (36.7 C) (Oral)   Resp 14   LMP 03/14/2019 (Within  Days)   SpO2 99%   Visual Acuity Right Eye Distance:   Left Eye Distance:   Bilateral Distance:    Right Eye Near:   Left Eye Near:    Bilateral Near:     Physical Exam Vitals and nursing note reviewed.  Constitutional:      General: She is not in acute distress.    Appearance: Normal appearance. She is not ill-appearing, toxic-appearing or diaphoretic.  HENT:     Head: Normocephalic and atraumatic.  Eyes:     Conjunctiva/sclera: Conjunctivae normal.  Cardiovascular:     Rate and Rhythm: Normal rate.     Pulses: Normal pulses.  Pulmonary:     Effort: Pulmonary effort is normal.  Abdominal:     General: Abdomen is flat.  Musculoskeletal:        General: Normal range of motion.     Cervical back: Normal range of motion.  Skin:    General: Skin is warm and dry.     Findings: Rash (see photo below) present.  Neurological:     General: No focal deficit present.     Mental Status: She is alert and oriented to person, place, and time.  Psychiatric:        Mood and Affect: Mood normal.      UC Treatments / Results  Labs (all labs ordered are listed, but only abnormal results are displayed) Labs Reviewed - No data to display  EKG   Radiology No results found.  Procedures Procedures (including critical care time)  Medications Ordered in UC Medications  methylPREDNISolone sodium succinate (SOLU-MEDROL) 125 mg/2 mL injection 80 mg (has no administration in time range)    Initial Impression / Assessment and Plan / UC Course  I have reviewed the triage vital signs and the nursing notes.  Pertinent labs & imaging results that were available during my care of the patient were reviewed by me and considered in my medical decision making (see chart for details).    Assessment negative for red flags or concerns.   Solu-Medrol administered IM in office as patient requested it stating that it has helped in the past.  Will prescribe prednisone daily for the next 5 days starting tomorrow.  Apply triamcinolone cream twice a day as needed.  May continue to use other prescription creams and ointments as needed.  Recommend using emollient lotion such as CeraVe, Cetaphil, or Aquaphor.  Avoid using hot water when bathing or showering and apply lotions to wet skin.  Follow-up for any worsening symptoms.  Final Clinical Impressions(s) / UC Diagnoses   Final diagnoses:  Eczema of hand     Discharge Instructions      Start the prednisone daily tomorrow.    Apply the triamcinolone cream twice a day.   Use a good emollient lotion, such as CeraVe, Cetaphil, or Aquaphor.  It is best to apply lotion to wet skin after bathing.  Avoid using hot water when bathing or showering.    Return or go to the Emergency Department if symptoms worsen or do not improve in the next few days.      ED Prescriptions     Medication Sig Dispense Auth. Provider   predniSONE (DELTASONE) 20 MG tablet Take 2 tablets (40 mg total) by mouth daily for 5 days. 10 tablet Pearson Forster, NP   triamcinolone cream (KENALOG) 0.1 % Apply 1 application topically 2 (two) times daily. 30 g Pearson Forster, NP      PDMP not  reviewed this encounter.   Pearson Forster, NP 05/01/21 [phone]

## 2021-08-27 ENCOUNTER — Ambulatory Visit (HOSPITAL_COMMUNITY)
Admission: EM | Admit: 2021-08-27 | Discharge: 2021-08-27 | Disposition: A | Discharge status: Home / Self Care | Payer: Medicaid Other | Attending: Emergency Medicine | Admitting: Emergency Medicine

## 2021-08-27 ENCOUNTER — Other Ambulatory Visit: Payer: Self-pay

## 2021-08-27 ENCOUNTER — Encounter (HOSPITAL_COMMUNITY): Payer: Self-pay

## 2021-08-27 DIAGNOSIS — L80 Vitiligo: Secondary | ICD-10-CM | POA: Diagnosis not present

## 2021-08-27 DIAGNOSIS — L309 Dermatitis, unspecified: Secondary | ICD-10-CM | POA: Diagnosis not present

## 2021-08-27 DIAGNOSIS — L739 Follicular disorder, unspecified: Secondary | ICD-10-CM | POA: Diagnosis not present

## 2021-08-27 MED ORDER — DOXYCYCLINE HYCLATE 100 MG PO CAPS: 100.0000 mg | ORAL_CAPSULE | Freq: Two times a day (BID) | ORAL | 0 refills | Status: AC

## 2021-08-27 MED ORDER — CLOBETASOL PROP EMOLLIENT BASE 0.05 % EX CREA: 1.0000 "application " | TOPICAL_CREAM | Freq: Two times a day (BID) | CUTANEOUS | 0 refills | Status: DC

## 2021-08-27 NOTE — ED Provider Notes (Signed)
[facility]    CSN: [id number] Arrival date & time: 08/27/21  1904      History   Chief Complaint Chief Complaint  Patient presents with   Eczema    HPI Vanessa Braun is a 40 y.o. female.   Patient reports a history of eczema on her hands, states her symptoms of flared up and she needs refills of her steroid ointment.  Patient also complains of a bump on her chin, states it was a pimple, states she attempted to pop it with little success however now the bump is very red very swollen and very painful to the touch.  Patient is requesting medication for this.  Finally, patient complains of discoloration of the skin around her neck on both sides, patient states she is been referred to a dermatologist however the dermatologist was in [place] and she does not drive that far.  The history is provided by the patient.   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   AMA (advanced maternal age) multigravida 35+, first trimester 06/07/2019   Supervision of high risk pregnancy, antepartum 05/24/2019   Vitamin D deficiency 07/25/2018   History of cesarean delivery 07/21/2018   Chronic hypertension during pregnancy, antepartum 07/21/2018   Previous gastric bypass affecting pregnancy, antepartum 07/21/2018   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   Eczema of both hands 08/16/2014   Obesity 02/11/2012    Past Surgical History:  Procedure  Laterality Date   ABDOMINAL HYSTERECTOMY     ADENOIDECTOMY     CESAREAN SECTION N/A 01/30/2019   Procedure: CESAREAN SECTION;  Surgeon: Caren Macadam, MD;  [facility];  Service: Obstetrics;  Laterality: N/A;   CESAREAN SECTION     2021   DILATION AND CURETTAGE OF UTERUS     DILATION AND EVACUATION N/A 04/01/2014   Procedure: DILATATION AND EVACUATION;  Surgeon: Elveria Royals, MD;  Location: [facility];  Service: Gynecology;  Laterality: N/A;   DILATION AND EVACUATION N/A 12/23/2017   Procedure: DILATATION AND EVACUATION;  Surgeon: Lavonia Drafts, MD;  Location: [facility];  Service: Gynecology;  Laterality: N/A;   GASTRIC BYPASS     TONSILLECTOMY AND ADENOIDECTOMY  1999   TOOTH EXTRACTION N/A 01/30/2021   Procedure: DENTAL RESTORATION/EXTRACTIONS;  Surgeon: Diona Browner, DMD;  Location: [facility];  Service: Oral Surgery;  Laterality: N/A;    OB History     Gravida  5   Para  2   Term  1   Preterm  1   AB  3   Living  2      SAB  3   IAB  0   Ectopic  0   Multiple  0   Live Births  2            Home Medications    Prior to Admission medications  Medication Sig Start Date End Date Taking? Authorizing Provider  doxycycline (VIBRAMYCIN) 100 MG capsule Take 1 capsule (100 mg total) by mouth 2 (two) times daily for 14 days. 08/27/21 09/10/21 Yes Lynden Oxford Scales, PA-C  acetaminophen (TYLENOL) 325 MG tablet Take 325 mg by mouth every 6 (six) hours as needed for moderate pain.    [provider]  Blood Pressure Monitoring (BLOOD PRESSURE KIT) DEVI 1 Device by Does not apply route See admin instructions. Blood pressure cuff 05/24/19   Aletha Halim, MD  cholecalciferol (VITAMIN D3) 25 MCG (1000 UNIT) tablet Take 1,000 Units by mouth daily.    [provider]  Clobetasol Prop Emollient Base 0.05 % emollient cream Apply 1 application topically 2 (two) times daily. Avoid contact with face. 08/27/21   Lynden Oxford Scales, PA-C   Cyanocobalamin (B-12) 1000 MCG SUBL Place 1,000 mcg under the tongue daily. Liquid    [provider]  ferrous sulfate 325 (65 FE) MG tablet Take 325 mg by mouth daily.    [provider]  folic acid (FOLVITE) 1 MG tablet Take 1 tablet (1 mg total) by mouth daily. 07/05/19   Aletha Halim, MD  HYDROcodone-acetaminophen (NORCO) 5-325 MG tablet Take 1 tablet by mouth every 6 (six) hours as needed for moderate pain. 01/30/21   Diona Browner, DMD  Multiple Vitamins-Minerals (MULTIVITAMIN WITH MINERALS) tablet Take 1 tablet by mouth daily.    [provider]  triamcinolone cream (KENALOG) 0.1 % Apply 1 application topically 2 (two) times daily. 05/01/21   Pearson Forster, NP    Family History Family History  Problem Relation Age of Onset   Diabetes Mother    Hyperlipidemia Mother    Hypertension Mother    Diabetes Father    Kidney disease Father    Hypertension Father    Hyperlipidemia Father    Cancer Father        pancreatic   Gestational diabetes Sister    Depression Sister    Diabetes Sister    Heart disease Maternal Grandmother    Heart disease Paternal Grandmother     Social History Social History   Tobacco Use   Smoking status: Never   Smokeless tobacco: Never  Vaping Use   Vaping Use: Never used  Substance Use Topics   Alcohol use: Yes    Comment: occasional beer   Drug use: No     Allergies   Aspirin, Other, and Penicillins   Review of Systems Review of Systems Pertinent findings noted in history of present illness.    Physical Exam Triage Vital Signs ED Triage Vitals  Enc Vitals Group     BP 08/21/21 0827 (!) 147/82     Pulse Rate 08/21/21 0827 72     Resp 08/21/21 0827 18     Temp 08/21/21 0827 98.3 F (36.8 C)     Temp Source 08/21/21 0827 Oral     SpO2 08/21/21 0827 98 %     Weight --      Height --      Head Circumference --      Peak Flow --      Pain Score 08/21/21 0826 5     Pain Loc --      Pain Edu? --       Excl. in [place]? --    No data found.  Updated Vital Signs BP 119/83 (BP Location: Right Arm)   Pulse 60   Temp 97.9 F (36.6 C) (Oral)  Resp 19   LMP 03/14/2019 (Within Days)   SpO2 98%   Visual Acuity Right Eye Distance:   Left Eye Distance:   Bilateral Distance:    Right Eye Near:   Left Eye Near:    Bilateral Near:     Physical Exam Vitals and nursing note reviewed.  Constitutional:      General: She is not in acute distress.    Appearance: Normal appearance. She is not ill-appearing.  HENT:     Head: Normocephalic and atraumatic.  Eyes:     General: Lids are normal.        Right eye: No discharge.        Left eye: No discharge.     Extraocular Movements: Extraocular movements intact.     Conjunctiva/sclera: Conjunctivae normal.     Right eye: Right conjunctiva is not injected.     Left eye: Left conjunctiva is not injected.  Neck:     Trachea: Trachea and phonation normal.  Cardiovascular:     Rate and Rhythm: Normal rate and regular rhythm.     Pulses: Normal pulses.     Heart sounds: Normal heart sounds. No murmur heard.   No friction rub. No gallop.  Pulmonary:     Effort: Pulmonary effort is normal. No accessory muscle usage, prolonged expiration or respiratory distress.     Breath sounds: Normal breath sounds. No stridor, decreased air movement or transmitted upper airway sounds. No decreased breath sounds, wheezing, rhonchi or rales.  Chest:     Chest wall: No tenderness.  Musculoskeletal:        General: Normal range of motion.     Cervical back: Normal range of motion and neck supple. Normal range of motion.  Lymphadenopathy:     Cervical: No cervical adenopathy.  Skin:    General: Skin is warm and dry.     Findings: No erythema or rash.     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  Inflamed, comedone in center chin.  Vitiligo both sides of neck  Neurological:      General: No focal deficit present.     Mental Status: She is alert and oriented to person, place, and time.  Psychiatric:        Mood and Affect: Mood normal.        Behavior: Behavior normal.     UC Treatments / Results  Labs (all labs ordered are listed, but only abnormal results are displayed) Labs Reviewed - No data to display  EKG   Radiology No results found.  Procedures Procedures (including critical care time)  Medications Ordered in UC Medications - No data to display  Initial Impression / Assessment and Plan / UC Course  I have reviewed the triage vital signs and the nursing notes.  Pertinent labs & imaging results that were available during my care of the patient were reviewed by me and considered in my medical decision making (see chart for details).     Patient provided with contact information for to dermatology groups in the [place] area.  I have renewed her prescription for clobetasol emollient and advised her to use this with Eucerin twice daily consistently.  I also provided her with a prescription for doxycycline to address the inflamed pimple on her chin.  Patient advised to take until resolved and discontinue.  Patient verbalized understanding and agreement of plan as discussed.  All questions were addressed  during visit.  Please see discharge instructions below for further details of plan.  Final Clinical Impressions(s) / UC Diagnoses   Final diagnoses:  Eczema, unspecified type  Vitiligo  Folliculitis     Discharge Instructions      For the bump in your chin please begin doxycycline 1 tablet twice daily until resolved.  I renewed your prescription for clobetasol emollient-based [place] cream, please use twice daily consistently and topped with Eucerin original healing cream.  I have provided you with the name of 2 dermatologist in the area that she can contact for appointment.     ED Prescriptions     Medication Sig Dispense Auth.  Provider   Clobetasol Prop Emollient Base 0.05 % emollient cream Apply 1 application topically 2 (two) times daily. Avoid contact with face. 30 g Lynden Oxford Scales, PA-C   doxycycline (VIBRAMYCIN) 100 MG capsule Take 1 capsule (100 mg total) by mouth 2 (two) times daily for 14 days. 28 capsule Lynden Oxford Scales, PA-C      PDMP not reviewed this encounter.    Lynden Oxford Scales, PA-C 08/27/21 2121

## 2021-08-27 NOTE — Discharge Instructions (Signed)
For the bump in your chin please begin doxycycline 1 tablet twice daily until resolved.  I renewed your prescription for clobetasol emollient-based [place] cream, please use twice daily consistently and topped with Eucerin original healing cream.  I have provided you with the name of 2 dermatologist in the area that she can contact for appointment.

## 2021-08-27 NOTE — ED Triage Notes (Signed)
Pt presents with c/o bump on her chin and eczema flare up.

## 2021-11-13 ENCOUNTER — Other Ambulatory Visit: Payer: Self-pay

## 2021-11-13 ENCOUNTER — Ambulatory Visit (HOSPITAL_COMMUNITY)
Admission: EM | Admit: 2021-11-13 | Discharge: 2021-11-13 | Disposition: A | Discharge status: Home / Self Care | Payer: Medicaid Other | Attending: Student | Admitting: Student

## 2021-11-13 DIAGNOSIS — Z90710 Acquired absence of both cervix and uterus: Secondary | ICD-10-CM | POA: Diagnosis not present

## 2021-11-13 DIAGNOSIS — L309 Dermatitis, unspecified: Secondary | ICD-10-CM | POA: Diagnosis not present

## 2021-11-13 MED ORDER — HYDROXYZINE HCL 25 MG PO TABS: 25.0000 mg | ORAL_TABLET | Freq: Three times a day (TID) | ORAL | 0 refills | Status: DC | PRN

## 2021-11-13 MED ORDER — TRIAMCINOLONE ACETONIDE 0.1 % EX CREA: 1.0000 "application " | TOPICAL_CREAM | Freq: Two times a day (BID) | CUTANEOUS | 0 refills | Status: AC

## 2021-11-13 NOTE — Discharge Instructions (Addendum)
-  Triamcionlone cream 2x daily while symptoms persist. This can cause further skin lightening.  -Hydroxyzine as needed for itching, this will make you sleepy

## 2021-11-13 NOTE — ED Provider Notes (Signed)
[name]    CSN: [id number] Arrival date & time: 11/13/21  1531      History   Chief Complaint Chief Complaint  Patient presents with   Rash    HPI Vanessa Braun is a 41 y.o. female  presenting with eczema. History eczema.  Describes itchy rash on her anterior chest wall for 2 days.  Has not tried interventions at home.  Denies lesion elsewhere.  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   AMA (advanced maternal age) multigravida 22+, first trimester 06/07/2019   Supervision of high risk pregnancy, antepartum 05/24/2019   Vitamin D deficiency 07/25/2018   History of cesarean delivery 07/21/2018   Chronic hypertension during pregnancy, antepartum 07/21/2018   Previous gastric bypass affecting pregnancy, antepartum 07/21/2018   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   Eczema of both hands 08/16/2014   Obesity 02/11/2012    Past Surgical History:  Procedure Laterality Date   ABDOMINAL HYSTERECTOMY     ADENOIDECTOMY     CESAREAN SECTION N/A 01/30/2019   Procedure: CESAREAN SECTION;  Surgeon: Caren Macadam, MD;  Location: [facility];  Service: Obstetrics;  Laterality: N/A;   CESAREAN SECTION     2021   DILATION AND CURETTAGE OF UTERUS     DILATION AND EVACUATION N/A 04/01/2014   Procedure: DILATATION AND EVACUATION;  Surgeon: Elveria Royals, MD;  Location: [facility];  Service: Gynecology;  Laterality: N/A;    DILATION AND EVACUATION N/A 12/23/2017   Procedure: DILATATION AND EVACUATION;  Surgeon: Lavonia Drafts, MD;  Location: [facility];  Service: Gynecology;  Laterality: N/A;   GASTRIC BYPASS     TONSILLECTOMY AND ADENOIDECTOMY  1999   TOOTH EXTRACTION N/A 01/30/2021   Procedure: DENTAL RESTORATION/EXTRACTIONS;  Surgeon: Diona Browner, DMD;  Location: [facility];  Service: Oral Surgery;  Laterality: N/A;    OB History     Gravida  5   Para  2   Term  1   Preterm  1   AB  3   Living  2      SAB  3   IAB  0   Ectopic  0   Multiple  0   Live Births  2            Home Medications    Prior to Admission medications   Medication Sig Start Date End Date Taking? Authorizing Provider  hydrOXYzine (ATARAX) 25 MG tablet Take 1 tablet (25 mg total) by mouth every 8 (eight) hours as needed. 11/13/21  Yes Hazel Sams, PA-C  acetaminophen (TYLENOL) 325 MG tablet Take 325 mg by mouth every 6 (six) hours as needed for moderate pain.    [provider]  Blood Pressure Monitoring (BLOOD PRESSURE KIT) DEVI 1 Device by Does not apply route See admin instructions. Blood pressure cuff 05/24/19  Aletha Halim, MD  cholecalciferol (VITAMIN D3) 25 MCG (1000 UNIT) tablet Take 1,000 Units by mouth daily.    [provider]  Clobetasol Prop Emollient Base 0.05 % emollient cream Apply 1 application topically 2 (two) times daily. Avoid contact with face. 08/27/21   Lynden Oxford Scales, PA-C  Cyanocobalamin (B-12) 1000 MCG SUBL Place 1,000 mcg under the tongue daily. Liquid    [provider]  ferrous sulfate 325 (65 FE) MG tablet Take 325 mg by mouth daily.    [provider]  folic acid (FOLVITE) 1 MG tablet Take 1 tablet (1 mg total) by mouth daily. 07/05/19   Aletha Halim, MD  HYDROcodone-acetaminophen (NORCO) 5-325 MG tablet Take 1 tablet by mouth every 6 (six) hours as needed for moderate pain. 01/30/21   Diona Browner, DMD  Multiple Vitamins-Minerals  (MULTIVITAMIN WITH MINERALS) tablet Take 1 tablet by mouth daily.    [provider]  triamcinolone cream (KENALOG) 0.1 % Apply 1 application topically 2 (two) times daily for 7 days. 11/13/21 11/20/21  Hazel Sams, PA-C    Family History Family History  Problem Relation Age of Onset   Diabetes Mother    Hyperlipidemia Mother    Hypertension Mother    Diabetes Father    Kidney disease Father    Hypertension Father    Hyperlipidemia Father    Cancer Father        pancreatic   Gestational diabetes Sister    Depression Sister    Diabetes Sister    Heart disease Maternal Grandmother    Heart disease Paternal Grandmother     Social History Social History   Tobacco Use   Smoking status: Never   Smokeless tobacco: Never  Vaping Use   Vaping Use: Never used  Substance Use Topics   Alcohol use: Yes    Comment: occasional beer   Drug use: No     Allergies   Aspirin, Other, and Penicillins   Review of Systems Review of Systems  Skin:  Positive for rash.  All other systems reviewed and are negative.   Physical Exam Triage Vital Signs ED Triage Vitals  Enc Vitals Group     BP      Pulse      Resp      Temp      Temp src      SpO2      Weight      Height      Head Circumference      Peak Flow      Pain Score      Pain Loc      Pain Edu?      Excl. in [place]?    No data found.  Updated Vital Signs BP 131/85 (BP Location: Left Arm)    Pulse 76    Temp 98.1 F (36.7 C) (Oral)    Resp 16    LMP 03/14/2019 (Within Days)    SpO2 100%   Visual Acuity Right Eye Distance:   Left Eye Distance:   Bilateral Distance:    Right Eye Near:   Left Eye Near:    Bilateral Near:     Physical Exam Vitals reviewed.  Constitutional:      General: She is not in acute distress.    Appearance: Normal appearance. She is not ill-appearing.  HENT:     Head: Normocephalic and atraumatic.  Pulmonary:     Effort: Pulmonary effort is normal.  Skin:  Comments:  Anterior chest wall - erythematous with some scaling. Scattered hypopigmented patches on arms, posterior neck.   Neurological:     General: No focal deficit present.     Mental Status: She is alert and oriented to person, place, and time.  Psychiatric:        Mood and Affect: Mood normal.        Behavior: Behavior normal.        Thought Content: Thought content normal.        Judgment: Judgment normal.     UC Treatments / Results  Labs (all labs ordered are listed, but only abnormal results are displayed) Labs Reviewed - No data to display  EKG   Radiology No results found.  Procedures Procedures (including critical care time)  Medications Ordered in UC Medications - No data to display  Initial Impression / Assessment and Plan / UC Course  I have reviewed the triage vital signs and the nursing notes.  Pertinent labs & imaging results that were available during my care of the patient were reviewed by me and considered in my medical decision making (see chart for details).     This patient is a very pleasant 41 y.o. year old female presenting with eczema. Afebrile, nontachy. Triaminolone sent. Hydroxyzine sent at patient request. She understands that the triamcinolone will cause some skin lightening; she already has patches of this throughout her body. ED return precautions discussed. Patient verbalizes understanding and agreement. Hysterectomy contraception, she is not breastfeeding.  Final Clinical Impressions(s) / UC Diagnoses   Final diagnoses:  Eczema, unspecified type  History of hysterectomy     Discharge Instructions      -Triamcionlone cream 2x daily while symptoms persist. This can cause further skin lightening.  -Hydroxyzine as needed for itching, this will make you sleepy     ED Prescriptions     Medication Sig Dispense Auth. Provider   triamcinolone cream (KENALOG) 0.1 % Apply 1 application topically 2 (two) times daily for 7 days. 30 g Hazel Sams, PA-C   hydrOXYzine (ATARAX) 25 MG tablet Take 1 tablet (25 mg total) by mouth every 8 (eight) hours as needed. 21 tablet Hazel Sams, PA-C      PDMP not reviewed this encounter.   Hazel Sams, PA-C 11/13/21 1554

## 2021-11-13 NOTE — ED Triage Notes (Signed)
Pt c/o rash on neck x 2 days. She thinks its eczema flare-ups.

## 2021-11-23 ENCOUNTER — Ambulatory Visit (HOSPITAL_COMMUNITY)
Admission: EM | Admit: 2021-11-23 | Discharge: 2021-11-23 | Disposition: A | Discharge status: Home / Self Care | Payer: Medicaid Other | Attending: Internal Medicine | Admitting: Internal Medicine

## 2021-11-23 ENCOUNTER — Encounter (HOSPITAL_COMMUNITY): Payer: Self-pay

## 2021-11-23 ENCOUNTER — Other Ambulatory Visit: Payer: Self-pay

## 2021-11-23 DIAGNOSIS — L0102 Bockhart's impetigo: Secondary | ICD-10-CM

## 2021-11-23 DIAGNOSIS — L739 Follicular disorder, unspecified: Secondary | ICD-10-CM

## 2021-11-23 MED ORDER — CEPHALEXIN 500 MG PO CAPS: 500.0000 mg | ORAL_CAPSULE | Freq: Three times a day (TID) | ORAL | 0 refills | Status: AC

## 2021-11-23 NOTE — Discharge Instructions (Signed)
Sitz bath's recommended Take medications as prescribed If symptoms worsen please return to urgent care to be reevaluated There is no drainable abscess at this time.

## 2021-11-23 NOTE — ED Provider Notes (Signed)
[facility]    CSN: [id number] Arrival date & time: 11/23/21  1304      History   Chief Complaint Chief Complaint  Patient presents with   Abscess    HPI Vanessa Braun is a 41 y.o. female comes to the urgent care with a 2-week history of perianal pain.  Patient says symptoms started after she shaved the area.  Pain is of moderate severity.  She denies any drainage.  No fever or chills.  Over the past couple of days the pain is worse and has a visit to the urgent care.   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   AMA (advanced maternal age) multigravida 29+, first trimester 06/07/2019   Supervision of high risk pregnancy, antepartum 05/24/2019   Vitamin D deficiency 07/25/2018   History of cesarean delivery 07/21/2018   Chronic hypertension during pregnancy, antepartum 07/21/2018   Previous gastric bypass affecting pregnancy, antepartum 07/21/2018   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   Eczema of both hands 08/16/2014   Obesity 02/11/2012    Past Surgical History:  Procedure Laterality Date   ABDOMINAL HYSTERECTOMY     ADENOIDECTOMY     CESAREAN SECTION N/A 01/30/2019   Procedure: CESAREAN SECTION;  Surgeon: Caren Macadam, MD;  Location: [facility];  Service: Obstetrics;  Laterality: N/A;   CESAREAN SECTION     2021   DILATION AND CURETTAGE OF UTERUS     DILATION AND EVACUATION N/A 04/01/2014   Procedure:  DILATATION AND EVACUATION;  Surgeon: Elveria Royals, MD;  Location: [facility];  Service: Gynecology;  Laterality: N/A;   DILATION AND EVACUATION N/A 12/23/2017   Procedure: DILATATION AND EVACUATION;  Surgeon: Lavonia Drafts, MD;  Location: [facility];  Service: Gynecology;  Laterality: N/A;   GASTRIC BYPASS     TONSILLECTOMY AND ADENOIDECTOMY  1999   TOOTH EXTRACTION N/A 01/30/2021   Procedure: DENTAL RESTORATION/EXTRACTIONS;  Surgeon: Diona Browner, DMD;  Location: [facility];  Service: Oral Surgery;  Laterality: N/A;    OB History     Gravida  5   Para  2   Term  1   Preterm  1   AB  3   Living  2      SAB  3   IAB  0   Ectopic  0   Multiple  0   Live Births  2            Home Medications    Prior to Admission medications   Medication Sig Start Date End Date Taking? Authorizing Provider  cephALEXin (KEFLEX) 500 MG capsule Take 1 capsule (500 mg total) by mouth 3 (three) times daily for 7 days. 11/23/21 11/30/21 Yes Brevyn Ring, Myrene Galas, MD  acetaminophen (TYLENOL) 325 MG tablet Take 325 mg by mouth every 6 (six) hours as needed for  moderate pain.    [provider]  Blood Pressure Monitoring (BLOOD PRESSURE KIT) DEVI 1 Device by Does not apply route See admin instructions. Blood pressure cuff 05/24/19   Aletha Halim, MD  cholecalciferol (VITAMIN D3) 25 MCG (1000 UNIT) tablet Take 1,000 Units by mouth daily.    [provider]  Clobetasol Prop Emollient Base 0.05 % emollient cream Apply 1 application topically 2 (two) times daily. Avoid contact with face. 08/27/21   Lynden Oxford Scales, PA-C  Cyanocobalamin (B-12) 1000 MCG SUBL Place 1,000 mcg under the tongue daily. Liquid    [provider]  ferrous sulfate 325 (65 FE) MG tablet Take 325 mg by mouth daily.    [provider]  folic acid (FOLVITE) 1 MG tablet Take 1 tablet (1 mg total) by mouth daily. 07/05/19   Aletha Halim, MD  HYDROcodone-acetaminophen (NORCO) 5-325 MG tablet  Take 1 tablet by mouth every 6 (six) hours as needed for moderate pain. 01/30/21   Diona Browner, DMD  hydrOXYzine (ATARAX) 25 MG tablet Take 1 tablet (25 mg total) by mouth every 8 (eight) hours as needed. 11/13/21   Hazel Sams, PA-C  Multiple Vitamins-Minerals (MULTIVITAMIN WITH MINERALS) tablet Take 1 tablet by mouth daily.    [provider]    Family History Family History  Problem Relation Age of Onset   Diabetes Mother    Hyperlipidemia Mother    Hypertension Mother    Diabetes Father    Kidney disease Father    Hypertension Father    Hyperlipidemia Father    Cancer Father        pancreatic   Gestational diabetes Sister    Depression Sister    Diabetes Sister    Heart disease Maternal Grandmother    Heart disease Paternal Grandmother     Social History Social History   Tobacco Use   Smoking status: Never   Smokeless tobacco: Never  Vaping Use   Vaping Use: Never used  Substance Use Topics   Alcohol use: Yes    Comment: occasional beer   Drug use: No     Allergies   Aspirin, Other, and Penicillins   Review of Systems Review of Systems  Gastrointestinal: Negative.   Musculoskeletal: Negative.   Skin:  Positive for color change and rash. Negative for pallor and wound.    Physical Exam Triage Vital Signs ED Triage Vitals  Enc Vitals Group     BP 11/23/21 1448 125/83     Pulse Rate 11/23/21 1446 75     Resp 11/23/21 1446 18     Temp 11/23/21 1446 98.4 F (36.9 C)     Temp Source 11/23/21 1446 Oral     SpO2 11/23/21 1446 100 %     Weight --      Height --      Head Circumference --      Peak Flow --      Pain Score 11/23/21 1446 9     Pain Loc --      Pain Edu? --      Excl. in [place]? --    No data found.  Updated Vital Signs BP 125/83    Pulse 75    Temp 98.4 F (36.9 C) (Oral)    Resp 18    LMP 03/14/2019 (Within Days)    SpO2 100%   Visual Acuity Right Eye Distance:   Left Eye Distance:   Bilateral Distance:    Right Eye  Near:  Left Eye Near:    Bilateral Near:     Physical Exam Vitals and nursing note reviewed.  Constitutional:      General: She is not in acute distress.    Appearance: She is not ill-appearing.  Cardiovascular:     Rate and Rhythm: Normal rate and regular rhythm.  Genitourinary:    Comments: Two tender nodules in the perianal region.  No surrounding erythema.  No fluctuance. Neurological:     Mental Status: She is alert.     UC Treatments / Results  Labs (all labs ordered are listed, but only abnormal results are displayed) Labs Reviewed - No data to display  EKG   Radiology No results found.  Procedures Procedures (including critical care time)  Medications Ordered in UC Medications - No data to display  Initial Impression / Assessment and Plan / UC Course  I have reviewed the triage vital signs and the nursing notes.  Pertinent labs & imaging results that were available during my care of the patient were reviewed by me and considered in my medical decision making (see chart for details).     1.  Folliculitis: Sitz bath Keflex 500 mg 3 times daily for 7 days Ibuprofen as needed Tylenol as needed for pain/or fever No drainable abscess at this time Return to urgent care if symptoms worsen. Final Clinical Impressions(s) / UC Diagnoses   Final diagnoses:  Folliculitis and perifolliculitis     Discharge Instructions      Sitz bath's recommended Take medications as prescribed If symptoms worsen please return to urgent care to be reevaluated There is no drainable abscess at this time.   ED Prescriptions     Medication Sig Dispense Auth. Provider   cephALEXin (KEFLEX) 500 MG capsule Take 1 capsule (500 mg total) by mouth 3 (three) times daily for 7 days. 21 capsule Avon Molock, Myrene Galas, MD      PDMP not reviewed this encounter.   Chase Picket, MD 11/23/21 [phone]

## 2021-11-23 NOTE — ED Triage Notes (Signed)
Pt presents with abscess on right buttocks for over a week.

## 2021-12-11 ENCOUNTER — Encounter (HOSPITAL_COMMUNITY): Payer: Self-pay

## 2021-12-11 ENCOUNTER — Other Ambulatory Visit: Payer: Self-pay

## 2021-12-11 ENCOUNTER — Ambulatory Visit (HOSPITAL_COMMUNITY)
Admission: EM | Admit: 2021-12-11 | Discharge: 2021-12-11 | Disposition: A | Discharge status: Home / Self Care | Payer: Medicaid Other | Attending: Student | Admitting: Student

## 2021-12-11 DIAGNOSIS — L03011 Cellulitis of right finger: Secondary | ICD-10-CM

## 2021-12-11 DIAGNOSIS — L309 Dermatitis, unspecified: Secondary | ICD-10-CM

## 2021-12-11 DIAGNOSIS — Z90710 Acquired absence of both cervix and uterus: Secondary | ICD-10-CM

## 2021-12-11 DIAGNOSIS — Z880 Allergy status to penicillin: Secondary | ICD-10-CM

## 2021-12-11 MED ORDER — METHYLPREDNISOLONE SODIUM SUCC 125 MG IJ SOLR
INTRAMUSCULAR | Status: AC
  Filled 2021-12-11: qty 2

## 2021-12-11 MED ORDER — METHYLPREDNISOLONE SODIUM SUCC 125 MG IJ SOLR
60.0000 mg | Freq: Once | INTRAMUSCULAR | Status: AC
  Administered 2021-12-11: 60 mg via INTRAMUSCULAR

## 2021-12-11 MED ORDER — DOXYCYCLINE HYCLATE 100 MG PO CAPS: 100.0000 mg | ORAL_CAPSULE | Freq: Two times a day (BID) | ORAL | 0 refills | Status: AC

## 2021-12-11 MED ORDER — PREDNISONE 20 MG PO TABS: 20.0000 mg | ORAL_TABLET | Freq: Every day | ORAL | 0 refills | Status: AC

## 2021-12-11 NOTE — ED Provider Notes (Signed)
[place]    CSN: [id number] Arrival date & time: 12/11/21  0932      History   Chief Complaint Chief Complaint  Patient presents with   Recurrent Skin Infections    HPI Vanessa Braun is a 41 y.o. female presenting with concern for infection of the chronic hand dermatitis.  Medical history chronic hand dermatitis/eczema, hysterectomy.  States that she got a small cut on the right index finger with a eyebrow tool about 1 week ago.  Since then the skin has gotten painful and red surrounding the area, with some radiation into the interdigital web.  States she has been using triamcinolone cream for the eczema, this is providing minimal relief, but she is not able to see dermatology until 6/23.  Denies discharge, fever/chills.  She is right-handed.  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   AMA (advanced maternal age) multigravida 27+, first trimester 06/07/2019   Supervision of high risk pregnancy, antepartum 05/24/2019   Vitamin D deficiency 07/25/2018   History of cesarean delivery 07/21/2018   Chronic hypertension during pregnancy, antepartum 07/21/2018   Previous gastric bypass affecting pregnancy, antepartum 07/21/2018   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   Eczema of both hands 08/16/2014   Obesity 02/11/2012    Past Surgical History:  Procedure Laterality Date   ABDOMINAL HYSTERECTOMY     ADENOIDECTOMY      CESAREAN SECTION N/A 01/30/2019   Procedure: CESAREAN SECTION;  Surgeon: Caren Macadam, MD;  Location: [facility];  Service: Obstetrics;  Laterality: N/A;   CESAREAN SECTION     2021   DILATION AND CURETTAGE OF UTERUS     DILATION AND EVACUATION N/A 04/01/2014   Procedure: DILATATION AND EVACUATION;  Surgeon: Elveria Royals, MD;  Location: [facility];  Service: Gynecology;  Laterality: N/A;   DILATION AND EVACUATION N/A 12/23/2017   Procedure: DILATATION AND EVACUATION;  Surgeon: Lavonia Drafts, MD;  Location: [facility];  Service: Gynecology;  Laterality: N/A;   GASTRIC BYPASS     TONSILLECTOMY AND ADENOIDECTOMY  1999   TOOTH EXTRACTION N/A 01/30/2021   Procedure: DENTAL RESTORATION/EXTRACTIONS;  Surgeon: Diona Browner, DMD;  Location: [facility];  Service: Oral Surgery;  Laterality: N/A;    OB History     Gravida  5   Para  2   Term  1   Preterm  1   AB  3   Living  2      SAB  3   IAB  0   Ectopic  0   Multiple  0   Live Births  2            Home Medications    Prior to Admission medications   Medication Sig Start Date End Date Taking? Authorizing Provider  doxycycline (VIBRAMYCIN) 100 MG capsule Take 1 capsule (100  mg total) by mouth 2 (two) times daily for 7 days. 12/11/21 12/18/21 Yes Hazel Sams, PA-C  predniSONE (DELTASONE) 20 MG tablet Take 1 tablet (20 mg total) by mouth daily for 7 days. 12/11/21 12/18/21 Yes Hazel Sams, PA-C  acetaminophen (TYLENOL) 325 MG tablet Take 325 mg by mouth every 6 (six) hours as needed for moderate pain.    [provider]  Blood Pressure Monitoring (BLOOD PRESSURE KIT) DEVI 1 Device by Does not apply route See admin instructions. Blood pressure cuff 05/24/19   Aletha Halim, MD  cholecalciferol (VITAMIN D3) 25 MCG (1000 UNIT) tablet Take 1,000 Units by mouth daily.    [provider]  Clobetasol Prop Emollient Base 0.05 % emollient cream Apply 1 application topically 2 (two) times daily. Avoid  contact with face. 08/27/21   Lynden Oxford Scales, PA-C  Cyanocobalamin (B-12) 1000 MCG SUBL Place 1,000 mcg under the tongue daily. Liquid    [provider]  ferrous sulfate 325 (65 FE) MG tablet Take 325 mg by mouth daily.    [provider]  folic acid (FOLVITE) 1 MG tablet Take 1 tablet (1 mg total) by mouth daily. 07/05/19   Aletha Halim, MD  HYDROcodone-acetaminophen (NORCO) 5-325 MG tablet Take 1 tablet by mouth every 6 (six) hours as needed for moderate pain. 01/30/21   Diona Browner, DMD  hydrOXYzine (ATARAX) 25 MG tablet Take 1 tablet (25 mg total) by mouth every 8 (eight) hours as needed. 11/13/21   Hazel Sams, PA-C  Multiple Vitamins-Minerals (MULTIVITAMIN WITH MINERALS) tablet Take 1 tablet by mouth daily.    [provider]    Family History Family History  Problem Relation Age of Onset   Diabetes Mother    Hyperlipidemia Mother    Hypertension Mother    Diabetes Father    Kidney disease Father    Hypertension Father    Hyperlipidemia Father    Cancer Father        pancreatic   Gestational diabetes Sister    Depression Sister    Diabetes Sister    Heart disease Maternal Grandmother    Heart disease Paternal Grandmother     Social History Social History   Tobacco Use   Smoking status: Never   Smokeless tobacco: Never  Vaping Use   Vaping Use: Never used  Substance Use Topics   Alcohol use: Yes    Comment: occasional beer   Drug use: No     Allergies   Aspirin, Other, and Penicillins   Review of Systems Review of Systems  Skin:  Positive for color change.  All other systems reviewed and are negative.   Physical Exam Triage Vital Signs ED Triage Vitals [12/11/21 0904]  Enc Vitals Group     BP 131/89     Pulse Rate 78     Resp 18     Temp 98.3 F (36.8 C)     Temp src      SpO2 100 %     Weight      Height      Head Circumference      Peak Flow      Pain Score      Pain Loc      Pain Edu?      Excl. in  [place]?    No data found.  Updated Vital Signs BP 131/89 (BP Location: Left Arm)    Pulse 78    Temp 98.3 F (36.8 C)  Resp 18    LMP 03/14/2019 (Within Days)    SpO2 100%   Visual Acuity Right Eye Distance:   Left Eye Distance:   Bilateral Distance:    Right Eye Near:   Left Eye Near:    Bilateral Near:     Physical Exam Vitals reviewed.  Constitutional:      General: She is not in acute distress.    Appearance: Normal appearance. She is not ill-appearing or diaphoretic.  HENT:     Head: Normocephalic and atraumatic.  Cardiovascular:     Rate and Rhythm: Normal rate and regular rhythm.     Heart sounds: Normal heart sounds.  Pulmonary:     Effort: Pulmonary effort is normal.     Breath sounds: Normal breath sounds.  Skin:    General: Skin is warm.     Findings: Wound present.     Comments: Bilateral hands with extensive dry scaling skin and tenderness. R index finger with small 5m laceration on the plantar aspect of the distal phalanx, with some surrounding tenderness and warmth. Also with 1cm wound interdigital web with some tenderness. ROM fingers intact but with discomfort. No snuffbox tenderness bilaterally. Cap refill <2 seconds, radial pulse 2+, sensation intact.   Neurological:     General: No focal deficit present.     Mental Status: She is alert and oriented to person, place, and time.  Psychiatric:        Mood and Affect: Mood normal.        Behavior: Behavior normal.        Thought Content: Thought content normal.        Judgment: Judgment normal.          UC Treatments / Results  Labs (all labs ordered are listed, but only abnormal results are displayed) Labs Reviewed - No data to display  EKG   Radiology No results found.  Procedures Procedures (including critical care time)  Medications Ordered in UC Medications  methylPREDNISolone sodium succinate (SOLU-MEDROL) 125 mg/2 mL injection 60 mg (60 mg Intramuscular Given 12/11/21 0919)     Initial Impression / Assessment and Plan / UC Course  I have reviewed the triage vital signs and the nursing notes.  Pertinent labs & imaging results that were available during my care of the patient were reviewed by me and considered in my medical decision making (see chart for details).     This patient is a very pleasant 41y.o. year old female presenting with chronic dermatitis of bilateral hands. Afebrile, nontachy. History hysterectomy. There are a few small wounds on the R hand with some surrounding tenderness, erythema, and warmth. Given extent of dermatitis, and concern for overlying celluitis, I am amenable to abx; penicillin allergy so doxycycline sent. She has already failed treatment with triamcinolone so IM solumedrol administered and low-dose prednisone x7 days sent. She is unfortunately unable to see derm until 6/23. ED return precautions discussed. Patient verbalizes understanding and agreement.    Final Clinical Impressions(s) / UC Diagnoses   Final diagnoses:  Eczema of both hands  Cellulitis of finger of right hand  Penicillin allergy  History of hysterectomy     Discharge Instructions      -Doxycycline twice daily for 7 days.  Make sure to wear sunscreen while spending time outside while on this medication as it can increase your chance of sunburn. You can take this medication with food if you have a sensitive stomach. -Prednisone one pill with breakfast x7 days -  Thick moisturizer for your hands  -Follow-up if symptoms worsen/persist - swelling, pain, discharge, new fevers.  -Follow-up with dermatology as scheduled in 4 months.      ED Prescriptions     Medication Sig Dispense Auth. Provider   doxycycline (VIBRAMYCIN) 100 MG capsule Take 1 capsule (100 mg total) by mouth 2 (two) times daily for 7 days. 14 capsule Hazel Sams, PA-C   predniSONE (DELTASONE) 20 MG tablet Take 1 tablet (20 mg total) by mouth daily for 7 days. 7 tablet Hazel Sams,  PA-C      PDMP not reviewed this encounter.   Hazel Sams, PA-C 12/11/21 [phone]

## 2021-12-11 NOTE — Discharge Instructions (Addendum)
-  Doxycycline twice daily for 7 days.  Make sure to wear sunscreen while spending time outside while on this medication as it can increase your chance of sunburn. You can take this medication with food if you have a sensitive stomach. -Prednisone one pill with breakfast x7 days -Thick moisturizer for your hands  -Follow-up if symptoms worsen/persist - swelling, pain, discharge, new fevers.  -Follow-up with dermatology as scheduled in 4 months.

## 2021-12-11 NOTE — ED Triage Notes (Signed)
Pt reports skin infection on neck and hands x 1 week.  Pt reports left hand swollen this morning.

## 2022-02-22 ENCOUNTER — Encounter (HOSPITAL_COMMUNITY): Payer: Self-pay | Admitting: Emergency Medicine

## 2022-02-22 ENCOUNTER — Emergency Department (HOSPITAL_COMMUNITY): Payer: Medicaid Other

## 2022-02-22 ENCOUNTER — Emergency Department (HOSPITAL_COMMUNITY)
Admission: EM | Admit: 2022-02-22 | Discharge: 2022-02-22 | Disposition: A | Discharge status: Home / Self Care | Payer: Medicaid Other | Attending: Emergency Medicine | Admitting: Emergency Medicine

## 2022-02-22 ENCOUNTER — Other Ambulatory Visit: Payer: Self-pay

## 2022-02-22 DIAGNOSIS — I10 Essential (primary) hypertension: Secondary | ICD-10-CM | POA: Insufficient documentation

## 2022-02-22 DIAGNOSIS — Y92007 Garden or yard of unspecified non-institutional (private) residence as the place of occurrence of the external cause: Secondary | ICD-10-CM | POA: Diagnosis not present

## 2022-02-22 DIAGNOSIS — S0990XA Unspecified injury of head, initial encounter: Secondary | ICD-10-CM | POA: Diagnosis present

## 2022-02-22 DIAGNOSIS — S0240FA Zygomatic fracture, left side, initial encounter for closed fracture: Secondary | ICD-10-CM | POA: Insufficient documentation

## 2022-02-22 DIAGNOSIS — S01412A Laceration without foreign body of left cheek and temporomandibular area, initial encounter: Secondary | ICD-10-CM | POA: Insufficient documentation

## 2022-02-22 DIAGNOSIS — S0240DA Maxillary fracture, left side, initial encounter for closed fracture: Secondary | ICD-10-CM

## 2022-02-22 MED ORDER — OXYCODONE HCL 5 MG PO TABS
5.0000 mg | ORAL_TABLET | Freq: Once | ORAL | Status: AC
  Administered 2022-02-22: 5 mg via ORAL
  Filled 2022-02-22: qty 1

## 2022-02-22 MED ORDER — LIDOCAINE HCL (PF) 1 % IJ SOLN
5.0000 mL | Freq: Once | INTRAMUSCULAR | Status: AC
  Administered 2022-02-22: 5 mL
  Filled 2022-02-22: qty 5

## 2022-02-22 MED ORDER — ACETAMINOPHEN 325 MG PO TABS
650.0000 mg | ORAL_TABLET | Freq: Once | ORAL | Status: AC
  Administered 2022-02-22: 650 mg via ORAL
  Filled 2022-02-22: qty 2

## 2022-02-22 MED ORDER — LIDOCAINE-EPINEPHRINE-TETRACAINE (LET) TOPICAL GEL
3.0000 mL | Freq: Once | TOPICAL | Status: AC
  Administered 2022-02-22: 3 mL via TOPICAL
  Filled 2022-02-22: qty 3

## 2022-02-22 MED ORDER — DOXYCYCLINE HYCLATE 100 MG PO CAPS: 100.0000 mg | ORAL_CAPSULE | Freq: Two times a day (BID) | ORAL | 0 refills | Status: DC

## 2022-02-22 MED ORDER — HYDROCODONE-ACETAMINOPHEN 5-325 MG PO TABS
1.0000 | ORAL_TABLET | Freq: Four times a day (QID) | ORAL | 0 refills | Status: DC | PRN
Start: 2022-02-22 — End: 2023-04-22

## 2022-02-22 MED ORDER — TETRACAINE HCL 0.5 % OP SOLN
2.0000 [drp] | Freq: Once | OPHTHALMIC | Status: DC
  Filled 2022-02-22: qty 4

## 2022-02-22 MED ORDER — FLUORESCEIN SODIUM 1 MG OP STRP
1.0000 | ORAL_STRIP | Freq: Once | OPHTHALMIC | Status: AC
  Administered 2022-02-22: 1 via OPHTHALMIC
  Filled 2022-02-22: qty 1

## 2022-02-22 NOTE — ED Notes (Signed)
Patient is resting comfortably. 
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
## 2022-02-22 NOTE — ED Notes (Signed)
Patient transported to CT 
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
## 2022-08-24 ENCOUNTER — Encounter (HOSPITAL_COMMUNITY): Payer: Self-pay | Admitting: *Deleted

## 2022-08-24 ENCOUNTER — Ambulatory Visit (HOSPITAL_COMMUNITY)
Admission: EM | Admit: 2022-08-24 | Discharge: 2022-08-24 | Disposition: A | Discharge status: Home / Self Care | Payer: Medicaid Other | Attending: Family Medicine | Admitting: Family Medicine

## 2022-08-24 DIAGNOSIS — L301 Dyshidrosis [pompholyx]: Secondary | ICD-10-CM

## 2022-08-24 MED ORDER — CLOBETASOL PROP EMOLLIENT BASE 0.05 % EX CREA: 1.0000 | TOPICAL_CREAM | Freq: Two times a day (BID) | CUTANEOUS | 2 refills | Status: DC

## 2022-08-24 MED ORDER — DEXAMETHASONE SODIUM PHOSPHATE 10 MG/ML IJ SOLN
INTRAMUSCULAR | Status: AC
Start: 2022-08-24 — End: ?
  Filled 2022-08-24: qty 1

## 2022-08-24 MED ORDER — DEXAMETHASONE SODIUM PHOSPHATE 10 MG/ML IJ SOLN
10.0000 mg | Freq: Once | INTRAMUSCULAR | Status: AC
Start: 2022-08-24 — End: 2022-08-24
  Administered 2022-08-24: 10 mg via INTRAMUSCULAR

## 2022-08-24 NOTE — ED Triage Notes (Signed)
Pt states her eczema is flaring up she has been a dermatologist in the past but is currently out of her cream.

## 2022-08-24 NOTE — Discharge Instructions (Signed)
Meds ordered this encounter  Medications   dexamethasone (DECADRON) injection 10 mg   Clobetasol Prop Emollient Base 0.05 % emollient cream    Sig: Apply 1 Application topically 2 (two) times daily. Avoid contact with face.    Dispense:  60 g    Refill:  2

## 2022-08-25 NOTE — ED Provider Notes (Addendum)
[name]   [id number] 08/24/22 Arrival Time: 1608  ASSESSMENT & PLAN:  1. Dyshidrotic dermatitis   Exacerbation. No signs of bacterial skin infection. Meds ordered this encounter  Medications   dexamethasone (DECADRON) injection 10 mg   Clobetasol Prop Emollient Base 0.05 % emollient cream    Sig: Apply 1 Application topically 2 (two) times daily. Avoid contact with face.    Dispense:  60 g    Refill:  2    Will follow up with PCP or here if worsening or failing to improve as anticipated. Reviewed expectations re: course of current medical issues. Questions answered. Outlined signs and symptoms indicating need for more acute intervention. Patient verbalized understanding. After Visit Summary given.   SUBJECTIVE:  Vanessa Braun is a 41 y.o. female who presents with a skin complaint. Eczema on hands. Exacerbation over past few weeks. Dry skin. Intense itching. Afebrile. Out of medications.     OBJECTIVE: Vitals:   08/24/22 1701  BP: 136/89  Pulse: 87  Resp: 18  Temp: 98.1 F (36.7 C)  TempSrc: Oral  SpO2: 99%    General appearance: alert; no distress HEENT: [place]; AT Neck: supple with FROM Extremities: no edema; moves all extremities normally Skin: warm and dry; bilateral hands with dry and cracked skin throughout; some blistering Psychological: alert and cooperative; normal mood and affect  Allergies  Allergen Reactions   Aspirin     Contraindicated due to gastric bypass   Other     "anything antibacterial"; some creams for dermatitis   Penicillins Itching and Rash    Past Medical History:  Diagnosis Date   Abnormal Pap smear    Anemia    during pregnancy   Arthritis    knee   Chronic dermatitis of hands    Complication of anesthesia    trouble waking up   Eczema    GERD (gastroesophageal reflux disease)    lost weight with bypass surgery, no problems since   Hypertension    just after pregnancy (treated for a short time)    PCOS (polycystic ovarian syndrome)    Social History   Socioeconomic History   Marital status: Married    Spouse name: Not on file   Number of children: Not on file   Years of education: Not on file   Highest education level: Not on file  Occupational History   Not on file  Tobacco Use   Smoking status: Never   Smokeless tobacco: Never  Vaping Use   Vaping Use: Never used  Substance and Sexual Activity   Alcohol use: Yes    Comment: occasional beer   Drug use: No   Sexual activity: Yes    Birth control/protection: None    Comment: last IC-two days ago  Other Topics Concern   Not on file  Social History Narrative   Not on file   Social Determinants of Health   Financial Resource Strain: Low Risk  (01/26/2019)   Overall Financial Resource Strain (CARDIA)    Difficulty of Paying Living Expenses: Not hard at all  Food Insecurity: No Food Insecurity (11/03/2018)   Hunger Vital Sign    Worried About Estate manager/land agent of Food in the Last Year: Never true    Ran Out of Food in the Last Year: Never true  Transportation Needs: No Transportation Needs (11/03/2018)   PRAPARE - Hydrologist (Medical): No    Lack of Transportation (Non-Medical): No  Physical Activity: Not on  file  Stress: No Stress Concern Present (01/26/2019)   [employer] of Occupational Health - Occupational Stress Questionnaire    Feeling of Stress : Only a little  Social Connections: Not on file  Intimate Partner Violence: Not At Risk (01/26/2019)   Humiliation, Afraid, Rape, and Kick questionnaire    Fear of Current or Ex-Partner: No    Emotionally Abused: No    Physically Abused: No    Sexually Abused: No   Family History  Problem Relation Age of Onset   Diabetes Mother    Hyperlipidemia Mother    Hypertension Mother    Diabetes Father    Kidney disease Father    Hypertension Father    Hyperlipidemia Father    Cancer Father        pancreatic   Gestational diabetes Sister     Depression Sister    Diabetes Sister    Heart disease Maternal Grandmother    Heart disease Paternal Grandmother    Past Surgical History:  Procedure Laterality Date   ABDOMINAL HYSTERECTOMY     ADENOIDECTOMY     CESAREAN SECTION N/A 01/30/2019   Procedure: CESAREAN SECTION;  Surgeon: Federico Flake, MD;  Location: [facility];  Service: Obstetrics;  Laterality: N/A;   CESAREAN SECTION     2021   DILATION AND CURETTAGE OF UTERUS     DILATION AND EVACUATION N/A 04/01/2014   Procedure: DILATATION AND EVACUATION;  Surgeon: Robley Fries, MD;  Location: [facility];  Service: Gynecology;  Laterality: N/A;   DILATION AND EVACUATION N/A 12/23/2017   Procedure: DILATATION AND EVACUATION;  Surgeon: Willodean Rosenthal, MD;  Location: [facility];  Service: Gynecology;  Laterality: N/A;   GASTRIC BYPASS     TONSILLECTOMY AND ADENOIDECTOMY  1999   TOOTH EXTRACTION N/A 01/30/2021   Procedure: DENTAL RESTORATION/EXTRACTIONS;  Surgeon: Ocie Doyne, DMD;  Location: [facility];  Service: Oral Surgery;  Laterality: N/A;      Mardella Layman, MD 08/25/22 7342    Mardella Layman, MD 08/25/22 [phone]

## 2022-10-20 ENCOUNTER — Ambulatory Visit (HOSPITAL_COMMUNITY)
Admission: RE | Admit: 2022-10-20 | Discharge: 2022-10-20 | Disposition: A | Discharge status: Home / Self Care | Payer: Medicaid Other | Source: Ambulatory Visit | Attending: Cardiovascular Disease | Admitting: Cardiovascular Disease

## 2022-10-20 ENCOUNTER — Other Ambulatory Visit (HOSPITAL_COMMUNITY): Payer: Self-pay | Admitting: Orthopaedic Surgery

## 2022-10-20 DIAGNOSIS — M79661 Pain in right lower leg: Secondary | ICD-10-CM | POA: Diagnosis present

## 2023-02-20 ENCOUNTER — Ambulatory Visit (HOSPITAL_COMMUNITY)
Admission: EM | Admit: 2023-02-20 | Discharge: 2023-02-20 | Disposition: A | Discharge status: Home / Self Care | Payer: Medicaid Other | Attending: Emergency Medicine | Admitting: Emergency Medicine

## 2023-02-20 ENCOUNTER — Encounter (HOSPITAL_COMMUNITY): Payer: Self-pay

## 2023-02-20 DIAGNOSIS — L309 Dermatitis, unspecified: Secondary | ICD-10-CM

## 2023-02-20 MED ORDER — CLOBETASOL PROPIONATE 0.05 % EX CREA: 1.0000 | TOPICAL_CREAM | Freq: Two times a day (BID) | CUTANEOUS | 2 refills | Status: DC

## 2023-02-20 MED ORDER — DEXAMETHASONE SODIUM PHOSPHATE 10 MG/ML IJ SOLN
INTRAMUSCULAR | Status: AC
Start: 2023-02-20 — End: ?
  Filled 2023-02-20: qty 1

## 2023-02-20 MED ORDER — PREDNISONE 20 MG PO TABS: 40.0000 mg | ORAL_TABLET | Freq: Every day | ORAL | 0 refills | Status: DC

## 2023-02-20 MED ORDER — DEXAMETHASONE SODIUM PHOSPHATE 10 MG/ML IJ SOLN
10.0000 mg | Freq: Once | INTRAMUSCULAR | Status: AC
Start: 2023-02-20 — End: 2023-02-20
  Administered 2023-02-20: 10 mg via INTRAMUSCULAR

## 2023-02-20 NOTE — Discharge Instructions (Signed)
Today you are being treated for eczema flare  Due to the extent of your hand you have been given a Decadron injection here in the office ideally will start to see some relief in about 30 minutes  Starting tomorrow take prednisone every morning with food for 5 days to further reduce the inflammatory process that causes rash  May use topical clobetasol ointment twice daily until rash is cleared, 2 refills given on medication  Continue to moisturize skin daily or twice a day as needed as dryness will make symptoms worse  Avoid over exposure to heat as this may exacerbate your symptoms   may follow-up with his urgent care or your primary doctor as needed

## 2023-02-20 NOTE — ED Provider Notes (Signed)
[facility]    CSN: [id number] Arrival date & time: 02/20/23  1204      History   Chief Complaint Chief Complaint  Patient presents with   Eczema    HPI Vanessa Braun is a 42 y.o. female.   Patient presents for evaluation of flare of eczema rash to the bilateral hands for 7 days, the left arm and bilateral legs beginning 1 day ago.  Believes symptoms are related to sun exposure and weather change.  Associated pruritus has attempted use of an emollient which has been ineffective.  Symptoms have occurred before.  Denies fever, drainage.  Denies changes in toiletries, recent travel or diet.   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   AMA (advanced maternal age) multigravida 35+, first trimester 06/07/2019   Supervision of high risk pregnancy, antepartum 05/24/2019   Vitamin D deficiency 07/25/2018   History of cesarean delivery 07/21/2018   Chronic hypertension during pregnancy, antepartum 07/21/2018   Previous gastric bypass affecting pregnancy, antepartum 07/21/2018   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   Eczema of both hands 08/16/2014   Obesity 02/11/2012    Past Surgical History:  Procedure Laterality Date   ABDOMINAL HYSTERECTOMY     ADENOIDECTOMY     CESAREAN SECTION N/A 01/30/2019   Procedure: CESAREAN SECTION;  Surgeon: Federico Flake, MD;  Location: [facility];  Service: Obstetrics;  Laterality: N/A;    CESAREAN SECTION     2021   DILATION AND CURETTAGE OF UTERUS     DILATION AND EVACUATION N/A 04/01/2014   Procedure: DILATATION AND EVACUATION;  Surgeon: Robley Fries, MD;  Location: [facility];  Service: Gynecology;  Laterality: N/A;   DILATION AND EVACUATION N/A 12/23/2017   Procedure: DILATATION AND EVACUATION;  Surgeon: Willodean Rosenthal, MD;  Location: [facility];  Service: Gynecology;  Laterality: N/A;   GASTRIC BYPASS     TONSILLECTOMY AND ADENOIDECTOMY  1999   TOOTH EXTRACTION N/A 01/30/2021   Procedure: DENTAL RESTORATION/EXTRACTIONS;  Surgeon: Ocie Doyne, DMD;  Location: [facility];  Service: Oral Surgery;  Laterality: N/A;    OB History     Gravida  5   Para  2   Term  1   Preterm  1   AB  3   Living  2      SAB  3   IAB  0   Ectopic  0   Multiple  0   Live Births  2            Home Medications    Prior to Admission medications   Medication Sig Start Date End Date Taking? Authorizing Provider  acetaminophen (TYLENOL) 325 MG tablet Take 325 mg by mouth every 6 (six) hours as needed for moderate pain.    [provider]  Blood Pressure Monitoring (BLOOD PRESSURE KIT) DEVI 1  Device by Does not apply route See admin instructions. Blood pressure cuff 05/24/19   Brackenridge Bing, MD  cholecalciferol (VITAMIN D3) 25 MCG (1000 UNIT) tablet Take 1,000 Units by mouth daily.    [provider]  Clobetasol Prop Emollient Base 0.05 % emollient cream Apply 1 Application topically 2 (two) times daily. Avoid contact with face. 08/24/22   Mardella Layman, MD  Cyanocobalamin (B-12) 1000 MCG SUBL Place 1,000 mcg under the tongue daily. Liquid    [provider]  doxycycline (VIBRAMYCIN) 100 MG capsule Take 1 capsule (100 mg total) by mouth 2 (two) times daily. 02/22/22   Petrucelli, Samantha R, PA-C  ferrous sulfate 325 (65 FE) MG tablet Take 325 mg by mouth daily.    [provider]  HYDROcodone-acetaminophen (NORCO/VICODIN) 5-325 MG tablet  Take 1-2 tablets by mouth every 6 (six) hours as needed for severe pain. 02/22/22   Petrucelli, Samantha R, PA-C  hydrOXYzine (ATARAX) 25 MG tablet Take 1 tablet (25 mg total) by mouth every 8 (eight) hours as needed. 11/13/21   Rhys Martini, PA-C  Multiple Vitamins-Minerals (MULTIVITAMIN WITH MINERALS) tablet Take 1 tablet by mouth daily.    [provider]    Family History Family History  Problem Relation Age of Onset   Diabetes Mother    Hyperlipidemia Mother    Hypertension Mother    Diabetes Father    Kidney disease Father    Hypertension Father    Hyperlipidemia Father    Cancer Father        pancreatic   Gestational diabetes Sister    Depression Sister    Diabetes Sister    Heart disease Maternal Grandmother    Heart disease Paternal Grandmother     Social History Social History   Tobacco Use   Smoking status: Never   Smokeless tobacco: Never  Vaping Use   Vaping Use: Never used  Substance Use Topics   Alcohol use: Yes    Comment: occasional beer   Drug use: No     Allergies   Aspirin, Other, and Penicillins   Review of Systems Review of Systems  Constitutional: Negative.   Respiratory: Negative.    Cardiovascular: Negative.   Skin:  Positive for rash. Negative for color change, pallor and wound.     Physical Exam Triage Vital Signs ED Triage Vitals [02/20/23 1234]  Enc Vitals Group     BP 128/83     Pulse Rate 83     Resp 18     Temp 98.3 F (36.8 C)     Temp Source Oral     SpO2 100 %     Weight      Height      Head Circumference      Peak Flow      Pain Score      Pain Loc      Pain Edu?      Excl. in GC?    No data found.  Updated Vital Signs BP 128/83 (BP Location: Left Arm)   Pulse 83   Temp 98.3 F (36.8 C) (Oral)   Resp 18   LMP 03/14/2019 (Within Days)   SpO2 100%   Visual Acuity Right Eye Distance:   Left Eye Distance:   Bilateral Distance:    Right Eye Near:   Left Eye Near:    Bilateral Near:      Physical Exam Constitutional:      Appearance: Normal appearance.  Eyes:  Extraocular Movements: Extraocular movements intact.  Pulmonary:     Effort: Pulmonary effort is normal.  Skin:    Comments: Dry flesh tone rash present to the bilateral legs, the left arm and to the dorsum of the bilateral hands, significant skin peeling present to the palmar aspects of the bilateral hands, no drainage or tenderness  Neurological:     Mental Status: She is alert and oriented to person, place, and time. Mental status is at baseline.      UC Treatments / Results  Labs (all labs ordered are listed, but only abnormal results are displayed) Labs Reviewed - No data to display  EKG   Radiology No results found.  Procedures Procedures (including critical care time)  Medications Ordered in UC Medications - No data to display  Initial Impression / Assessment and Plan / UC Course  I have reviewed the triage vital signs and the nursing notes.  Pertinent labs & imaging results that were available during my care of the patient were reviewed by me and considered in my medical decision making (see chart for details).  Eczema  Presentation and symptomology is consistent with above diagnosis, has known history, Decadron injection given in the office as well as oral prednisone due to the severity of the bilateral hands, clobetasol cream prescribed for long-term management as she has had success with this medication in the past, recommended continued use of daily emollient, with avoidance of long exposure to heat and water, may follow-up with urgent care as needed Final Clinical Impressions(s) / UC Diagnoses   Final diagnoses:  None   Discharge Instructions   None    ED Prescriptions   None    PDMP not reviewed this encounter.   Valinda Hoar, NP 02/20/23 1311

## 2023-02-20 NOTE — ED Triage Notes (Signed)
Here for eczema flare-up on arms,face and legs.

## 2023-04-21 ENCOUNTER — Encounter: Payer: Self-pay | Admitting: Obstetrics and Gynecology

## 2023-04-21 ENCOUNTER — Ambulatory Visit (INDEPENDENT_AMBULATORY_CARE_PROVIDER_SITE_OTHER): Payer: Medicaid Other | Admitting: Obstetrics and Gynecology

## 2023-04-21 ENCOUNTER — Other Ambulatory Visit: Payer: Self-pay

## 2023-04-21 VITALS — BP 117/81 | HR 75 | Ht 69.0 in | Wt 263.3 lb

## 2023-04-21 DIAGNOSIS — R102 Pelvic and perineal pain: Secondary | ICD-10-CM

## 2023-04-21 DIAGNOSIS — Z1239 Encounter for other screening for malignant neoplasm of breast: Secondary | ICD-10-CM

## 2023-04-21 DIAGNOSIS — Z124 Encounter for screening for malignant neoplasm of cervix: Secondary | ICD-10-CM

## 2023-04-21 DIAGNOSIS — Z0000 Encounter for general adult medical examination without abnormal findings: Secondary | ICD-10-CM

## 2023-04-21 DIAGNOSIS — Z8742 Personal history of other diseases of the female genital tract: Secondary | ICD-10-CM | POA: Diagnosis not present

## 2023-04-21 NOTE — Progress Notes (Signed)
Pt reports lower right abdomen pain

## 2023-04-21 NOTE — Progress Notes (Signed)
Obstetrics and Gynecology New Patient Evaluation  Appointment Date: 04/21/2023  OBGYN Clinic: [facility]  Primary Care Provider: Leilani Able  Referring Provider: Leilani Able, MD  Chief Complaint:  Chief Complaint  Patient presents with   Gynecologic Exam  Chronic right sided pelvic/abdominal pain   History of Present Illness: Vanessa Braun is a 42 y.o.  [phone] (Patient's last menstrual period was 03/14/2019 (within days).), seen for the above chief complaint. Her past medical history is significant for 2021 repeat c-section and total hyst with bilateral salpingectomy for accreta, h/o roux en y gastric bypass  Since her c-hyst in 2021 she states that she has intermittent RLQ pain. Last time was a few days and ago and it can last for a few minutes to all day and can vary in intensity, no aggravating/inciting factors or alleviating factors, she has episodes of chronic diarrhea, but no blood in her bowel movements or lower urinary tract s/s. She denies any VB or spotting   Review of Systems: Pertinent items noted in HPI and remainder of comprehensive ROS otherwise negative.   Past Medical History:  Past Medical History:  Diagnosis Date   Abnormal Pap smear    Anemia    during pregnancy   Arthritis    knee   Chronic dermatitis of hands    Complication of anesthesia    trouble waking up   Eczema    GERD (gastroesophageal reflux disease)    lost weight with bypass surgery, no problems since   Hypertension    just after pregnancy (treated for a short time)   PCOS (polycystic ovarian syndrome)    Vitiligo     Past Surgical History:  Past Surgical History:  Procedure Laterality Date   ABDOMINAL HYSTERECTOMY  2021   C-Hyst   ADENOIDECTOMY     CESAREAN SECTION N/A 01/30/2019   Procedure: CESAREAN SECTION;  Surgeon: Federico Flake, MD;  Location: [facility];  Service: Obstetrics;  Laterality: N/A;   CESAREAN SECTION      2021   DILATION AND CURETTAGE OF UTERUS     DILATION AND EVACUATION N/A 04/01/2014   Procedure: DILATATION AND EVACUATION;  Surgeon: Robley Fries, MD;  Location: [facility];  Service: Gynecology;  Laterality: N/A;   DILATION AND EVACUATION N/A 12/23/2017   Procedure: DILATATION AND EVACUATION;  Surgeon: Willodean Rosenthal, MD;  Location: [facility];  Service: Gynecology;  Laterality: N/A;   GASTRIC BYPASS     TONSILLECTOMY AND ADENOIDECTOMY  1999   TOOTH EXTRACTION N/A 01/30/2021   Procedure: DENTAL RESTORATION/EXTRACTIONS;  Surgeon: Ocie Doyne, DMD;  Location: [facility];  Service: Oral Surgery;  Laterality: N/A;    Past Obstetrical History:  OB History  Gravida Para Term Preterm AB Living  5 2 1 1 3 2   SAB IAB Ectopic Multiple Live Births  3 0 0 0 2    # Outcome Date GA Lbr Len/2nd Weight Sex Delivery Anes PTL Lv  5 Preterm 10/29/19 [redacted]w[redacted]d   F   Y LIV  4 Term 01/30/19 [redacted]w[redacted]d  5 lb 6 oz (2.438 kg) F CS-LTranv EPI N LIV     Complications: Fetal heart rate decelerations, delivered  3 SAB 2019 [redacted]w[redacted]d         2 SAB 2013 [redacted]w[redacted]d         1 SAB 2012 [redacted]w[redacted]d           Past Gynecological History: As per HPI. History of Pap Smear(s): Yes.  Last pap 2019, which was negative cytology and HPV  Social History:  Social History   Socioeconomic History   Marital status: Divorced    Spouse name: Not on file   Number of children: Not on file   Years of education: Not on file   Highest education level: Not on file  Occupational History   Not on file  Tobacco Use   Smoking status: Never   Smokeless tobacco: Never  Vaping Use   Vaping Use: Never used  Substance and Sexual Activity   Alcohol use: Yes    Comment: occasional beer   Drug use: No   Sexual activity: Yes    Birth control/protection: None    Comment: last IC-two days ago  Other Topics Concern   Not on file  Social History Narrative   Not on file   Social Determinants of Health   Financial Resource Strain: Low Risk  (01/26/2019)    Overall Financial Resource Strain (CARDIA)    Difficulty of Paying Living Expenses: Not hard at all  Food Insecurity: No Food Insecurity (11/03/2018)   Hunger Vital Sign    Worried About Running Out of Food in the Last Year: Never true    Ran Out of Food in the Last Year: Never true  Transportation Needs: No Transportation Needs (11/03/2018)   PRAPARE - Administrator, Civil Service (Medical): No    Lack of Transportation (Non-Medical): No  Physical Activity: Not on file  Stress: No Stress Concern Present (01/26/2019)   [employer] of Occupational Health - Occupational Stress Questionnaire    Feeling of Stress : Only a little  Social Connections: Not on file  Intimate Partner Violence: Not At Risk (01/26/2019)   Humiliation, Afraid, Rape, and Kick questionnaire    Fear of Current or Ex-Partner: No    Emotionally Abused: No    Physically Abused: No    Sexually Abused: No    Family History:  Family History  Problem Relation Age of Onset   Diabetes Mother    Hyperlipidemia Mother    Hypertension Mother    Diabetes Father    Kidney disease Father    Hypertension Father    Hyperlipidemia Father    Cancer Father        pancreatic   Gestational diabetes Sister    Depression Sister    Diabetes Sister    Heart disease Maternal Grandmother    Heart disease Paternal Grandmother     Medications None  Allergies Aspirin, Other, and Penicillins   Physical Exam:  BP 117/81   Pulse 75   Ht 5\' 9"  (1.753 m)   Wt 263 lb 4.8 oz (119.4 kg)   LMP 03/14/2019 (Within Days)   BMI 38.88 kg/m  Body mass index is 38.88 kg/m. General appearance: Well nourished, well developed female in no acute distress.  Neck:  Supple, normal appearance, and no thyromegaly  Cardiovascular: normal s1 and s2.  No murmurs, rubs or gallops. Respiratory:  Clear to auscultation bilateral. Normal respiratory effort Abdomen: positive bowel sounds and no masses, hernias; diffusely non tender to  palpation, non distended Breasts: no s/s Neuro/Psych:  Normal mood and affect.  Skin:  Warm and dry.  Lymphatic:  No inguinal lymphadenopathy.   Pelvic exam performed in the presence of a chaperone Pelvic exam: is not limited by body habitus EGBUS: within normal limits Vagina: within normal limits and with no blood or discharge in the vault Cervix: surgically absent Cuff: wnl Bimanual exam: negative  Laboratory: no new labs  Radiology: no new imaging Narrative & Impression  CLINICAL DATA:  Abdominal pain, nonlocalized. Evaluate for intussusception.   EXAM: CT ABDOMEN AND PELVIS WITH CONTRAST   TECHNIQUE: Multidetector CT imaging of the abdomen and pelvis was performed using the standard protocol following bolus administration of intravenous contrast.   CONTRAST:  OMNIPAQUE IOHEXOL 300 MG/ML  SOLN   COMPARISON:  Noncontrast CT earlier today. Enhanced abdominal CT March 2016   FINDINGS: Lower chest:  No contributory findings.   Hepatobiliary: No focal liver abnormality.No evidence of biliary obstruction or stone.   Pancreas: Unremarkable.   Spleen: Unremarkable.   Adrenals/Urinary Tract: Negative adrenals. No hydronephrosis or stone. Unremarkable bladder.   Stomach/Bowel: The intussusception appearance at the enteroenteric anastomosis is no longer seen. Gastric bypass without visible inflammation or obstruction.   Vascular/Lymphatic: No acute vascular abnormality. No mass or adenopathy.   Reproductive:Hysterectomy. 4 cm lesion in the right ovary with medial solid appearance and lateral cystic appearance. No adjacent fat stranding.   Other: No ascites or pneumoperitoneum.   Musculoskeletal: No acute abnormalities.   IMPRESSION: 1. Negative for intussusception or obstruction. 2. 4 cm right ovarian lesion which is both solid and cystic. This could reflect a hemorrhagic cyst with retracting clot, but recommend pelvic ultrasound in 4-6 weeks to exclude  cyst with soft tissue nodule.     Electronically Signed   By: Marnee Spring M.D.   On: 12/08/2020 05:03   Narrative & Impression  CLINICAL DATA:  No right upper quadrant and flank pain.   EXAM: CT ABDOMEN AND PELVIS WITHOUT CONTRAST   TECHNIQUE: Multidetector CT imaging of the abdomen and pelvis was performed following the standard protocol without IV contrast.   COMPARISON:  Right upper quadrant ultrasound earlier today. CT 01/12/2015   FINDINGS: Lower chest: The lung bases are clear. No focal airspace disease or pleural effusion.   Hepatobiliary: No focal hepatic abnormality on noncontrast exam. Mild gallbladder distention without pericholecystic fat stranding or inflammation. There is no biliary dilatation.   Pancreas: No ductal dilatation or inflammation.   Spleen: Normal in size without focal abnormality.   Adrenals/Urinary Tract: Normal adrenal glands. No hydronephrosis or renal calculi. No perinephric edema. Ureters are decompressed without stones along the course. Urinary bladder is completely empty.   Stomach/Bowel: Gastric bypass anatomy with enteric suture line crossing the diaphragmatic hiatus. Patulous distal esophagus with small hiatal hernia. The Roux limb is nondilated. The excluded gastric remnant is decompressed. Jejunal anastomosis in the right abdomen. There may be a short segment small bowel intussusception at the jejunal anastomosis, coronal series 6, image 48 not well evaluated in the absence of enteric contrast. No associated wall thickening, inflammation, pneumatosis or obstruction. Remaining small bowel is decompressed. Small volume of colonic stool in the colon. Normal appendix.   Vascular/Lymphatic: Normal caliber abdominal aorta. No portal venous or mesenteric gas. No adenopathy.   Reproductive: The uterus is not well identified, may be atrophic or surgically absent. Right ovary is minimally prominent and contains a possible  hemorrhagic cyst. No adjacent fat stranding. Left ovary is tentatively visualized and quiescent.   Other: No free air or free fluid. Postsurgical change of the anterior abdominal wall. No body wall hernia.   Musculoskeletal: There are no acute or suspicious osseous abnormalities.   IMPRESSION: 1. No renal stones or obstructive uropathy. 2. Gastric bypass anatomy with small hiatal hernia. Possible short segment small bowel intussusception at the jejunal anastomosis, not well evaluated in the absence of  enteric contrast. No associated wall thickening, inflammation, or obstruction. 3. Right ovary is minimally prominent and contains a possible hemorrhagic cyst. No adjacent fat stranding. Consensus guidelines for a cyst of this size recommend no further follow-up for incidentally detected lesions. Given right-sided pain, further evaluation with pelvic ultrasound could be considered.     Electronically Signed   By: Narda Rutherford M.D.   On: 12/08/2020 01:42    Assessment: patient stable  Plan:  1. Cervical cancer screening Op note and surg path show that the cervix was removed and wnl. No e/o cervix, and patient with no h/o abnormal paps and last one in 2019 was wnl. I told her that if she doesn't have any spotting or bleeding s/s that no need for future paps  2. Breast screening CMA to schedule screening mammogram - MM 3D SCREENING MAMMOGRAM BILATERAL BREAST; Future  3. History of ovarian cyst Recommend cyst f/u and if benign/no GYN cause, then will need to touch base with her Gastric Bypass surgeons to follow up her prior CT scan findings.  - US PELVIC COMPLETE WITH TRANSVAGINAL; Future  4. Pelvic pain - US PELVIC COMPLETE WITH TRANSVAGINAL; Future  RTC PRN   Cornelia Copa MD Attending Center for [facility])

## 2023-04-26 ENCOUNTER — Ambulatory Visit
Admission: RE | Admit: 2023-04-26 | Discharge: 2023-04-26 | Disposition: A | Discharge status: Home / Self Care | Payer: Medicaid Other | Source: Ambulatory Visit | Attending: Obstetrics and Gynecology | Admitting: Obstetrics and Gynecology

## 2023-04-26 DIAGNOSIS — Z1239 Encounter for other screening for malignant neoplasm of breast: Secondary | ICD-10-CM

## 2023-04-29 ENCOUNTER — Ambulatory Visit (HOSPITAL_COMMUNITY): Admission: RE | Admit: 2023-04-29 | Payer: Medicaid Other | Source: Ambulatory Visit

## 2023-05-05 ENCOUNTER — Ambulatory Visit (HOSPITAL_COMMUNITY): Admission: RE | Admit: 2023-05-05 | Payer: Medicaid Other | Source: Ambulatory Visit

## 2023-06-20 ENCOUNTER — Encounter (HOSPITAL_COMMUNITY): Payer: Self-pay

## 2023-06-20 ENCOUNTER — Ambulatory Visit (HOSPITAL_COMMUNITY)
Admission: EM | Admit: 2023-06-20 | Discharge: 2023-06-20 | Disposition: A | Discharge status: Home / Self Care | Payer: Medicaid Other | Attending: Internal Medicine | Admitting: Internal Medicine

## 2023-06-20 DIAGNOSIS — L259 Unspecified contact dermatitis, unspecified cause: Secondary | ICD-10-CM

## 2023-06-20 MED ORDER — HYDROXYZINE HCL 25 MG PO TABS: 25.0000 mg | ORAL_TABLET | Freq: Three times a day (TID) | ORAL | 0 refills | Status: DC | PRN

## 2023-06-20 MED ORDER — CLOTRIMAZOLE-BETAMETHASONE 1-0.05 % EX CREA: TOPICAL_CREAM | CUTANEOUS | 0 refills | Status: DC

## 2023-06-20 MED ORDER — TRIAMCINOLONE ACETONIDE 0.1 % EX CREA: 1.0000 | TOPICAL_CREAM | Freq: Two times a day (BID) | CUTANEOUS | 0 refills | Status: DC

## 2023-06-20 MED ORDER — DEXAMETHASONE SODIUM PHOSPHATE 10 MG/ML IJ SOLN
INTRAMUSCULAR | Status: AC
  Filled 2023-06-20: qty 1

## 2023-06-20 MED ORDER — DEXAMETHASONE SODIUM PHOSPHATE 10 MG/ML IJ SOLN
10.0000 mg | Freq: Once | INTRAMUSCULAR | Status: AC
  Administered 2023-06-20: 10 mg via INTRAMUSCULAR

## 2023-06-20 NOTE — ED Triage Notes (Signed)
Patient here today with c/o rash on both hand X 1 week. She has a h/o eczema on her hands. Hands are dry and cracked.

## 2023-06-20 NOTE — Discharge Instructions (Addendum)
Please use hand cream as recommended Take prescribed medications as directed If you have worsening symptoms please return to urgent care to be reevaluated

## 2023-06-20 NOTE — ED Provider Notes (Addendum)
[facility]    CSN: [id number] Arrival date & time: 06/20/23  1234      History   Chief Complaint Chief Complaint  Patient presents with   Rash    HPI Vanessa Braun is a 42 y.o. female to the urgent care with a history of eczema comes to urgent care with 1 week history of itchy rash involving both hands.  Patient's hands are itchy, dry and cracked.  No erythema or discharge.  Patient has these episodes in a recurrent fashion.  She sees a dermatologist who has been managing her flareups with Lotrisone or betamethasone cream.  No fever or chills.  No changes in body lotion, soaps or cosmetics. 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   AMA (advanced maternal age) multigravida 35+, first trimester 06/07/2019   Supervision of high risk pregnancy, antepartum 05/24/2019   Vitamin D deficiency 07/25/2018   History of cesarean delivery 07/21/2018   Chronic hypertension during pregnancy, antepartum 07/21/2018   Previous gastric bypass affecting pregnancy, antepartum 07/21/2018   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   Eczema of both hands 08/16/2014   Obesity 02/11/2012    Past Surgical History:  Procedure Laterality Date   ADENOIDECTOMY     BILATERAL SALPINGECTOMY  2021   with C-Hyst   CESAREAN SECTION N/A 01/30/2019   Procedure: CESAREAN SECTION;  Surgeon: Federico Flake, MD;  [facility]: [facility];   Service: Obstetrics;  Laterality: N/A;   CESAREAN SECTION     2021   DILATION AND CURETTAGE OF UTERUS     DILATION AND EVACUATION N/A 04/01/2014   Procedure: DILATATION AND EVACUATION;  Surgeon: Robley Fries, MD;  Location: [facility];  Service: Gynecology;  Laterality: N/A;   DILATION AND EVACUATION N/A 12/23/2017   Procedure: DILATATION AND EVACUATION;  Surgeon: Willodean Rosenthal, MD;  Location: [facility];  Service: Gynecology;  Laterality: N/A;   GASTRIC BYPASS     TONSILLECTOMY AND ADENOIDECTOMY  1999   TOOTH EXTRACTION N/A 01/30/2021   Procedure: DENTAL RESTORATION/EXTRACTIONS;  Surgeon: Ocie Doyne, DMD;  Location: [facility];  Service: Oral Surgery;  Laterality: N/A;   TOTAL ABDOMINAL HYSTERECTOMY  2021   C-Hyst    OB History     Gravida  5   Para  2   Term  1   Preterm  1   AB  3   Living  2      SAB  3   IAB  0   Ectopic  0   Multiple  0   Live Births  2            Home Medications    Prior to Admission medications   Medication Sig Start Date End Date Taking? Authorizing Provider  clotrimazole-betamethasone (LOTRISONE) cream Apply to affected area 2 times daily prn  06/20/23  Yes Kimanh Templeman, Britta Mccreedy, MD  hydrOXYzine (ATARAX) 25 MG tablet Take 1 tablet (25 mg total) by mouth every 8 (eight) hours as needed. 06/20/23  Yes Lakshmi Sundeen, Britta Mccreedy, MD  acetaminophen (TYLENOL) 325 MG tablet Take 325 mg by mouth every 6 (six) hours as needed for moderate pain.    [provider]  Blood Pressure Monitoring (BLOOD PRESSURE KIT) DEVI 1 Device by Does not apply route See admin instructions. Blood pressure cuff 05/24/19   [place] Bing, MD  cholecalciferol (VITAMIN D3) 25 MCG (1000 UNIT) tablet Take 1,000 Units by mouth daily.    [provider]  Cyanocobalamin (B-12) 1000 MCG SUBL Place 1,000 mcg under the tongue daily. Liquid    [provider]  Multiple Vitamins-Minerals (MULTIVITAMIN WITH MINERALS) tablet Take 1 tablet by mouth daily.     [provider]    Family History Family History  Problem Relation Age of Onset   Diabetes Mother    Hyperlipidemia Mother    Hypertension Mother    Diabetes Father    Kidney disease Father    Hypertension Father    Hyperlipidemia Father    Cancer Father        pancreatic   Gestational diabetes Sister    Depression Sister    Diabetes Sister    Heart disease Maternal Grandmother    Heart disease Paternal Grandmother    Breast cancer Neg Hx     Social History Social History   Tobacco Use   Smoking status: Never   Smokeless tobacco: Never  Vaping Use   Vaping status: Never Used  Substance Use Topics   Alcohol use: Yes    Comment: occasional beer   Drug use: No     Allergies   Aspirin, Other, and Penicillins   Review of Systems Review of Systems As per HPI  Physical Exam Triage Vital Signs ED Triage Vitals  Encounter Vitals Group     BP 06/20/23 1418 114/76     Systolic BP Percentile --      Diastolic BP Percentile --      Pulse Rate 06/20/23 1418 63     Resp 06/20/23 1418 16     Temp 06/20/23 1418 98.4 F (36.9 C)     Temp Source 06/20/23 1418 Oral     SpO2 06/20/23 1418 98 %     Weight 06/20/23 1418 260 lb (117.9 kg)     Height 06/20/23 1418 5\' 9"  (1.753 m)     Head Circumference --      Peak Flow --      Pain Score 06/20/23 1417 6     Pain Loc --      Pain Education --      Exclude from Growth Chart --    No data found.  Updated Vital Signs BP 114/76 (BP Location: Right Arm)   Pulse 63   Temp 98.4 F (36.9 C) (Oral)   Resp 16   Ht 5\' 9"  (1.753 m)   Wt 117.9 kg   LMP 03/14/2019 (Within Days)   SpO2 98%   BMI 38.40 kg/m   Visual Acuity Right Eye Distance:   Left Eye Distance:   Bilateral Distance:    Right Eye Near:   Left Eye Near:    Bilateral Near:     Physical Exam Vitals and nursing note reviewed.  Constitutional:      General: She is not in acute distress.    Appearance: She is not ill-appearing.  Skin:     Comments: Eczematous rash over both hands.  No discharge, pus collection or erythema.  Neurological:     Mental Status: She is alert.      UC Treatments / Results  Labs (all labs ordered are listed, but only abnormal results are displayed) Labs Reviewed - No data to display  EKG   Radiology No results found.  Procedures Procedures (including critical care time)  Medications Ordered in UC Medications  dexamethasone (DECADRON) injection 10 mg (has no administration in time range)    Initial Impression / Assessment and Plan / UC Course  I have reviewed the triage vital signs and the nursing notes.  Pertinent labs & imaging results that were available during my care of the patient were reviewed by me and considered in my medical decision making (see chart for details).     1.  Eczematous dermatitis flareup involving both hands: Decadron 10 mg IM x 1 dose Betamethasone-clotrimazole cream twice daily until rash resolves Return precautions given. Final Clinical Impressions(s) / UC Diagnoses   Final diagnoses:  Contact eczematous dermatitis     Discharge Instructions      Please use hand cream as recommended Take prescribed medications as directed If you have worsening symptoms please return to urgent care to be reevaluated    ED Prescriptions     Medication Sig Dispense Auth. Provider   triamcinolone cream (KENALOG) 0.1 %  (Status: Discontinued) Apply 1 Application topically 2 (two) times daily. 30 g Joanmarie Tsang, Britta Mccreedy, MD   hydrOXYzine (ATARAX) 25 MG tablet Take 1 tablet (25 mg total) by mouth every 8 (eight) hours as needed. 21 tablet Scarlett Portlock, Britta Mccreedy, MD   clotrimazole-betamethasone (LOTRISONE) cream Apply to affected area 2 times daily prn 15 g Clarke Amburn, Britta Mccreedy, MD      PDMP not reviewed this encounter.   Merrilee Jansky, MD 06/20/23 1524    Merrilee Jansky, MD 06/20/23 [phone]

## 2023-11-29 ENCOUNTER — Encounter (HOSPITAL_COMMUNITY): Payer: Self-pay

## 2023-12-19 ENCOUNTER — Ambulatory Visit (HOSPITAL_COMMUNITY)
Admission: RE | Admit: 2023-12-19 | Discharge: 2023-12-19 | Disposition: A | Discharge status: Home / Self Care | Payer: Medicaid Other | Source: Ambulatory Visit | Attending: Family Medicine | Admitting: Family Medicine

## 2023-12-19 ENCOUNTER — Encounter (HOSPITAL_COMMUNITY): Payer: Self-pay

## 2023-12-19 VITALS — BP 124/85 | HR 67 | Temp 98.2°F | Resp 16

## 2023-12-19 DIAGNOSIS — L309 Dermatitis, unspecified: Secondary | ICD-10-CM

## 2023-12-19 MED ORDER — CLOBETASOL PROPIONATE 0.05 % EX CREA
1.0000 | TOPICAL_CREAM | Freq: Two times a day (BID) | CUTANEOUS | 0 refills | Status: AC
Start: 2023-12-19 — End: ?

## 2023-12-19 MED ORDER — HYDROXYZINE HCL 25 MG PO TABS: 25.0000 mg | ORAL_TABLET | Freq: Three times a day (TID) | ORAL | 0 refills | Status: AC | PRN

## 2023-12-19 MED ORDER — DEXAMETHASONE SODIUM PHOSPHATE 10 MG/ML IJ SOLN
INTRAMUSCULAR | Status: AC
  Filled 2023-12-19: qty 1

## 2023-12-19 MED ORDER — DEXAMETHASONE SODIUM PHOSPHATE 10 MG/ML IJ SOLN
10.0000 mg | Freq: Once | INTRAMUSCULAR | Status: DC
Start: 2023-12-19 — End: 2023-12-19

## 2023-12-19 MED ORDER — DEXAMETHASONE SODIUM PHOSPHATE 10 MG/ML IJ SOLN
10.0000 mg | Freq: Once | INTRAMUSCULAR | Status: AC
  Administered 2023-12-19: 10 mg via INTRAMUSCULAR

## 2023-12-19 MED ORDER — PREDNISONE 20 MG PO TABS: 40.0000 mg | ORAL_TABLET | Freq: Every day | ORAL | 0 refills | Status: AC

## 2023-12-19 NOTE — ED Provider Notes (Signed)
 [facility]    CSN: [id number] Arrival date & time: 12/19/23  1145      History   Chief Complaint Chief Complaint  Patient presents with   Appointment    HPI Vanessa Braun is a 43 y.o. female.   Patient is here for a flare of eczema for several week.  Located on her hands, arms, neck.  She has been using benadryl and otc creams without much help.  She has been given clobetasol in the past with help, but has been out.    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   AMA (advanced maternal age) multigravida 35+, first trimester 06/07/2019   Supervision of high risk pregnancy, antepartum 05/24/2019   Vitamin D deficiency 07/25/2018   History of cesarean delivery 07/21/2018   Chronic hypertension during pregnancy, antepartum 07/21/2018   Previous gastric bypass affecting pregnancy, antepartum 07/21/2018   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   Eczema of both hands 08/16/2014   Obesity 02/11/2012    Past Surgical History:  Procedure Laterality Date   ADENOIDECTOMY     BILATERAL SALPINGECTOMY  2021   with C-Hyst   CESAREAN SECTION N/A 01/30/2019   Procedure: CESAREAN SECTION;  Surgeon: Federico Flake, MD;  Location: [facility];  Service: Obstetrics;  Laterality: N/A;   CESAREAN SECTION     2021   DILATION AND CURETTAGE OF UTERUS     DILATION AND EVACUATION N/A 04/01/2014   Procedure: DILATATION AND  EVACUATION;  Surgeon: Robley Fries, MD;  Location: [facility];  Service: Gynecology;  Laterality: N/A;   DILATION AND EVACUATION N/A 12/23/2017   Procedure: DILATATION AND EVACUATION;  Surgeon: Willodean Rosenthal, MD;  Location: [facility];  Service: Gynecology;  Laterality: N/A;   GASTRIC BYPASS     TONSILLECTOMY AND ADENOIDECTOMY  1999   TOOTH EXTRACTION N/A 01/30/2021   Procedure: DENTAL RESTORATION/EXTRACTIONS;  Surgeon: Ocie Doyne, DMD;  Location: [facility];  Service: Oral Surgery;  Laterality: N/A;   TOTAL ABDOMINAL HYSTERECTOMY  2021   C-Hyst    OB History     Gravida  5   Para  2   Term  1   Preterm  1   AB  3   Living  2      SAB  3   IAB  0   Ectopic  0   Multiple      Live Births  2            Home Medications    Prior to Admission medications   Medication Sig Start Date End Date Taking? Authorizing Provider  acetaminophen (TYLENOL) 325 MG tablet Take 325 mg by mouth every 6 (six) hours as needed for moderate pain.    [provider]  Blood Pressure Monitoring (BLOOD PRESSURE KIT) DEVI 1 Device by Does  not apply route See admin instructions. Blood pressure cuff 05/24/19   [place] Bing, MD  cholecalciferol (VITAMIN D3) 25 MCG (1000 UNIT) tablet Take 1,000 Units by mouth daily.    [provider]  clotrimazole-betamethasone (LOTRISONE) cream Apply to affected area 2 times daily prn 06/20/23   Lamptey, Britta Mccreedy, MD  Cyanocobalamin (B-12) 1000 MCG SUBL Place 1,000 mcg under the tongue daily. Liquid    [provider]  hydrOXYzine (ATARAX) 25 MG tablet Take 1 tablet (25 mg total) by mouth every 8 (eight) hours as needed. 06/20/23   LampteyBritta Mccreedy, MD  Multiple Vitamins-Minerals (MULTIVITAMIN WITH MINERALS) tablet Take 1 tablet by mouth daily.    [provider]    Family History Family History  Problem Relation Age of Onset   Diabetes Mother    Hyperlipidemia Mother    Hypertension Mother    Diabetes Father     Kidney disease Father    Hypertension Father    Hyperlipidemia Father    Cancer Father        pancreatic   Gestational diabetes Sister    Depression Sister    Diabetes Sister    Heart disease Maternal Grandmother    Heart disease Paternal Grandmother    Breast cancer Neg Hx     Social History Social History   Tobacco Use   Smoking status: Never   Smokeless tobacco: Never  Vaping Use   Vaping status: Never Used  Substance Use Topics   Alcohol use: Yes    Comment: occasional beer   Drug use: No     Allergies   Aspirin, Other, and Penicillins   Review of Systems Review of Systems  Constitutional: Negative.   HENT: Negative.    Respiratory: Negative.    Gastrointestinal: Negative.   Genitourinary: Negative.   Musculoskeletal: Negative.   Skin:  Positive for rash.     Physical Exam Triage Vital Signs ED Triage Vitals  Encounter Vitals Group     BP 12/19/23 1220 124/85     Systolic BP Percentile --      Diastolic BP Percentile --      Pulse Rate 12/19/23 1220 67     Resp 12/19/23 1220 16     Temp 12/19/23 1220 98.2 F (36.8 C)     Temp Source 12/19/23 1220 Oral     SpO2 12/19/23 1220 98 %     Weight --      Height --      Head Circumference --      Peak Flow --      Pain Score 12/19/23 1219 0     Pain Loc --      Pain Education --      Exclude from Growth Chart --    No data found.  Updated Vital Signs BP 124/85 (BP Location: Right Arm)   Pulse 67   Temp 98.2 F (36.8 C) (Oral)   Resp 16   LMP 03/14/2019 (Within Days)   SpO2 98%   Visual Acuity Right Eye Distance:   Left Eye Distance:   Bilateral Distance:    Right Eye Near:   Left Eye Near:    Bilateral Near:     Physical Exam Constitutional:      Appearance: Normal appearance. She is normal weight.  Skin:    Comments: Dry, cracked, thickened skin to the hands, slightly upper the arms;  there are open wounds to the palms of the hands, right is worse than left  Neurological:      General: No focal deficit present.     Mental Status: She is alert.  Psychiatric:        Mood and Affect: Mood normal.      UC Treatments / Results  Labs (all labs ordered are listed, but only abnormal results are displayed) Labs Reviewed - No data to display  EKG   Radiology No results found.  Procedures Procedures (including critical care time)  Medications Ordered in UC Medications  dexamethasone (DECADRON) injection 10 mg (has no administration in time range)    Initial Impression / Assessment and Plan / UC Course  I have reviewed the triage vital signs and the nursing notes.  Pertinent labs & imaging results that were available during my care of the patient were reviewed by me and considered in my medical decision making (see chart for details).    Final Clinical Impressions(s) / UC Diagnoses   Final diagnoses:  Eczema, unspecified type     Discharge Instructions      You were seen today for a flare of your eczema.  You were given an injection of a steroid while here today.  I have sent out a steroid cream, a pill for the itch, as well as oral prednisone.  Please start the prednisone tomorrow.  Follow up with your dermatologist as planned.     ED Prescriptions     Medication Sig Dispense Auth. Provider   hydrOXYzine (ATARAX) 25 MG tablet Take 1 tablet (25 mg total) by mouth every 8 (eight) hours as needed. 21 tablet Kameo Bains, MD   clobetasol cream (TEMOVATE) 0.05 % Apply 1 Application topically 2 (two) times daily. 30 g Shea Swalley, MD   predniSONE (DELTASONE) 20 MG tablet Take 2 tablets (40 mg total) by mouth daily for 5 days. 10 tablet Jannifer Franklin, MD      PDMP not reviewed this encounter.   Jannifer Franklin, MD 12/19/23 [phone]

## 2023-12-19 NOTE — ED Triage Notes (Signed)
 Pt reports eczema outbreak all over for a couple weeks. Took Benadryl and different lotions without relief.

## 2023-12-19 NOTE — Discharge Instructions (Addendum)
 You were seen today for a flare of your eczema.  You were given an injection of a steroid while here today.  I have sent out a steroid cream, a pill for the itch, as well as oral prednisone.  Please start the prednisone tomorrow.  Follow up with your dermatologist as planned.

## 2024-04-22 ENCOUNTER — Ambulatory Visit (HOSPITAL_COMMUNITY)

## 2024-04-24 ENCOUNTER — Encounter (HOSPITAL_COMMUNITY): Payer: Self-pay

## 2024-04-24 ENCOUNTER — Ambulatory Visit (HOSPITAL_COMMUNITY)
Admission: EM | Admit: 2024-04-24 | Discharge: 2024-04-24 | Disposition: A | Discharge status: Home / Self Care | Attending: Emergency Medicine | Admitting: Emergency Medicine

## 2024-04-24 DIAGNOSIS — L259 Unspecified contact dermatitis, unspecified cause: Secondary | ICD-10-CM | POA: Diagnosis not present

## 2024-04-24 MED ORDER — PREDNISONE 10 MG (21) PO TBPK
ORAL_TABLET | Freq: Every day | ORAL | 0 refills | Status: AC
Start: 2024-04-24 — End: ?

## 2024-04-24 MED ORDER — METHYLPREDNISOLONE SODIUM SUCC 125 MG IJ SOLR
INTRAMUSCULAR | Status: AC
  Filled 2024-04-24: qty 2

## 2024-04-24 MED ORDER — METHYLPREDNISOLONE SODIUM SUCC 125 MG IJ SOLR
80.0000 mg | Freq: Once | INTRAMUSCULAR | Status: AC
  Administered 2024-04-24: 80 mg via INTRAMUSCULAR

## 2024-04-24 MED ORDER — CLOTRIMAZOLE-BETAMETHASONE 1-0.05 % EX CREA: TOPICAL_CREAM | CUTANEOUS | 0 refills | Status: AC

## 2024-04-24 NOTE — ED Notes (Signed)
 Pt c/o eczema to both hands and neck x1wk. Pt c/o pain and bleeding at times.

## 2024-04-24 NOTE — Discharge Instructions (Signed)
 Patient will need to also apply A&E ointment and wear gloves at nighttime Apply cream twice a day to hands Avoid any new lotions soaps or perfumes based items you will need to follow-up with dermatology

## 2024-04-24 NOTE — ED Provider Notes (Signed)
 [facility]    CSN: [id number] Arrival date & time: 04/24/24  1025      History   Chief Complaint Chief Complaint  Patient presents with   Eczema    HPI Vanessa Braun is a 43 y.o. female.   Patient presents today with complaints of eczema to bilateral hands.  Patient states this is chronic it becomes worse when she uses hand sanitizer.  Has been treated for this multiple times.  Patient also deals with vitiligo skin issues.  Patient is requesting a prednisone  pack and topical cream which has helped in the past.  Patient has tried several at home remedies that have not helped.  Denies any new soaps lotions.     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   AMA (advanced maternal age) multigravida 35+, first trimester 06/07/2019   Supervision of high risk pregnancy, antepartum 05/24/2019   Vitamin D  deficiency 07/25/2018   History of cesarean delivery 07/21/2018   Chronic hypertension during pregnancy, antepartum 07/21/2018   Previous gastric bypass affecting pregnancy, antepartum 07/21/2018   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   Eczema of both hands 08/16/2014   Obesity 02/11/2012    Past Surgical History:  Procedure Laterality Date   ADENOIDECTOMY     BILATERAL SALPINGECTOMY  2021   with C-Hyst   CESAREAN SECTION N/A 01/30/2019   Procedure: CESAREAN SECTION;  Surgeon: Eldonna Suzen Octave, MD;  Location:  [facility];  Service: Obstetrics;  Laterality: N/A;   CESAREAN SECTION     2021   DILATION AND CURETTAGE OF UTERUS     DILATION AND EVACUATION N/A 04/01/2014   Procedure: DILATATION AND EVACUATION;  Surgeon: Robbi JONELLE Render, MD;  Location: [facility];  Service: Gynecology;  Laterality: N/A;   DILATION AND EVACUATION N/A 12/23/2017   Procedure: DILATATION AND EVACUATION;  Surgeon: Corene Coy, MD;  Location: [facility];  Service: Gynecology;  Laterality: N/A;   GASTRIC BYPASS     TONSILLECTOMY AND ADENOIDECTOMY  1999   TOOTH EXTRACTION N/A 01/30/2021   Procedure: DENTAL RESTORATION/EXTRACTIONS;  Surgeon: Sheryle Hamilton, DMD;  Location: [facility];  Service: Oral Surgery;  Laterality: N/A;   TOTAL ABDOMINAL HYSTERECTOMY  2021   C-Hyst    OB History     Gravida  5   Para  2   Term  1   Preterm  1   AB  3   Living  2      SAB  3   IAB  0   Ectopic  0   Multiple      Live Births  2            Home Medications    Prior to Admission medications   Medication Sig Start Date End Date Taking? Authorizing Provider  clotrimazole -betamethasone  (LOTRISONE ) cream Apply to affected area  2 times daily prn 04/24/24  Yes Moyinoluwa Dawe L, NP  predniSONE  (STERAPRED UNI-PAK 21 TAB) 10 MG (21) TBPK tablet Take by mouth daily. Take 6 tabs by mouth daily  for 2 days, then 5 tabs for 2 days, then 4 tabs for 2 days, then 3 tabs for 2 days, 2 tabs for 2 days, then 1 tab by mouth daily for 2 days 04/24/24  Yes Merilee Andrea CROME, NP  Blood Pressure Monitoring (BLOOD PRESSURE KIT) DEVI 1 Device by Does not apply route See admin instructions. Blood pressure cuff 05/24/19   Izell Harari, MD  cholecalciferol (VITAMIN D3) 25 MCG (1000 UNIT) tablet Take 1,000 Units by mouth daily.    [provider]  clobetasol  cream (TEMOVATE ) 0.05 % Apply 1 Application topically 2 (two) times daily. 12/19/23   Piontek, Rocky, MD  hydrOXYzine  (ATARAX ) 25 MG tablet Take 1 tablet (25 mg total) by mouth  every 8 (eight) hours as needed. 12/19/23   Piontek, Rocky, MD  Multiple Vitamins-Minerals (MULTIVITAMIN WITH MINERALS) tablet Take 1 tablet by mouth daily.    [provider]    Family History Family History  Problem Relation Age of Onset   Diabetes Mother    Hyperlipidemia Mother    Hypertension Mother    Diabetes Father    Kidney disease Father    Hypertension Father    Hyperlipidemia Father    Cancer Father        pancreatic   Gestational diabetes Sister    Depression Sister    Diabetes Sister    Heart disease Maternal Grandmother    Heart disease Paternal Grandmother    Breast cancer Neg Hx     Social History Social History   Tobacco Use   Smoking status: Never   Smokeless tobacco: Never  Vaping Use   Vaping status: Never Used  Substance Use Topics   Alcohol use: Yes    Comment: occasional beer   Drug use: No     Allergies   Aspirin , Other, and Penicillins   Review of Systems Review of Systems  Respiratory: Negative.    Cardiovascular: Negative.   Gastrointestinal: Negative.   Genitourinary: Negative.   Skin:  Positive for rash.       Bilateral hands itchy peeling some splitting and drying patchy areas  Neurological: Negative.      Physical Exam Triage Vital Signs ED Triage Vitals  Encounter Vitals Group     BP 04/24/24 1059 128/83     Girls Systolic BP Percentile --      Girls Diastolic BP Percentile --      Boys Systolic BP Percentile --      Boys Diastolic BP Percentile --      Pulse Rate 04/24/24 1059 60     Resp 04/24/24 1059 18     Temp 04/24/24 1059 98 F (36.7 C)     Temp Source 04/24/24 1059 Oral     SpO2 04/24/24 1059 98 %     Weight --      Height --      Head Circumference --      Peak Flow --      Pain Score 04/24/24 1101 6     Pain Loc --      Pain Education --      Exclude from Growth Chart --    No data found.  Updated Vital Signs BP 128/83 (BP Location: Right Arm)   Pulse 60   Temp 98 F (36.7 C) (  Oral)    Resp 18   LMP 03/14/2019 (Within Days)   SpO2 98%   Breastfeeding No   Visual Acuity Right Eye Distance:   Left Eye Distance:   Bilateral Distance:    Right Eye Near:   Left Eye Near:    Bilateral Near:     Physical Exam Constitutional:      Appearance: Normal appearance.   Cardiovascular:     Rate and Rhythm: Normal rate.     Pulses: Normal pulses.  Pulmonary:     Effort: Pulmonary effort is normal.  Abdominal:     General: Abdomen is flat.   Skin:    Findings: Rash present.     Comments: Bilateral hands itchy peeling some splitting and drying patchy areas.  Patient also has a history of vitiligo to the skin bilateral arms head and neck    Neurological:     Mental Status: She is alert.      UC Treatments / Results  Labs (all labs ordered are listed, but only abnormal results are displayed) Labs Reviewed - No data to display  EKG   Radiology No results found.  Procedures Procedures (including critical care time)  Medications Ordered in UC Medications  methylPREDNISolone  sodium succinate (SOLU-MEDROL ) 125 mg/2 mL injection 80 mg (has no administration in time range)    Initial Impression / Assessment and Plan / UC Course  I have reviewed the triage vital signs and the nursing notes.  Pertinent labs & imaging results that were available during my care of the patient were reviewed by me and considered in my medical decision making (see chart for details).     Patient will need to also apply A&E ointment and wear gloves at nighttime Apply cream twice a day to hands Avoid any new lotions soaps or perfumes based items you will need to follow-up with dermatology  Final Clinical Impressions(s) / UC Diagnoses   Final diagnoses:  Contact dermatitis and eczema     Discharge Instructions      Patient will need to also apply A&E ointment and wear gloves at nighttime Apply cream twice a day to hands Avoid any new lotions soaps or perfumes based  items you will need to follow-up with dermatology     ED Prescriptions     Medication Sig Dispense Auth. Provider   predniSONE  (STERAPRED UNI-PAK 21 TAB) 10 MG (21) TBPK tablet Take by mouth daily. Take 6 tabs by mouth daily  for 2 days, then 5 tabs for 2 days, then 4 tabs for 2 days, then 3 tabs for 2 days, 2 tabs for 2 days, then 1 tab by mouth daily for 2 days 42 tablet Merilee Hollering L, NP   clotrimazole -betamethasone  (LOTRISONE ) cream Apply to affected area 2 times daily prn 15 g Merilee Hollering CROME, NP      PDMP not reviewed this encounter.   Merilee Hollering CROME, NP 04/24/24 1201
